# Patient Record
Sex: Female | Born: 1945 | Race: White | Hispanic: No | Smoking: Never smoker
Health system: Southern US, Community
[De-identification: ages and names within clinical notes are randomized; demographics above are authoritative.]

## PROBLEM LIST (undated history)

## (undated) DIAGNOSIS — E119 Type 2 diabetes mellitus without complications: Secondary | ICD-10-CM

## (undated) DIAGNOSIS — I10 Essential (primary) hypertension: Secondary | ICD-10-CM

## (undated) DIAGNOSIS — M1990 Unspecified osteoarthritis, unspecified site: Secondary | ICD-10-CM

## (undated) DIAGNOSIS — E785 Hyperlipidemia, unspecified: Secondary | ICD-10-CM

## (undated) HISTORY — DX: Essential (primary) hypertension: I10

## (undated) HISTORY — PX: OTHER SURGICAL HISTORY: SHX169

---

## 2000-08-06 ENCOUNTER — Encounter: Payer: Self-pay | Admitting: Emergency Medicine

## 2000-08-06 ENCOUNTER — Emergency Department (HOSPITAL_COMMUNITY): Admission: EM | Admit: 2000-08-06 | Discharge: 2000-08-06 | Payer: Self-pay

## 2005-09-02 LAB — CONVERTED CEMR LAB: Pap Smear: NORMAL

## 2008-11-18 ENCOUNTER — Encounter: Payer: Self-pay | Admitting: Family Medicine

## 2008-12-12 ENCOUNTER — Ambulatory Visit: Payer: Self-pay | Admitting: Family Medicine

## 2008-12-12 DIAGNOSIS — I10 Essential (primary) hypertension: Secondary | ICD-10-CM | POA: Insufficient documentation

## 2008-12-12 DIAGNOSIS — R9389 Abnormal findings on diagnostic imaging of other specified body structures: Secondary | ICD-10-CM

## 2008-12-13 ENCOUNTER — Telehealth: Payer: Self-pay | Admitting: Family Medicine

## 2008-12-19 ENCOUNTER — Encounter: Admission: RE | Admit: 2008-12-19 | Discharge: 2008-12-19 | Payer: Self-pay | Admitting: Family Medicine

## 2009-05-03 ENCOUNTER — Telehealth: Payer: Self-pay | Admitting: Family Medicine

## 2009-07-20 ENCOUNTER — Telehealth: Payer: Self-pay | Admitting: Family Medicine

## 2010-03-02 ENCOUNTER — Telehealth: Payer: Self-pay | Admitting: Family Medicine

## 2010-04-04 ENCOUNTER — Telehealth: Payer: Self-pay | Admitting: Family Medicine

## 2010-04-20 ENCOUNTER — Telehealth: Payer: Self-pay | Admitting: Family Medicine

## 2010-07-17 ENCOUNTER — Ambulatory Visit: Payer: Self-pay | Admitting: Family Medicine

## 2010-08-10 ENCOUNTER — Telehealth: Payer: Self-pay | Admitting: Family Medicine

## 2010-10-02 NOTE — Progress Notes (Signed)
Summary: Pt requesting RX, pt needs OV or new [facility]oug[facility] in [place], [place]  Phone Note Call from Patient   Caller: Patient Call For: Evelena Peat MD Summary of Call: VM from pt C/O congestion, ears stopped up, denies fever.  Concerned this will turn into sinus infection again.  Pt was with sick greandson last week who was on antibiotics.  Pt works in [place], requesting suggestions, Z pack. Dionisio Paschal [place] Initial call taken by: Sid Falcon LPN,  April 20, 2010 4:05 PM  Follow-up for Phone Call        may refil Z-max (Z-pack) take as directed but pt must have f/u before further meds -not seen in > one year. Follow-up by: Evelena Peat MD,  April 20, 2010 5:29 PM  Additional Follow-up for Phone Call Additional follow up Details #1::        Rx sent, per Dr Caryl Never, will fill this time, need to be seen next time in office, or get a Jeremih Dearmas in [facility] Additional Follow-up by: Sid Falcon LPN,  April 20, 2010 5:34 PM    New/Updated Medications: ZITHROMAX 1 GM PACK (AZITHROMYCIN) use as directed Prescriptions: ZITHROMAX 1 GM PACK (AZITHROMYCIN) use as directed  #1 x 0   Entered by:   Sid Falcon LPN   Authorized by:   Evelena Peat MD   Signed by:   Sid Falcon LPN on 04/54/0981   Method used:   Electronically to        [id number]* (retail)       [address]       [place], [place]  [address]       Ph: [phone]       Fax: [phone]   RxID:   [id number]

## 2010-10-02 NOTE — Progress Notes (Signed)
Summary: new med refill - when avalike dc  Phone Note Call from Patient Call back at Home Phone [phone]   Caller: vm 4:54 Summary of Call: The new medicine worked fine.  3 mo supply to [place], [facility] as in file.  Please have Harriett Sine call me back.  Calling to confirm that I'm going to continue with it.    Initial call taken by: Rudy Jew, RN,  April 04, 2010 5:15 PM  Follow-up for Phone Call        OK to refill Losartan HCTZ.  She does need f/u as not seen here in over one year.  OK to refill once. Follow-up by: Evelena Peat MD,  April 04, 2010 5:20 PM  Additional Follow-up for Phone Call Additional follow up Details #1::        Phone Call Completed Additional Follow-up by: Rudy Jew, RN,  April 04, 2010 5:34 PM

## 2010-10-02 NOTE — Progress Notes (Signed)
  Phone Note Call from Patient   Caller: Patient Call For: Evelena Peat MD Summary of Call: [phone] Asking for return call. Confimed dose of meds. Initial call taken by: [facility] CMA AAMA,  August 10, 2010 1:42 PM

## 2010-10-02 NOTE — Progress Notes (Signed)
Summary: [name] discontinued  Phone Note Call from Patient Call back at [facility] Phone [phone]   Caller: Patient Call For: Joanne Peat MD Summary of Call: Pt pharmacy will no longer be able to fill Avalide, discontinued. What else should she take? Phamacy in [place], [place] Initial call taken by: Sid Falcon LPN,  March 02, 3085 2:50 PM    New/Updated Medications: LOSARTAN POTASSIUM-HCTZ 100-12.5 MG TABS (LOSARTAN POTASSIUM-HCTZ) one tab daily Prescriptions: LOSARTAN POTASSIUM-HCTZ 100-12.5 MG TABS (LOSARTAN POTASSIUM-HCTZ) one tab daily  #30 x 3   Entered by:   Sid Falcon LPN   Authorized by:   Joanne Peat MD   Signed by:   Sid Falcon LPN on 57/84/6962   Method used:   Electronically to        [facility] [id number]* (retail)       [address]       [place], [place]  [address]       Ph: [phone]       Fax: [phone]   RxID:   [id number]

## 2010-10-02 NOTE — Assessment & Plan Note (Signed)
Summary: MED CK / REFILL // RS/   Vital Signs:  Patient profile:   65 year old female Weight:      276 pounds Temp:     97.7 degrees F oral BP sitting:   162 / 98  (left arm) Cuff size:   large  Vitals Entered By: Sid Falcon LPN (July 17, 2010 10:54 AM)  Serial Vital Signs/Assessments:  Time      Position  BP       Pulse  Resp  Temp     By                     156/98                         Evelena Peat MD   History of Present Illness: Patient her followup hypertension. Takes losartan HCTZ. Compliant with therapy. Has had some weight gain. Poor dietary compliance. Not exercising. Plans to start Weight Watchers soon.  Hypertension History:      She denies headache, chest pain, palpitations, dyspnea with exertion, orthopnea, PND, peripheral edema, visual symptoms, neurologic problems, syncope, and side effects from treatment.  She notes no problems with any antihypertensive medication side effects.        Positive major cardiovascular risk factors include female age 61 years old or older and hypertension.  Negative major cardiovascular risk factors include non-tobacco-user status.     Allergies (verified): 1)  Penicillin V Potassium (Penicillin V Potassium)  Review of Systems       The patient complains of weight gain.  The patient denies chest pain, syncope, peripheral edema, headaches, and abdominal pain.    Physical Exam  General:  Well-developed,well-nourished,in no acute distress; alert,appropriate and cooperative throughout examination Mouth:  Oral mucosa and oropharynx without lesions or exudates.  Teeth in good repair. Neck:  No deformities, masses, or tenderness noted. Lungs:  Normal respiratory effort, chest expands symmetrically. Lungs are clear to auscultation, no crackles or wheezes. Heart:  normal rate and regular rhythm.   Extremities:  no edema   Impression & Recommendations:  Problem # 1:  HYPERTENSION (ICD-401.9) Assessment Deteriorated long  discussion with patient. Discussed additional medication at this point she wishes to work on weight loss and reassess within the next month or 2. She will start regular walking program Her updated medication list for this problem includes:    Losartan Potassium-hctz 100-12.5 Mg Tabs (Losartan potassium-hctz) ..... One tab daily  Complete Medication List: 1)  Losartan Potassium-hctz 100-12.5 Mg Tabs (Losartan potassium-hctz) .... One tab daily  Hypertension Assessment/Plan:      The patient's hypertensive risk group is category B: At least one risk factor (excluding diabetes) with no target organ damage.  Today's blood pressure is 162/98.    Patient Instructions: 1)  It is important that you exercise reguarly at least 20 minutes 5 times a week. If you develop chest pain, have severe difficulty breathing, or feel very tired, stop exercising immediately and seek medical attention.  2)  You need to lose weight. Consider a lower calorie diet and regular exercise.  3)  Check your  Blood Pressure regularly . If it is above:140/90   you should make an appointment. 4)  Schedule complete physical examination within the next couple of months Prescriptions: LOSARTAN POTASSIUM-HCTZ 100-12.5 MG TABS (LOSARTAN POTASSIUM-HCTZ) one tab daily  #30 x 3   Entered and Authorized by:   Evelena Peat MD   Signed by:  Evelena Peat MD on 07/17/2010   Method used:   Electronically to        [id number]* (retail)       [address]       [place], [place]  [address]       Ph: [phone]       Fax: [phone]   RxID:   [id number]    Orders Added: 1)  Est. Patient Level III [[address]]

## 2010-10-07 IMAGING — US US RENAL
1 series · 14 of 25 positions shown · non-contrast
Comparison: None. Prior CT scan in [place] is not available for
comparison.

CLINICAL DATA: Evaluate right renal cyst.

RENAL/URINARY TRACT ULTRASOUND COMPLETE

[Series 1: us renal · 0.32mm/px · 14 of 42 slices shown]
[im 1/42]
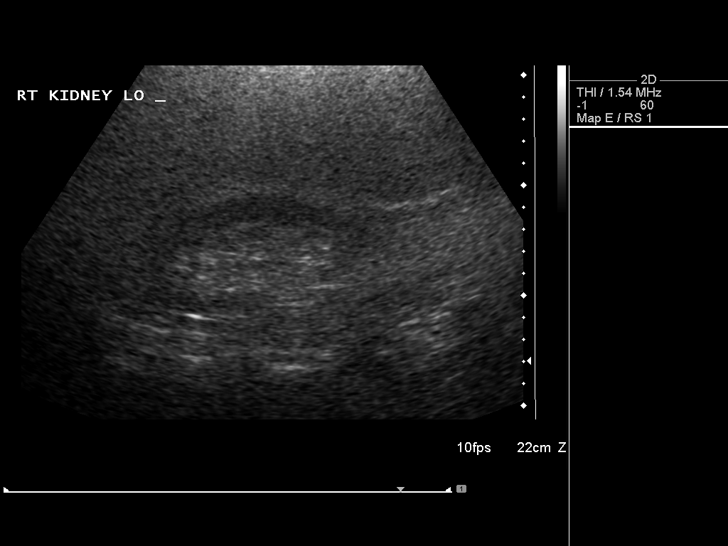
[im 4/42]
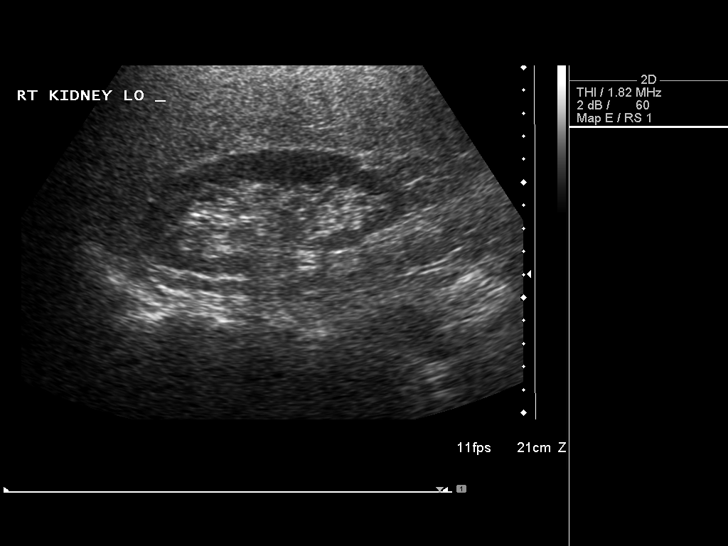
[im 7/42]
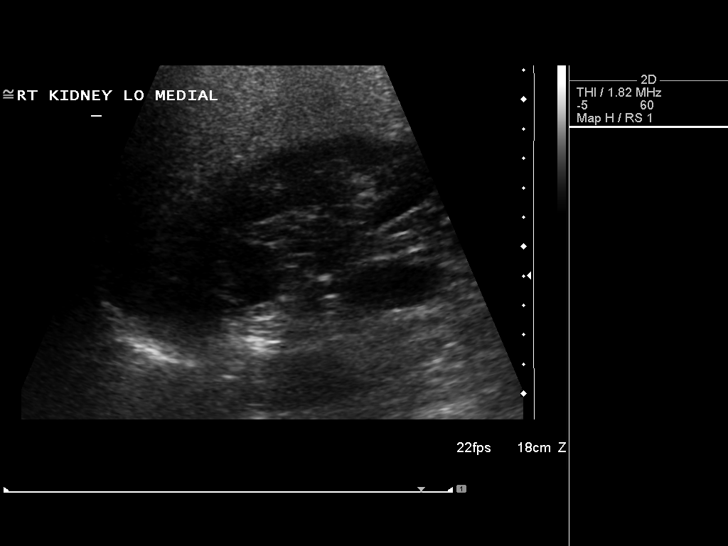
[im 11/42]
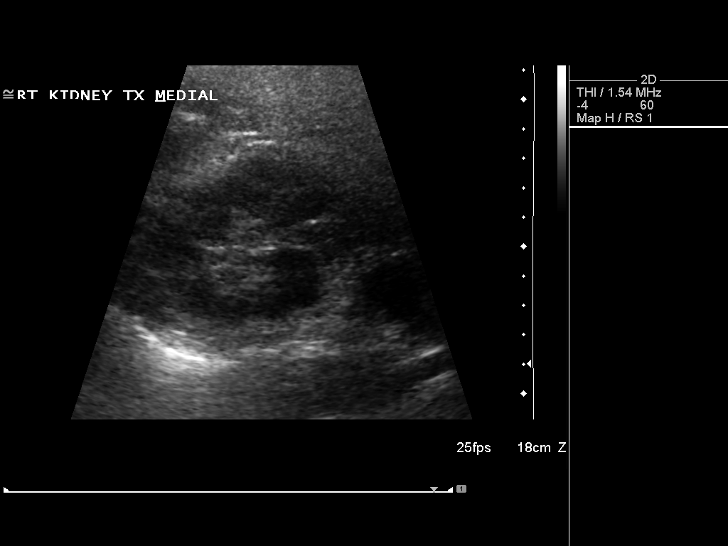
[im 14/42]
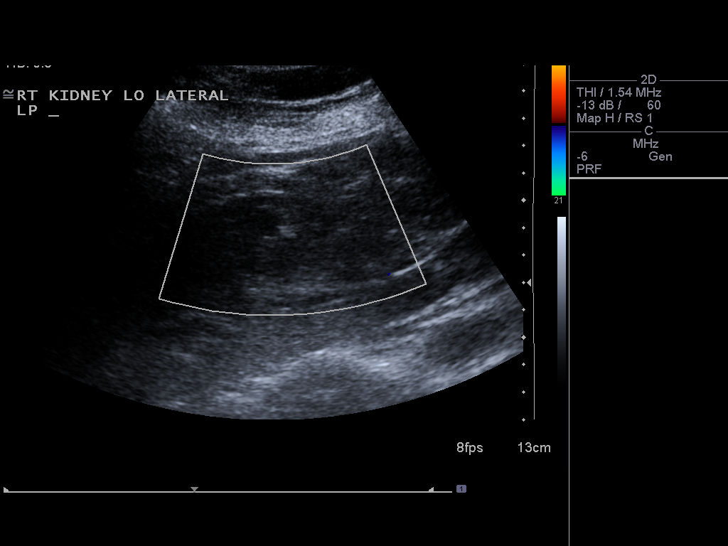
[im 16/42]
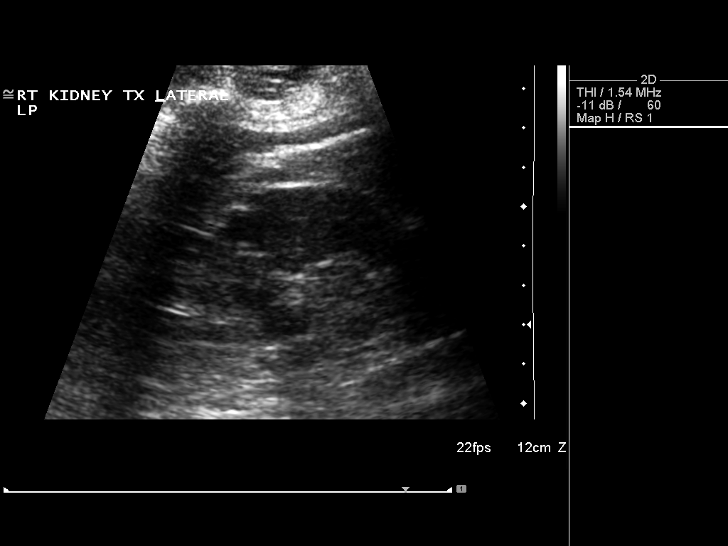
[im 19/42]
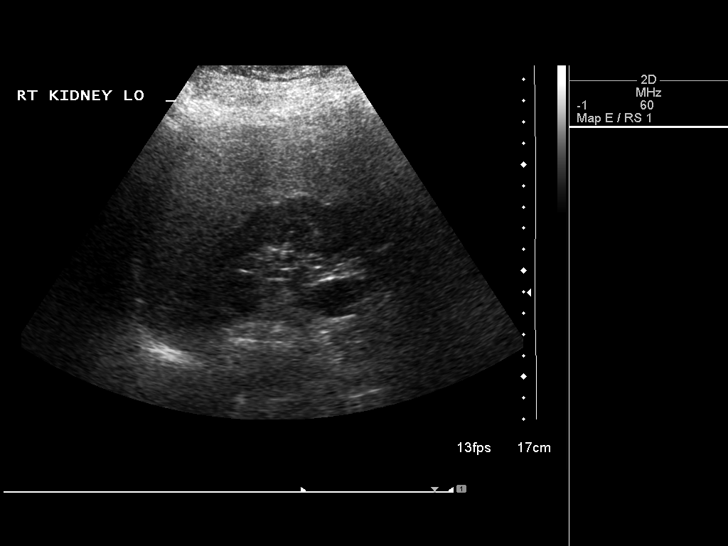
[im 23/42]
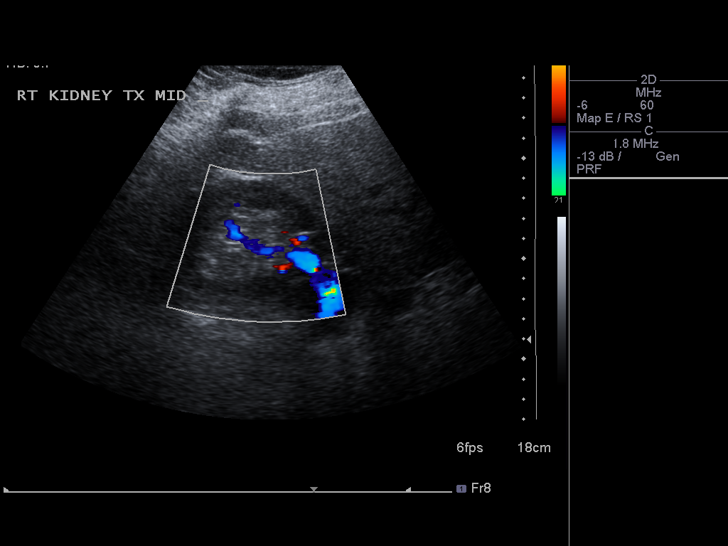
[im 26/42]
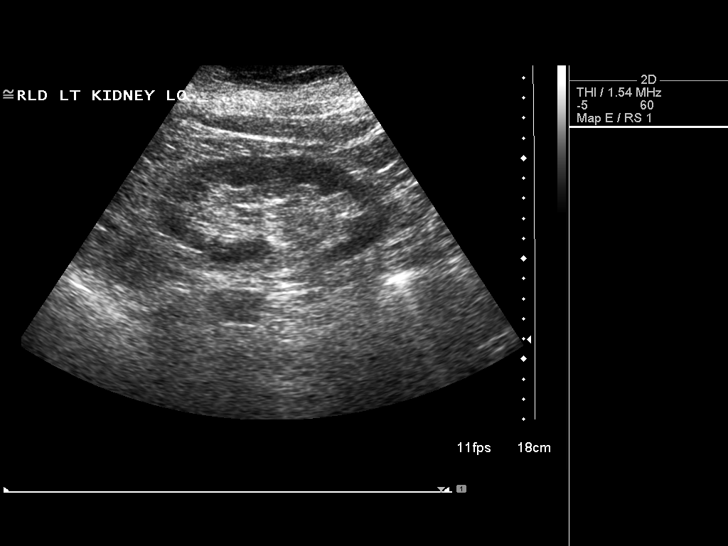
[im 28/42]
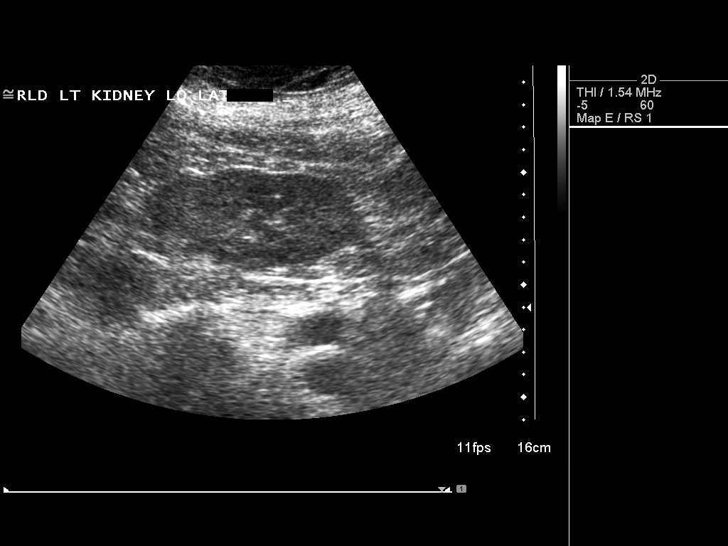
[im 31/42]
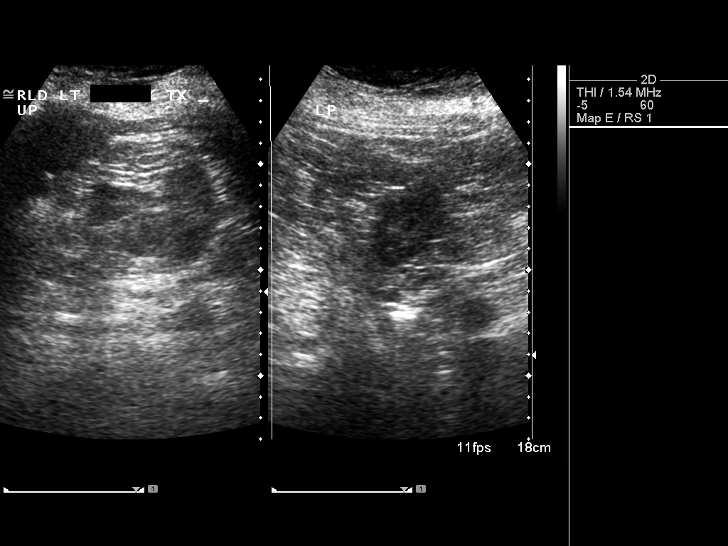
[im 35/42]
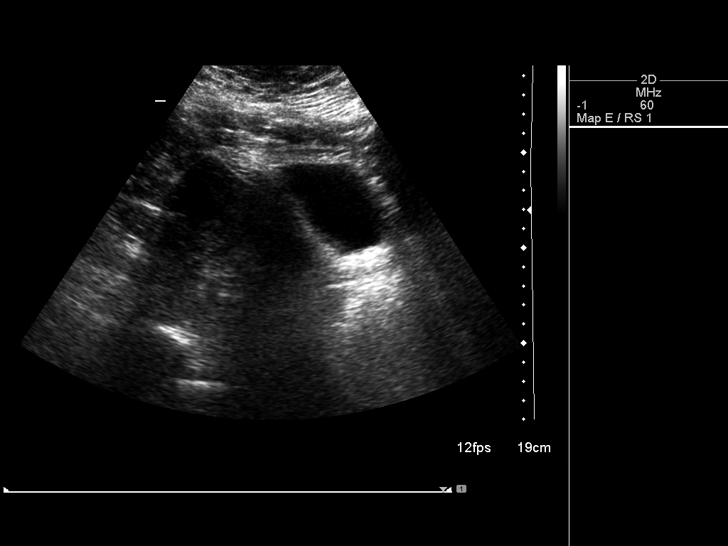
[im 38/42]
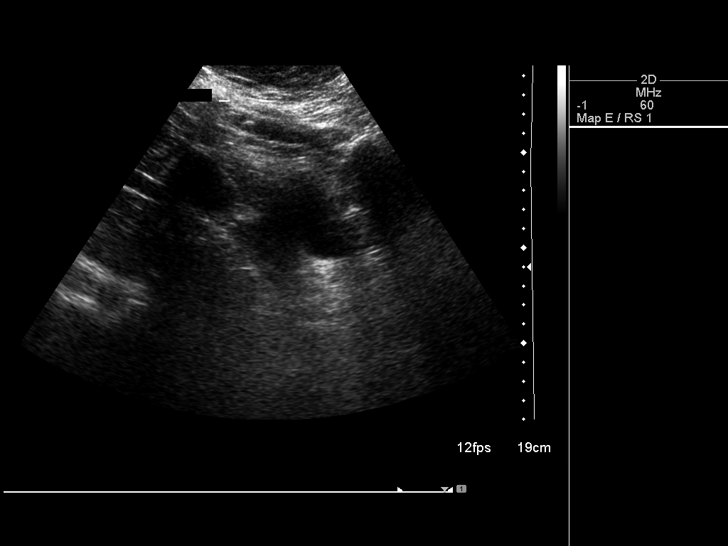
[im 42/42]
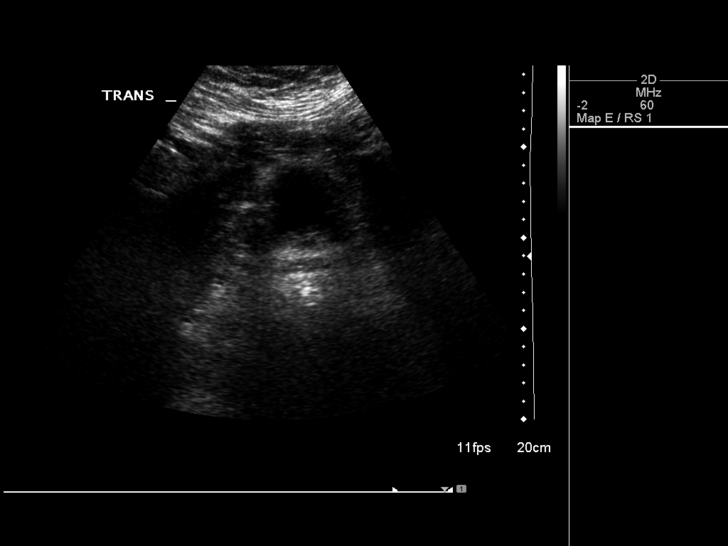

[14 of 25 positions shown; findings below may reference images not displayed]

FINDINGS: Right Kidney:  11.2 cm in length.  There is no hydronephrosis.
Right upper pole cyst measures 16 x 20 mm.  This is not optimally
visualized due to patient size.  There is also a 9 x 11 mm cyst on
the right lower pole.

Left Kidney:  11.6 cm.  There is no hydronephrosis or mass.

Bladder:  Partially full and no significant abnormality.
IMPRESSION: There appear to be two simple cysts  in the right kidney.  These
are not optimally imaged due to patient size.  There is no
hydronephrosis.

## 2010-11-23 ENCOUNTER — Telehealth: Payer: Self-pay | Admitting: *Deleted

## 2010-11-23 MED ORDER — LOSARTAN POTASSIUM-HCTZ 100-12.5 MG PO TABS: 1.0000 | ORAL_TABLET | Freq: Every day | ORAL | Status: DC

## 2010-11-23 NOTE — Telephone Encounter (Signed)
Pt called for refill, has CPX scheduled for next week, filled Losartan X 1 year, pt aware

## 2010-12-27 ENCOUNTER — Other Ambulatory Visit: Payer: Self-pay

## 2011-01-03 ENCOUNTER — Encounter: Payer: Self-pay | Admitting: Family Medicine

## 2011-01-04 ENCOUNTER — Encounter: Payer: Self-pay | Admitting: Family Medicine

## 2012-01-02 ENCOUNTER — Telehealth: Payer: Self-pay | Admitting: Family Medicine

## 2012-01-02 MED ORDER — LOSARTAN POTASSIUM-HCTZ 100-12.5 MG PO TABS: 1.0000 | ORAL_TABLET | Freq: Every day | ORAL | Status: DC

## 2012-01-02 NOTE — Telephone Encounter (Signed)
losartan-hydrochlorothiazide (HYZAAR) 100-12.5 MG per tablet   [id number] - [place], [place] - [address] AT [facility] [address]

## 2012-02-13 ENCOUNTER — Encounter: Payer: Self-pay | Admitting: Family Medicine

## 2012-02-17 ENCOUNTER — Other Ambulatory Visit: Payer: Self-pay

## 2012-02-24 ENCOUNTER — Encounter: Payer: Self-pay | Admitting: Family Medicine

## 2012-03-10 ENCOUNTER — Other Ambulatory Visit: Payer: Self-pay

## 2012-03-16 ENCOUNTER — Encounter: Payer: Self-pay | Admitting: Family Medicine

## 2012-04-09 ENCOUNTER — Other Ambulatory Visit: Payer: Self-pay | Admitting: Family Medicine

## 2012-04-09 MED ORDER — LOSARTAN POTASSIUM-HCTZ 100-12.5 MG PO TABS: 1.0000 | ORAL_TABLET | Freq: Every day | ORAL | Status: DC

## 2012-04-09 NOTE — Telephone Encounter (Signed)
Pt needs new rx sent to [facility] [place] for losartan-hctz 100-12.5mg . Pt no longer use [facility] in [place]

## 2012-05-13 ENCOUNTER — Other Ambulatory Visit: Payer: Self-pay

## 2012-05-20 ENCOUNTER — Encounter: Payer: Self-pay | Admitting: Family Medicine

## 2012-09-14 ENCOUNTER — Other Ambulatory Visit: Payer: Self-pay

## 2012-09-21 ENCOUNTER — Encounter: Payer: Self-pay | Admitting: Family Medicine

## 2012-10-13 ENCOUNTER — Other Ambulatory Visit: Payer: Self-pay

## 2012-10-20 ENCOUNTER — Encounter: Payer: Self-pay | Admitting: Family Medicine

## 2012-10-28 ENCOUNTER — Other Ambulatory Visit: Payer: Self-pay

## 2012-11-04 ENCOUNTER — Encounter: Payer: Self-pay | Admitting: Family Medicine

## 2012-12-22 ENCOUNTER — Other Ambulatory Visit: Payer: Self-pay

## 2012-12-29 ENCOUNTER — Encounter: Payer: Self-pay | Admitting: Family Medicine

## 2013-01-20 ENCOUNTER — Other Ambulatory Visit: Payer: Self-pay

## 2013-01-21 ENCOUNTER — Other Ambulatory Visit: Payer: Self-pay

## 2013-01-27 ENCOUNTER — Encounter: Payer: Self-pay | Admitting: Family Medicine

## 2013-02-02 ENCOUNTER — Telehealth: Payer: Self-pay | Admitting: Family Medicine

## 2013-02-02 MED ORDER — LOSARTAN POTASSIUM-HCTZ 100-12.5 MG PO TABS: 1.0000 | ORAL_TABLET | Freq: Every day | ORAL | Status: DC

## 2013-02-02 NOTE — Telephone Encounter (Signed)
PT called and stated that she will be running out of her losartan-hydrochlorothiazide (HYZAAR) 100-12.5 MG per tablet, prior to her CPX next month. She would like it sent to [facility] in [place], please assist.

## 2013-02-02 NOTE — Telephone Encounter (Signed)
Left detailed message Rx refill was sent to pharmacy.

## 2013-02-03 ENCOUNTER — Other Ambulatory Visit: Payer: Self-pay

## 2013-02-10 ENCOUNTER — Encounter: Payer: Self-pay | Admitting: Family Medicine

## 2013-03-15 ENCOUNTER — Telehealth: Payer: Self-pay | Admitting: Family Medicine

## 2013-03-15 ENCOUNTER — Other Ambulatory Visit (INDEPENDENT_AMBULATORY_CARE_PROVIDER_SITE_OTHER): Payer: BC Managed Care – PPO

## 2013-03-15 DIAGNOSIS — Z0000 Encounter for general adult medical examination without abnormal findings: Secondary | ICD-10-CM

## 2013-03-15 DIAGNOSIS — R7989 Other specified abnormal findings of blood chemistry: Secondary | ICD-10-CM

## 2013-03-15 LAB — CBC WITH DIFFERENTIAL/PLATELET
Basophils Relative: 1 % (ref 0.0–3.0)
Eosinophils Relative: 2.9 % (ref 0.0–5.0)
HCT: 44.4 % (ref 36.0–46.0)
Lymphs Abs: 3 10*3/uL (ref 0.7–4.0)
MCV: 94.9 fl (ref 78.0–100.0)
Monocytes Absolute: 0.7 10*3/uL (ref 0.1–1.0)
Monocytes Relative: 9.8 % (ref 3.0–12.0)
RBC: 4.67 Mil/uL (ref 3.87–5.11)
WBC: 7.6 10*3/uL (ref 4.5–10.5)

## 2013-03-15 LAB — HEPATIC FUNCTION PANEL
ALT: 33 U/L (ref 0–35)
AST: 23 U/L (ref 0–37)
Bilirubin, Direct: 0.1 mg/dL (ref 0.0–0.3)
Total Protein: 7.3 g/dL (ref 6.0–8.3)

## 2013-03-15 LAB — POCT URINALYSIS DIPSTICK
Blood, UA: NEGATIVE
Ketones, UA: NEGATIVE
Leukocytes, UA: NEGATIVE
Protein, UA: NEGATIVE
pH, UA: 5.5

## 2013-03-15 LAB — BASIC METABOLIC PANEL
BUN: 14 mg/dL (ref 6–23)
Chloride: 102 mEq/L (ref 96–112)
GFR: 74.25 mL/min (ref >60.00)
Potassium: 4.2 mEq/L (ref 3.5–5.1)
Sodium: 139 mEq/L (ref 135–145)

## 2013-03-15 LAB — LDL CHOLESTEROL, DIRECT: Direct LDL: 229.1 mg/dL

## 2013-03-15 LAB — LIPID PANEL
HDL: 38 mg/dL — ABNORMAL LOW (ref >39.00)
Total CHOL/HDL Ratio: 8
VLDL: 46.2 mg/dL — ABNORMAL HIGH (ref 0.0–40.0)

## 2013-03-15 MED ORDER — LOSARTAN POTASSIUM-HCTZ 100-12.5 MG PO TABS: 1.0000 | ORAL_TABLET | Freq: Every day | ORAL | Status: DC

## 2013-03-15 NOTE — Telephone Encounter (Signed)
Pt needs refill of losartan-hydrochlorothiazide (HYZAAR) 100-12.5 MG per tablet. 30 day supply/ 1/ day [employer].

## 2013-03-22 ENCOUNTER — Ambulatory Visit (INDEPENDENT_AMBULATORY_CARE_PROVIDER_SITE_OTHER): Payer: BC Managed Care – PPO | Admitting: Family Medicine

## 2013-03-22 ENCOUNTER — Encounter: Payer: Self-pay | Admitting: Family Medicine

## 2013-03-22 VITALS — BP 140/72 | HR 108 | Temp 97.9°F | Ht 65.0 in | Wt 276.0 lb

## 2013-03-22 DIAGNOSIS — I10 Essential (primary) hypertension: Secondary | ICD-10-CM

## 2013-03-22 DIAGNOSIS — R739 Hyperglycemia, unspecified: Secondary | ICD-10-CM

## 2013-03-22 DIAGNOSIS — R7309 Other abnormal glucose: Secondary | ICD-10-CM

## 2013-03-22 DIAGNOSIS — E785 Hyperlipidemia, unspecified: Secondary | ICD-10-CM

## 2013-03-22 DIAGNOSIS — Z0000 Encounter for general adult medical examination without abnormal findings: Secondary | ICD-10-CM

## 2013-03-22 DIAGNOSIS — Z23 Encounter for immunization: Secondary | ICD-10-CM

## 2013-03-22 MED ORDER — ATORVASTATIN CALCIUM 20 MG PO TABS: 20.0000 mg | ORAL_TABLET | Freq: Every day | ORAL | Status: DC

## 2013-03-22 NOTE — Patient Instructions (Addendum)
Schedule mammogram Work on weight loss Lets plan follow up in 6 weeks and will get fasting glucose and lipids at that time. Check on insurance coverage for shingles vaccine.

## 2013-03-22 NOTE — Progress Notes (Signed)
  Subjective:    Patient ID: Joanne Mendoza, female    DOB: [birth year], 67 y.o.   MRN: [id number]  HPI  Here for complete physical. Pt not seen here in few years. Hypertension on losartan hctz.  BP stable Not exercising.  Weight stable.  She is nonsmoker Needs PVX, shingles, Tdap, colonoscopy. Also needs follow up pap.  She plans to see gyn Needs repeat mammogram.  No polyuria or polydipsia.  Was doing Weight Watchers in [place] and hopes to resume soon.  Past Medical History  Diagnosis Date  . Hypertension    Past Surgical History  Procedure Laterality Date  . Cesarean section      x 2     reports that she has never smoked. She does not have any smokeless tobacco history on file. She reports that she does not drink alcohol. Her drug history is not on file. family history includes Alcohol abuse in her father; Hyperlipidemia in an unspecified family member; and Hypertension in her mother. Allergies  Allergen Reactions  . Penicillins     REACTION: hives     Review of Systems  Constitutional: Positive for fatigue. Negative for fever, activity change, appetite change and unexpected weight change.  HENT: Negative for hearing loss, ear pain, sore throat and trouble swallowing.   Eyes: Negative for visual disturbance.  Respiratory: Negative for cough and shortness of breath.   Cardiovascular: Negative for chest pain and palpitations.  Gastrointestinal: Negative for abdominal pain, diarrhea, constipation and blood in stool.  Endocrine: Negative for polydipsia and polyuria.  Genitourinary: Negative for dysuria and hematuria.  Musculoskeletal: Positive for arthralgias. Negative for myalgias and back pain.  Skin: Negative for rash.  Neurological: Negative for dizziness, syncope and headaches.  Hematological: Negative for adenopathy.  Psychiatric/Behavioral: Negative for confusion and dysphoric mood.       Objective:   Physical Exam  Constitutional: She is oriented to person,  place, and time. She appears well-developed and well-nourished.  HENT:  Head: Normocephalic and atraumatic.  Mouth/Throat: Oropharynx is clear and moist.  Eyes: EOM are normal. Pupils are equal, round, and reactive to light.  Neck: Normal range of motion. Neck supple. No thyromegaly present.  Cardiovascular: Normal rate, regular rhythm and normal heart sounds.   No murmur heard. Pulmonary/Chest: Breath sounds normal. No respiratory distress. She has no wheezes. She has no rales.  Abdominal: Soft. Bowel sounds are normal. She exhibits no distension and no mass. There is no tenderness. There is no rebound and no guarding.  Genitourinary:  Per gyn   Musculoskeletal: Normal range of motion. She exhibits no edema.  Lymphadenopathy:    She has no cervical adenopathy.  Neurological: She is alert and oriented to person, place, and time. She displays normal reflexes. No cranial nerve deficit.  Skin: No rash noted.  Psychiatric: She has a normal mood and affect. Her behavior is normal. Judgment and thought content normal.          Assessment & Plan:  Complete physical. Tdap and PVX given.  She will check on coverage for shingles vaccine. Schedule colonoscopy.  Set up mammogram.  She will set up to see gyn.  Hyperlipidemia.  Severe.  Start Lipitor 20 mg daily and repeat lipids 6 weeks.  Hyperglycemia.  ?new onset Type 2 diabetes.  Work on weight loss and repeat fasting glucose in 6 weeks. Consider metformin then. Consider A1C then if still up.

## 2013-03-22 NOTE — Addendum Note (Signed)
Addended by: Shelby Dubin E on: 03/22/2013 01:09 PM   Modules accepted: Orders

## 2013-04-02 ENCOUNTER — Encounter: Payer: Self-pay | Admitting: Internal Medicine

## 2013-04-15 ENCOUNTER — Other Ambulatory Visit: Payer: Self-pay | Admitting: Family Medicine

## 2013-04-30 ENCOUNTER — Telehealth: Payer: Self-pay | Admitting: Family Medicine

## 2013-04-30 NOTE — Telephone Encounter (Signed)
Patient's last OV note said repeat fasting glucose and lipids in 6wks, but I see someone scheduled her for 6wk f/u appt with no labs. Can I sched her for the labs, then to see you a week later to go over results? Order for BMP is in computer, but I did not see a lipid order. Please advise.

## 2013-04-30 NOTE — Telephone Encounter (Signed)
Go ahead and add A1C, lipid, hepatic, BMP and get prior to her visit

## 2013-05-05 ENCOUNTER — Ambulatory Visit: Payer: BC Managed Care – PPO | Admitting: Family Medicine

## 2013-05-13 ENCOUNTER — Other Ambulatory Visit (INDEPENDENT_AMBULATORY_CARE_PROVIDER_SITE_OTHER): Payer: Medicare Other

## 2013-05-13 ENCOUNTER — Ambulatory Visit: Payer: BC Managed Care – PPO | Admitting: Family Medicine

## 2013-05-13 DIAGNOSIS — E1059 Type 1 diabetes mellitus with other circulatory complications: Secondary | ICD-10-CM

## 2013-05-13 DIAGNOSIS — E785 Hyperlipidemia, unspecified: Secondary | ICD-10-CM

## 2013-05-13 LAB — BASIC METABOLIC PANEL WITH GFR
BUN: 16 mg/dL (ref 6–23)
CO2: 27 meq/L (ref 19–32)
Calcium: 9 mg/dL (ref 8.4–10.5)
Chloride: 104 meq/L (ref 96–112)
Creatinine, Ser: 0.7 mg/dL (ref 0.4–1.2)
GFR: 92.11 mL/min (ref >60.00)
Glucose, Bld: 154 mg/dL — ABNORMAL HIGH (ref 70–99)
Potassium: 4.1 meq/L (ref 3.5–5.1)
Sodium: 138 meq/L (ref 135–145)

## 2013-05-13 LAB — HEPATIC FUNCTION PANEL
ALT: 29 U/L (ref 0–35)
AST: 21 U/L (ref 0–37)
Albumin: 3.6 g/dL (ref 3.5–5.2)
Alkaline Phosphatase: 72 U/L (ref 39–117)
Bilirubin, Direct: 0.1 mg/dL (ref 0.0–0.3)
Total Bilirubin: 1.3 mg/dL — ABNORMAL HIGH (ref 0.3–1.2)
Total Protein: 6.8 g/dL (ref 6.0–8.3)

## 2013-05-13 LAB — LIPID PANEL
Cholesterol: 192 mg/dL (ref 0–200)
HDL: 41.5 mg/dL (ref >39.00)
LDL Cholesterol: 122 mg/dL — ABNORMAL HIGH (ref 0–99)
Total CHOL/HDL Ratio: 5
Triglycerides: 141 mg/dL (ref 0.0–149.0)
VLDL: 28.2 mg/dL (ref 0.0–40.0)

## 2013-05-13 LAB — HEMOGLOBIN A1C: Hgb A1c MFr Bld: 7.1 % — ABNORMAL HIGH (ref 4.6–6.5)

## 2013-05-20 ENCOUNTER — Encounter: Payer: Self-pay | Admitting: Family Medicine

## 2013-05-20 ENCOUNTER — Ambulatory Visit (INDEPENDENT_AMBULATORY_CARE_PROVIDER_SITE_OTHER): Payer: Medicare Other | Admitting: Family Medicine

## 2013-05-20 VITALS — BP 136/78 | HR 64 | Temp 97.6°F | Wt 280.0 lb

## 2013-05-20 DIAGNOSIS — E1165 Type 2 diabetes mellitus with hyperglycemia: Secondary | ICD-10-CM | POA: Insufficient documentation

## 2013-05-20 DIAGNOSIS — Z23 Encounter for immunization: Secondary | ICD-10-CM

## 2013-05-20 DIAGNOSIS — Z2911 Encounter for prophylactic immunotherapy for respiratory syncytial virus (RSV): Secondary | ICD-10-CM

## 2013-05-20 DIAGNOSIS — E119 Type 2 diabetes mellitus without complications: Secondary | ICD-10-CM

## 2013-05-20 DIAGNOSIS — E785 Hyperlipidemia, unspecified: Secondary | ICD-10-CM

## 2013-05-20 DIAGNOSIS — I10 Essential (primary) hypertension: Secondary | ICD-10-CM

## 2013-05-20 NOTE — Patient Instructions (Addendum)
Type 2 Diabetes Mellitus, Adult Type 2 diabetes mellitus, often simply referred to as type 2 diabetes, is a long-lasting (chronic) disease. In type 2 diabetes, the pancreas does not make enough insulin (a hormone), the cells are less responsive to the insulin that is made (insulin resistance), or both. Normally, insulin moves sugars from food into the tissue cells. The tissue cells use the sugars for energy. The lack of insulin or the lack of normal response to insulin causes excess sugars to build up in the blood instead of going into the tissue cells. As a result, high blood sugar (hyperglycemia) develops. The effect of high sugar (glucose) levels can cause many complications. Type 2 diabetes was also previously called adult-onset diabetes but it can occur at any age.  RISK FACTORS  A person is predisposed to developing type 2 diabetes if someone in the family has the disease and also has one or more of the following primary risk factors:  Overweight.  An inactive lifestyle.  A history of consistently eating high-calorie foods. Maintaining a normal weight and regular physical activity can reduce the chance of developing type 2 diabetes. SYMPTOMS  A person with type 2 diabetes may not show symptoms initially. The symptoms of type 2 diabetes appear slowly. The symptoms include:  Increased thirst (polydipsia).  Increased urination (polyuria).  Increased urination during the night (nocturia).  Weight loss. This weight loss may be rapid.  Frequent, recurring infections.  Tiredness (fatigue).  Weakness.  Vision changes, such as blurred vision.  Fruity smell to your breath.  Abdominal pain.  Nausea or vomiting.  Cuts or bruises which are slow to heal.  Tingling or numbness in the hands or feet. DIAGNOSIS Type 2 diabetes is frequently not diagnosed until complications of diabetes are present. Type 2 diabetes is diagnosed when symptoms or complications are present and when blood  glucose levels are increased. Your blood glucose level may be checked by one or more of the following blood tests:  A fasting blood glucose test. You will not be allowed to eat for at least 8 hours before a blood sample is taken.  A random blood glucose test. Your blood glucose is checked at any time of the day regardless of when you ate.  A hemoglobin A1c blood glucose test. A hemoglobin A1c test provides information about blood glucose control over the previous 3 months.  An oral glucose tolerance test (OGTT). Your blood glucose is measured after you have not eaten (fasted) for 2 hours and then after you drink a glucose-containing beverage. TREATMENT   You may need to take insulin or diabetes medicine daily to keep blood glucose levels in the desired range.  You will need to match insulin dosing with exercise and healthy food choices. The treatment goal is to maintain the before meal blood sugar (preprandial glucose) level at 70 130 mg/dL. HOME CARE INSTRUCTIONS   Have your hemoglobin A1c level checked twice a year.  Perform daily blood glucose monitoring as directed by your caregiver.  Monitor urine ketones when you are ill and as directed by your caregiver.  Take your diabetes medicine or insulin as directed by your caregiver to maintain your blood glucose levels in the desired range.  Never run out of diabetes medicine or insulin. It is needed every day.  Adjust insulin based on your intake of carbohydrates. Carbohydrates can raise blood glucose levels but need to be included in your diet. Carbohydrates provide vitamins, minerals, and fiber which are an essential part of   a healthy diet. Carbohydrates are found in fruits, vegetables, whole grains, dairy products, legumes, and foods containing added sugars.    Eat healthy foods. Alternate 3 meals with 3 snacks.  Lose weight if overweight.  Carry a medical alert card or wear your medical alert jewelry.  Carry a 15 gram  carbohydrate snack with you at all times to treat low blood glucose (hypoglycemia). Some examples of 15 gram carbohydrate snacks include:  Glucose tablets, 3 or 4   Glucose gel, 15 gram tube  Raisins, 2 tablespoons (24 grams)  Jelly beans, 6  Animal crackers, 8  Regular pop, 4 ounces (120 mL)  Gummy treats, 9  Recognize hypoglycemia. Hypoglycemia occurs with blood glucose levels of 70 mg/dL and below. The risk for hypoglycemia increases when fasting or skipping meals, during or after intense exercise, and during sleep. Hypoglycemia symptoms can include:  Tremors or shakes.  Decreased ability to concentrate.  Sweating.  Increased heart rate.  Headache.  Dry mouth.  Hunger.  Irritability.  Anxiety.  Restless sleep.  Altered speech or coordination.  Confusion.  Treat hypoglycemia promptly. If you are alert and able to safely swallow, follow the 15:15 rule:  Take 15 20 grams of rapid-acting glucose or carbohydrate. Rapid-acting options include glucose gel, glucose tablets, or 4 ounces (120 mL) of fruit juice, regular soda, or low fat milk.  Check your blood glucose level 15 minutes after taking the glucose.  Take 15 20 grams more of glucose if the repeat blood glucose level is still 70 mg/dL or below.  Eat a meal or snack within 1 hour once blood glucose levels return to normal.    Be alert to polyuria and polydipsia which are early signs of hyperglycemia. An early awareness of hyperglycemia allows for prompt treatment. Treat hyperglycemia as directed by your caregiver.  Engage in at least 150 minutes of moderate-intensity physical activity a week, spread over at least 3 days of the week or as directed by your caregiver. In addition, you should engage in resistance exercise at least 2 times a week or as directed by your caregiver.  Adjust your medicine and food intake as needed if you start a new exercise or sport.  Follow your sick day plan at any time you  are unable to eat or drink as usual.  Avoid tobacco use.  Limit alcohol intake to no more than 1 drink per day for nonpregnant women and 2 drinks per day for men. You should drink alcohol only when you are also eating food. Talk with your caregiver whether alcohol is safe for you. Tell your caregiver if you drink alcohol several times a week.  Follow up with your caregiver regularly.  Schedule an eye exam soon after the diagnosis of type 2 diabetes and then annually.  Perform daily skin and foot care. Examine your skin and feet daily for cuts, bruises, redness, nail problems, bleeding, blisters, or sores. A foot exam by a caregiver should be done annually.  Brush your teeth and gums at least twice a day and floss at least once a day. Follow up with your dentist regularly.  Share your diabetes management plan with your workplace or school.  Stay up-to-date with immunizations.  Learn to manage stress.  Obtain ongoing diabetes education and support as needed.  Participate in, or seek rehabilitation as needed to maintain or improve independence and quality of life. Request a physical or occupational therapy referral if you are having foot or hand numbness or difficulties with grooming,   dressing, eating, or physical activity. SEEK MEDICAL CARE IF:   You are unable to eat food or drink fluids for more than 6 hours.  You have nausea and vomiting for more than 6 hours.  Your blood glucose level is over 240 mg/dL.  There is a change in mental status.  You develop an additional serious illness.  You have diarrhea for more than 6 hours.  You have been sick or have had a fever for a couple of days and are not getting better.  You have pain during any physical activity.  SEEK IMMEDIATE MEDICAL CARE IF:  You have difficulty breathing.  You have moderate to large ketone levels. MAKE SURE YOU:  Understand these instructions.  Will watch your condition.  Will get help right away if  you are not doing well or get worse. Document Released: 08/19/2005 Document Revised: 05/13/2012 Document Reviewed: 03/17/2012 [employer] Patient Information 2014 [employer].  

## 2013-05-20 NOTE — Progress Notes (Signed)
  Subjective:    Patient ID: Joanne Mendoza, female    DOB: 09-Jan-1946, 67 y.o.   MRN: [id number]  HPI Patient seen for followup. She has history of obesity, hypertension, hyperlipidemia, and recently diagnosed type 2 diabetes. Recent labs revealed A1c 7.1%. She had severely elevated lipids and we started Lipitor and her lipids are greatly improved total cholesterol 192 and LDL 122.  She plans to start exercise program. She has joined a gym and has a Psychologist, educational.  She prefers weight loss and reassess diabetes versus additional medication  Patient requesting shingles vaccine. Also needs flu vaccine. She's had previous Pneumovax.  Past Medical History  Diagnosis Date  . Hypertension    Past Surgical History  Procedure Laterality Date  . Cesarean section      x 2     reports that she has never smoked. She does not have any smokeless tobacco history on file. She reports that she does not drink alcohol. Her drug history is not on file. family history includes Alcohol abuse in her father; Hyperlipidemia in an other family member; Hypertension in her mother. Allergies  Allergen Reactions  . Penicillins     REACTION: hives     Review of Systems  Constitutional: Negative for fatigue.  Eyes: Negative for visual disturbance.  Respiratory: Negative for cough, chest tightness, shortness of breath and wheezing.   Cardiovascular: Negative for chest pain, palpitations and leg swelling.  Gastrointestinal: Negative for abdominal pain.  Endocrine: Negative for polydipsia and polyuria.  Genitourinary: Negative for dysuria.  Neurological: Negative for dizziness, seizures, syncope, weakness, light-headedness and headaches.       Objective:   Physical Exam  Constitutional: She appears well-developed and well-nourished.  HENT:  Mouth/Throat: Oropharynx is clear and moist.  Neck: Neck supple. No thyromegaly present.  Cardiovascular: Normal rate and regular rhythm.   Pulmonary/Chest: Effort normal  and breath sounds normal. No respiratory distress. She has no wheezes. She has no rales.  Musculoskeletal: She exhibits no edema.  Skin: No rash noted.          Assessment & Plan:  #1 hypertension. Improved. Continue losartan HCTZ. Continue weight loss efforts #2 type 2 diabetes. Recently diagnosed. Lifestyle management discussed. We discussed possible metformin use and at this point she prefers lifestyle management and reassess A1c in 3 months #3 dyslipidemia. LDL still not to goal. We discussed possible titration of Lipitor but she prefers lifestyle management and recheck 3 months #4 health maintenance. Shingles vaccine and flu vaccine given. #5 obesity.  We discussed at some length strategies for weight loss.

## 2013-06-14 ENCOUNTER — Ambulatory Visit (AMBULATORY_SURGERY_CENTER): Payer: Self-pay

## 2013-06-14 VITALS — Ht 65.0 in | Wt 276.0 lb

## 2013-06-14 DIAGNOSIS — Z1211 Encounter for screening for malignant neoplasm of colon: Secondary | ICD-10-CM

## 2013-06-14 MED ORDER — SUPREP BOWEL PREP KIT 17.5-3.13-1.6 GM/177ML PO SOLN: 1.0000 | Freq: Once | ORAL | Status: DC

## 2013-06-15 ENCOUNTER — Encounter: Payer: Self-pay | Admitting: Internal Medicine

## 2013-06-16 ENCOUNTER — Encounter: Payer: BC Managed Care – PPO | Admitting: Internal Medicine

## 2013-06-28 ENCOUNTER — Encounter: Payer: Medicare Other | Admitting: Internal Medicine

## 2013-07-08 ENCOUNTER — Encounter: Payer: Medicare Other | Admitting: Internal Medicine

## 2013-08-02 ENCOUNTER — Encounter: Payer: Medicare Other | Admitting: Internal Medicine

## 2013-08-19 ENCOUNTER — Ambulatory Visit: Payer: Medicare Other | Admitting: Family Medicine

## 2013-09-07 ENCOUNTER — Encounter: Payer: Self-pay | Admitting: Internal Medicine

## 2013-09-07 ENCOUNTER — Ambulatory Visit (INDEPENDENT_AMBULATORY_CARE_PROVIDER_SITE_OTHER): Payer: Medicare Other | Admitting: Internal Medicine

## 2013-09-07 VITALS — BP 164/84 | HR 103 | Temp 98.1°F | Wt 272.0 lb

## 2013-09-07 DIAGNOSIS — I10 Essential (primary) hypertension: Secondary | ICD-10-CM

## 2013-09-07 DIAGNOSIS — IMO0001 Reserved for inherently not codable concepts without codable children: Secondary | ICD-10-CM

## 2013-09-07 DIAGNOSIS — J019 Acute sinusitis, unspecified: Secondary | ICD-10-CM

## 2013-09-07 DIAGNOSIS — H6590 Unspecified nonsuppurative otitis media, unspecified ear: Secondary | ICD-10-CM

## 2013-09-07 MED ORDER — CEFUROXIME AXETIL 500 MG PO TABS: 500.0000 mg | ORAL_TABLET | Freq: Two times a day (BID) | ORAL | Status: DC

## 2013-09-07 NOTE — Patient Instructions (Signed)
I think there is very little risk of allergic reaction  To the antibiotic based on your hx of PCN allergy. expect improvement in the next 3-5 days. Consider saline irrigations also.  To help in sinus infections.    Sinusitis Sinusitis is redness, soreness, and swelling (inflammation) of the paranasal sinuses. Paranasal sinuses are air pockets within the bones of your face (beneath the eyes, the middle of the forehead, or above the eyes). In healthy paranasal sinuses, mucus is able to drain out, and air is able to circulate through them by way of your nose. However, when your paranasal sinuses are inflamed, mucus and air can become trapped. This can allow bacteria and other germs to grow and cause infection. Sinusitis can develop quickly and last only a short time (acute) or continue over a long period (chronic). Sinusitis that lasts for more than 12 weeks is considered chronic.  CAUSES  Causes of sinusitis include:  Allergies.  Structural abnormalities, such as displacement of the cartilage that separates your nostrils (deviated septum), which can decrease the air flow through your nose and sinuses and affect sinus drainage.  Functional abnormalities, such as when the small hairs (cilia) that line your sinuses and help remove mucus do not work properly or are not present. SYMPTOMS  Symptoms of acute and chronic sinusitis are the same. The primary symptoms are pain and pressure around the affected sinuses. Other symptoms include:  Upper toothache.  Earache.  Headache.  Bad breath.  Decreased sense of smell and taste.  A cough, which worsens when you are lying flat.  Fatigue.  Fever.  Thick drainage from your nose, which often is green and may contain pus (purulent).  Swelling and warmth over the affected sinuses. DIAGNOSIS  Your caregiver will perform a physical exam. During the exam, your caregiver may:  Look in your nose for signs of abnormal growths in your nostrils (nasal  polyps).  Tap over the affected sinus to check for signs of infection.  View the inside of your sinuses (endoscopy) with a special imaging device with a light attached (endoscope), which is inserted into your sinuses. If your caregiver suspects that you have chronic sinusitis, one or more of the following tests may be recommended:  Allergy tests.  Nasal culture A sample of mucus is taken from your nose and sent to a lab and screened for bacteria.  Nasal cytology A sample of mucus is taken from your nose and examined by your caregiver to determine if your sinusitis is related to an allergy. TREATMENT  Most cases of acute sinusitis are related to a viral infection and will resolve on their own within 10 days. Sometimes medicines are prescribed to help relieve symptoms (pain medicine, decongestants, nasal steroid sprays, or saline sprays).  However, for sinusitis related to a bacterial infection, your caregiver will prescribe antibiotic medicines. These are medicines that will help kill the bacteria causing the infection.  Rarely, sinusitis is caused by a fungal infection. In theses cases, your caregiver will prescribe antifungal medicine. For some cases of chronic sinusitis, surgery is needed. Generally, these are cases in which sinusitis recurs more than 3 times per year, despite other treatments. HOME CARE INSTRUCTIONS   Drink plenty of water. Water helps thin the mucus so your sinuses can drain more easily.  Use a humidifier.  Inhale steam 3 to 4 times a day (for example, sit in the bathroom with the shower running).  Apply a warm, moist washcloth to your face 3 to 4  times a day, or as directed by your caregiver.  Use saline nasal sprays to help moisten and clean your sinuses.  Take over-the-counter or prescription medicines for pain, discomfort, or fever only as directed by your caregiver. SEEK IMMEDIATE MEDICAL CARE IF:  You have increasing pain or severe headaches.  You have  nausea, vomiting, or drowsiness.  You have swelling around your face.  You have vision problems.  You have a stiff neck.  You have difficulty breathing. MAKE SURE YOU:   Understand these instructions.  Will watch your condition.  Will get help right away if you are not doing well or get worse. Document Released: 08/19/2005 Document Revised: 11/11/2011 Document Reviewed: 09/03/2011 [facility] Patient Information 2014 [place], [place].

## 2013-09-07 NOTE — Progress Notes (Signed)
Chief Complaint  Patient presents with  . Nasal Congestion    Nasal discharge is green/yellow/and blood streaked.  Sx ongoing for 10 days.  . Cough  . Post Nasal Drip  . Otalgia    HPI: Patient comes in today for SDA for  new problem evaluation. PCP schedule NA  Sick since x mas  And thinks its a sinus infection  . But not getting better tried mucinex and now eyes are matting and ears right gurgles when bends over  Frontal. Pressure and pain nasal drainage NO fever poss low grade .  Hard to blow out. dicoudy mucous   Some cough no shortness of breath  Tends to get whitecoat hypertension blood sugars are well has a followup with PCP next week or 2  Doesn't have a blood pressure monitor at home lives alone  ROS: See pertinent positives and negatives per HPI. No chest pain shortness of breath.  History penicillin allergy with hives  thinks she has had Keflex before. Last sinus infection got a Z-Pak but had to have a second round of antibiotic. This was in [place].  Past Medical History  Diagnosis Date  . Hypertension     Family History  Problem Relation Age of Onset  . Alcohol abuse Father   . Hyperlipidemia      Grandparent   . Hypertension Mother   . Colon cancer Paternal Uncle   . Pancreatic cancer Paternal Uncle     History   Social History  . Marital Status: Divorced    Spouse Name: N/A    Number of Children: N/A  . Years of Education: N/A   Social History Main Topics  . Smoking status: Never Smoker   . Smokeless tobacco: None  . Alcohol Use: No  . Drug Use: None  . Sexual Activity: None   Other Topics Concern  . None   Social History Narrative  . None    Outpatient Encounter Prescriptions as of 09/07/2013  Medication Sig  . atorvastatin (LIPITOR) 20 MG tablet Take 1 tablet (20 mg total) by mouth daily.  Marland Kitchen losartan-hydrochlorothiazide (HYZAAR) 100-12.5 MG per tablet TAKE 1 TABLET BY MOUTH EVERY DAY  . cefUROXime (CEFTIN) 500 MG tablet Take 1 tablet  (500 mg total) by mouth 2 (two) times daily.  Manus Gunning BOWEL PREP SOLN Take 1 kit by mouth once.    EXAM:  BP 164/84  Pulse 103  Temp(Src) 98.1 F (36.7 C) (Oral)  Wt 272 lb (123.378 kg)  SpO2 98%  Body mass index is 45.26 kg/(m^2). WDWN in NAD  quiet respirations;  congested  . Non toxic . HEENT: Normocephalic ;atraumatic , Eyes;  PERRL, EOMs  Full, lids and conjunctiva clear,,Ears: no deformities, canals nl, TMclear fluid right  No bulging , Nose: no deformity congested mucoid dc congested;face  Bifrontal minimally tender Mouth : OP clear without lesion or edema . Neck: Supple without adenopathy or masses or bruits Chest:  Clear to A&P without wheezes rales or rhonchi CV:  S1-S2 no gallops or murmurs peripheral perfusion is normal Skin :nl perfusion and no acute rashes  mildly anxious  Repeat bp still elevated   ASSESSMENT AND PLAN:  Discussed the following assessment and plan:  Acute sinusitis with symptoms greater than 10 days - consider saline irrigation also   White coat hypertension - uncertain how much elevation is from [facility] effect get monitor and check bring in to visit with PCP  Serous otitis media, right  -Patient advised to return or  notify health care team  if symptoms worsen or persist or new concerns arise.  Patient Instructions  I think there is very little risk of allergic reaction  To the antibiotic based on your hx of PCN allergy. expect improvement in the next 3-5 days. Consider saline irrigations also.  To help in sinus infections.    Sinusitis Sinusitis is redness, soreness, and swelling (inflammation) of the paranasal sinuses. Paranasal sinuses are air pockets within the bones of your face (beneath the eyes, the middle of the forehead, or above the eyes). In healthy paranasal sinuses, mucus is able to drain out, and air is able to circulate through them by way of your nose. However, when your paranasal sinuses are inflamed, mucus and air can become  trapped. This can allow bacteria and other germs to grow and cause infection. Sinusitis can develop quickly and last only a short time (acute) or continue over a long period (chronic). Sinusitis that lasts for more than 12 weeks is considered chronic.  CAUSES  Causes of sinusitis include:  Allergies.  Structural abnormalities, such as displacement of the cartilage that separates your nostrils (deviated septum), which can decrease the air flow through your nose and sinuses and affect sinus drainage.  Functional abnormalities, such as when the small hairs (cilia) that line your sinuses and help remove mucus do not work properly or are not present. SYMPTOMS  Symptoms of acute and chronic sinusitis are the same. The primary symptoms are pain and pressure around the affected sinuses. Other symptoms include:  Upper toothache.  Earache.  Headache.  Bad breath.  Decreased sense of smell and taste.  A cough, which worsens when you are lying flat.  Fatigue.  Fever.  Thick drainage from your nose, which often is green and may contain pus (purulent).  Swelling and warmth over the affected sinuses. DIAGNOSIS  Your caregiver will perform a physical exam. During the exam, your caregiver may:  Look in your nose for signs of abnormal growths in your nostrils (nasal polyps).  Tap over the affected sinus to check for signs of infection.  View the inside of your sinuses (endoscopy) with a special imaging device with a light attached (endoscope), which is inserted into your sinuses. If your caregiver suspects that you have chronic sinusitis, one or more of the following tests may be recommended:  Allergy tests.  Nasal culture A sample of mucus is taken from your nose and sent to a lab and screened for bacteria.  Nasal cytology A sample of mucus is taken from your nose and examined by your caregiver to determine if your sinusitis is related to an allergy. TREATMENT  Most cases of acute  sinusitis are related to a viral infection and will resolve on their own within 10 days. Sometimes medicines are prescribed to help relieve symptoms (pain medicine, decongestants, nasal steroid sprays, or saline sprays).  However, for sinusitis related to a bacterial infection, your caregiver will prescribe antibiotic medicines. These are medicines that will help kill the bacteria causing the infection.  Rarely, sinusitis is caused by a fungal infection. In theses cases, your caregiver will prescribe antifungal medicine. For some cases of chronic sinusitis, surgery is needed. Generally, these are cases in which sinusitis recurs more than 3 times per year, despite other treatments. HOME CARE INSTRUCTIONS   Drink plenty of water. Water helps thin the mucus so your sinuses can drain more easily.  Use a humidifier.  Inhale steam 3 to 4 times a day (for example, sit  in the bathroom with the shower running).  Apply a warm, moist washcloth to your face 3 to 4 times a day, or as directed by your caregiver.  Use saline nasal sprays to help moisten and clean your sinuses.  Take over-the-counter or prescription medicines for pain, discomfort, or fever only as directed by your caregiver. SEEK IMMEDIATE MEDICAL CARE IF:  You have increasing pain or severe headaches.  You have nausea, vomiting, or drowsiness.  You have swelling around your face.  You have vision problems.  You have a stiff neck.  You have difficulty breathing. MAKE SURE YOU:   Understand these instructions.  Will watch your condition.  Will get help right away if you are not doing well or get worse. Document Released: 08/19/2005 Document Revised: 11/11/2011 Document Reviewed: 09/03/2011 [facility] Patient Information 2014 [place], [place].      Standley Brooking. Manette Doto M.D.  Pre visit review using our clinic review tool, if applicable. No additional management support is needed unless otherwise documented below in the visit  note. Total visit 70mns > 50% spent counseling and coordinating care

## 2013-09-16 ENCOUNTER — Ambulatory Visit: Payer: Medicare Other | Admitting: Family Medicine

## 2013-09-30 ENCOUNTER — Encounter: Payer: Medicare Other | Admitting: Internal Medicine

## 2013-10-01 ENCOUNTER — Telehealth: Payer: Self-pay | Admitting: Family Medicine

## 2013-10-01 NOTE — Telephone Encounter (Signed)
Relevant patient education mailed to patient.  

## 2013-10-06 ENCOUNTER — Ambulatory Visit: Payer: Medicare Other | Admitting: Family Medicine

## 2013-11-02 ENCOUNTER — Encounter: Payer: Self-pay | Admitting: Family Medicine

## 2013-11-02 ENCOUNTER — Ambulatory Visit (INDEPENDENT_AMBULATORY_CARE_PROVIDER_SITE_OTHER): Payer: Medicare Other | Admitting: Family Medicine

## 2013-11-02 VITALS — BP 142/90 | HR 106 | Wt 278.0 lb

## 2013-11-02 DIAGNOSIS — E119 Type 2 diabetes mellitus without complications: Secondary | ICD-10-CM

## 2013-11-02 DIAGNOSIS — E785 Hyperlipidemia, unspecified: Secondary | ICD-10-CM

## 2013-11-02 NOTE — Progress Notes (Signed)
   Subjective:    Patient ID: Joanne Mendoza, female    DOB: 02/04/1946, 68 y.o.   MRN: [id number]  HPI Far regarding multiple medical problems. She has history of morbid obesity, type 2 diabetes, dyslipidemia. We'll scheduled colonoscopy last visit but she never went for the actual test. She had some anxiety issues about being put to sleep. She had lost some weight over Christmas but has now gained most of that back. She is requesting another couple months before check followup labs. Recent A1c 7.1%. Recent LDL cholesterol 122. She remains on atorvastatin losartan HCTZ. No chest pains. Poor compliance with diet. Not monitoring blood pressures at home.  Past Medical History  Diagnosis Date  . Hypertension    Past Surgical History  Procedure Laterality Date  . Cesarean section      x 2   . Broken arm      reports that she has never smoked. She does not have any smokeless tobacco history on file. She reports that she does not drink alcohol. Her drug history is not on file. family history includes Alcohol abuse in her father; Colon cancer in her paternal uncle; Hyperlipidemia in an other family member; Hypertension in her mother; Pancreatic cancer in her paternal uncle. Allergies  Allergen Reactions  . Penicillins     REACTION: hives      Review of Systems  Constitutional: Positive for fatigue. Negative for unexpected weight change.  Eyes: Negative for visual disturbance.  Respiratory: Negative for cough, chest tightness, shortness of breath and wheezing.   Cardiovascular: Negative for chest pain, palpitations and leg swelling.  Endocrine: Negative for polydipsia and polyuria.  Neurological: Negative for dizziness, seizures, syncope, weakness, light-headedness and headaches.       Objective:   Physical Exam  Constitutional: She appears well-developed and well-nourished.  HENT:  Right Ear: External ear normal.  Left Ear: External ear normal.  Mouth/Throat: Oropharynx is clear  and moist.  Neck: Neck supple. No thyromegaly present.  Cardiovascular: Normal rate and regular rhythm.   Pulmonary/Chest: Effort normal and breath sounds normal. No respiratory distress. She has no wheezes. She has no rales.  Musculoskeletal: She exhibits no edema.          Assessment & Plan:  #1 type 2 diabetes. History of marginal control. Plan repeat A1c but she would like to give this 2 more months with better dietary compliance #2 hypertension. Marginal control. Work on weight loss. Reassess 2 months and add additional medication if not controlled better at that time #3 hyperlipidemia. Recent LDL 122. Repeat fasting labs in 2 months and increased atorvastatin if not better controlled then

## 2013-11-02 NOTE — Progress Notes (Signed)
Pre visit review using our clinic review tool, if applicable. No additional management support is needed unless otherwise documented below in the visit note. 

## 2013-11-04 ENCOUNTER — Telehealth: Payer: Self-pay

## 2013-11-04 NOTE — Telephone Encounter (Signed)
Relevant patient education mailed to patient.  

## 2013-12-29 ENCOUNTER — Other Ambulatory Visit: Payer: Medicare Other

## 2014-01-03 ENCOUNTER — Ambulatory Visit: Payer: Medicare Other | Admitting: Family Medicine

## 2014-01-10 ENCOUNTER — Other Ambulatory Visit: Payer: Medicare Other

## 2014-01-12 ENCOUNTER — Other Ambulatory Visit (INDEPENDENT_AMBULATORY_CARE_PROVIDER_SITE_OTHER): Payer: Medicare Other

## 2014-01-12 DIAGNOSIS — E785 Hyperlipidemia, unspecified: Secondary | ICD-10-CM

## 2014-01-12 DIAGNOSIS — E119 Type 2 diabetes mellitus without complications: Secondary | ICD-10-CM

## 2014-01-12 LAB — LIPID PANEL
CHOLESTEROL: 175 mg/dL (ref 0–200)
HDL: 41.3 mg/dL (ref >39.00)
LDL Cholesterol: 97 mg/dL (ref 0–99)
TRIGLYCERIDES: 184 mg/dL — AB (ref 0.0–149.0)
Total CHOL/HDL Ratio: 4
VLDL: 36.8 mg/dL (ref 0.0–40.0)

## 2014-01-12 LAB — HEMOGLOBIN A1C: Hgb A1c MFr Bld: 7.2 % — ABNORMAL HIGH (ref 4.6–6.5)

## 2014-01-17 ENCOUNTER — Ambulatory Visit (INDEPENDENT_AMBULATORY_CARE_PROVIDER_SITE_OTHER): Payer: Medicare Other | Admitting: Family Medicine

## 2014-01-17 ENCOUNTER — Encounter: Payer: Self-pay | Admitting: Family Medicine

## 2014-01-17 VITALS — BP 184/88 | HR 109 | Temp 97.9°F | Ht 65.0 in | Wt 280.0 lb

## 2014-01-17 DIAGNOSIS — E785 Hyperlipidemia, unspecified: Secondary | ICD-10-CM

## 2014-01-17 DIAGNOSIS — E119 Type 2 diabetes mellitus without complications: Secondary | ICD-10-CM

## 2014-01-17 DIAGNOSIS — I10 Essential (primary) hypertension: Secondary | ICD-10-CM

## 2014-01-17 MED ORDER — AMLODIPINE BESYLATE 5 MG PO TABS: 5.0000 mg | ORAL_TABLET | Freq: Every day | ORAL | Status: DC

## 2014-01-17 NOTE — Progress Notes (Signed)
Pre visit review using our clinic review tool, if applicable. No additional management support is needed unless otherwise documented below in the visit note. 

## 2014-01-17 NOTE — Progress Notes (Signed)
   Subjective:    Patient ID: Joanne Mendoza, female    DOB: [birth year], 68 y.o.   MRN: [id number]  HPI Patient here for followup regarding hypertension, type 2 diabetes, dyslipidemia. Recent A1c 7.2%. She start exercising more but has not lost any weight. No symptoms of hyperglycemia. She is reluctant to take diabetes medications at this time.  Recent lipids revealed good controlled LDL. Minimally elevated triglycerides. She remains on Lipitor. She has hypertension currently treated with losartan HCTZ. Does not monitor blood pressures regular home. No headaches. No chest pains. No dizziness. Compliant with therapy.  Past Medical History  Diagnosis Date  . Hypertension    Past Surgical History  Procedure Laterality Date  . Cesarean section      x 2   . Broken arm      reports that she has never smoked. She does not have any smokeless tobacco history on file. She reports that she does not drink alcohol. Her drug history is not on file. family history includes Alcohol abuse in her father; Colon cancer in her paternal uncle; Hyperlipidemia in an other family member; Hypertension in her mother; Pancreatic cancer in her paternal uncle. Allergies  Allergen Reactions  . Penicillins     REACTION: hives      Review of Systems  Constitutional: Negative for unexpected weight change.  Eyes: Negative for visual disturbance.  Respiratory: Negative for cough, chest tightness, shortness of breath and wheezing.   Cardiovascular: Negative for chest pain, palpitations and leg swelling.  Endocrine: Negative for polydipsia and polyuria.  Neurological: Negative for dizziness, seizures, syncope, weakness, light-headedness and headaches.       Objective:   Physical Exam  Constitutional: She appears well-developed and well-nourished.  Neck: Neck supple. No thyromegaly present.  Cardiovascular: Normal rate and regular rhythm.   Pulmonary/Chest: Effort normal and breath sounds normal. No respiratory  distress. She has no wheezes. She has no rales.  Musculoskeletal: She exhibits no edema.          Assessment & Plan:  #1 hypertension. Poorly controlled. Add Amlodipine 5 mg daily. Reassess blood pressure within 3-4 weeks. Continue weight loss efforts #2 dyslipidemia. Lipids improved and these were reviewed with patient. Continue atorvastatin #3 type 2 diabetes. Recent A1c 7.2%. We discussed possible addition of metformin. She prefers weight loss and reassess at 3 months

## 2014-02-17 ENCOUNTER — Encounter: Payer: Self-pay | Admitting: Family Medicine

## 2014-02-17 ENCOUNTER — Ambulatory Visit (INDEPENDENT_AMBULATORY_CARE_PROVIDER_SITE_OTHER): Payer: Medicare Other | Admitting: Family Medicine

## 2014-02-17 VITALS — BP 168/80 | Temp 98.1°F | Ht 65.0 in | Wt 277.0 lb

## 2014-02-17 DIAGNOSIS — I10 Essential (primary) hypertension: Secondary | ICD-10-CM

## 2014-02-17 NOTE — Progress Notes (Signed)
Pre visit review using our clinic review tool, if applicable. No additional management support is needed unless otherwise documented below in the visit note. 

## 2014-02-17 NOTE — Progress Notes (Signed)
   Subjective:    Patient ID: Joanne Mendoza, female    DOB: 1946/02/22, 68 y.o.   MRN: [id number]  HPI Patient seen for followup hypertension. We started amlodipine last visit. No edema or other side effects of possibly some mild fatigue. She's not monitoring blood pressure at home. She also takes losartan HCTZ. Strong family history of hypertension. She has lost 3 to4 pounds since last visit. She plans lose a lot more. She'll start exercising soon. No nonsteroidal use. No alcohol use.  Past Medical History  Diagnosis Date  . Hypertension    Past Surgical History  Procedure Laterality Date  . Cesarean section      x 2   . Broken arm      reports that she has never smoked. She does not have any smokeless tobacco history on file. She reports that she does not drink alcohol. Her drug history is not on file. family history includes Alcohol abuse in her father; Colon cancer in her paternal uncle; Hyperlipidemia in an other family member; Hypertension in her mother; Pancreatic cancer in her paternal uncle. Allergies  Allergen Reactions  . Penicillins     REACTION: hives      Review of Systems  Constitutional: Positive for fatigue.  Respiratory: Negative for shortness of breath.   Cardiovascular: Negative for chest pain and leg swelling.  Neurological: Negative for headaches.       Objective:   Physical Exam  Constitutional: She appears well-developed and well-nourished.  Cardiovascular: Normal rate and regular rhythm.   Pulmonary/Chest: Effort normal and breath sounds normal. No respiratory distress. She has no wheezes. She has no rales.  Musculoskeletal: She exhibits no edema.          Assessment & Plan:  Hypertension. Slightly improved but far from goal. We discussed options. She is reluctant to add further medication at this time.  Recommended further weight loss and also strongly encouraged to get home blood pressure monitor. We suspect whitecoat syndrome. Bring copy of  home blood pressures next visit and will add additional medication at that point if not further to goal

## 2014-02-17 NOTE — Patient Instructions (Signed)
Continue to lose weight and get blood pressure monitor and keep record

## 2014-03-31 ENCOUNTER — Ambulatory Visit: Payer: Medicare Other | Admitting: Family Medicine

## 2014-04-02 ENCOUNTER — Other Ambulatory Visit: Payer: Self-pay | Admitting: Family Medicine

## 2014-04-13 ENCOUNTER — Ambulatory Visit: Payer: Medicare Other | Admitting: Family Medicine

## 2014-04-14 ENCOUNTER — Other Ambulatory Visit: Payer: Self-pay | Admitting: Family Medicine

## 2014-04-21 ENCOUNTER — Encounter: Payer: Self-pay | Admitting: Family Medicine

## 2014-04-21 ENCOUNTER — Ambulatory Visit (INDEPENDENT_AMBULATORY_CARE_PROVIDER_SITE_OTHER): Payer: Medicare Other | Admitting: Family Medicine

## 2014-04-21 DIAGNOSIS — M549 Dorsalgia, unspecified: Secondary | ICD-10-CM

## 2014-04-21 DIAGNOSIS — I10 Essential (primary) hypertension: Secondary | ICD-10-CM

## 2014-04-21 NOTE — Patient Instructions (Signed)
Continue with weight loss Bring your BP cuff to next visit to compare with ours.

## 2014-04-21 NOTE — Progress Notes (Signed)
Pre visit review using our clinic review tool, if applicable. No additional management support is needed unless otherwise documented below in the visit note. 

## 2014-04-21 NOTE — Progress Notes (Signed)
   Subjective:    Patient ID: Joanne Mendoza, female    DOB: 02/15/1946, 68 y.o.   MRN: [id number]  HPI Patient here for followup hypertension. Refer to prior note. She's done an excellent job with weight loss and has lost about 7 pounds since then. She remains on losartan HCTZ and amlodipine. We elected not to add additional medication yet. She obtained new blood pressure monitor and has been getting around 140-155 systolic and 70s diastolic. No headaches. No chest pain. Overall feels well  Past Medical History  Diagnosis Date  . Hypertension    Past Surgical History  Procedure Laterality Date  . Cesarean section      x 2   . Broken arm      reports that she has never smoked. She does not have any smokeless tobacco history on file. She reports that she does not drink alcohol. Her drug history is not on file. family history includes Alcohol abuse in her father; Colon cancer in her paternal uncle; Hyperlipidemia in an other family member; Hypertension in her mother; Pancreatic cancer in her paternal uncle. Allergies  Allergen Reactions  . Penicillins     REACTION: hives      Review of Systems  Constitutional: Negative for fatigue.  Eyes: Negative for visual disturbance.  Respiratory: Negative for cough, chest tightness, shortness of breath and wheezing.   Cardiovascular: Negative for chest pain, palpitations and leg swelling.  Neurological: Negative for dizziness, seizures, syncope, weakness, light-headedness and headaches.       Objective:   Physical Exam  Constitutional: She appears well-developed and well-nourished.  Neck: Neck supple. No thyromegaly present.  Cardiovascular: Normal rate and regular rhythm.   Pulmonary/Chest: Effort normal and breath sounds normal. No respiratory distress. She has no wheezes. She has no rales.  Musculoskeletal: She exhibits no edema.          Assessment & Plan:  Hypertension. Improving by home readings. She has likely element of  white coat syndrome. Continue weight loss efforts. Reassess in 2 months. She is encouraged to bring her cuff then.

## 2014-05-04 ENCOUNTER — Ambulatory Visit (INDEPENDENT_AMBULATORY_CARE_PROVIDER_SITE_OTHER): Payer: Medicare Other | Admitting: Family Medicine

## 2014-05-04 ENCOUNTER — Encounter: Payer: Self-pay | Admitting: Family Medicine

## 2014-05-04 VITALS — BP 140/80 | HR 105 | Temp 97.9°F | Wt 270.0 lb

## 2014-05-04 DIAGNOSIS — J019 Acute sinusitis, unspecified: Secondary | ICD-10-CM

## 2014-05-04 DIAGNOSIS — H1030 Unspecified acute conjunctivitis, unspecified eye: Secondary | ICD-10-CM

## 2014-05-04 DIAGNOSIS — H1032 Unspecified acute conjunctivitis, left eye: Secondary | ICD-10-CM

## 2014-05-04 MED ORDER — POLYMYXIN B-TRIMETHOPRIM 10000-0.1 UNIT/ML-% OP SOLN: 2.0000 [drp] | OPHTHALMIC | Status: DC

## 2014-05-04 MED ORDER — CEFUROXIME AXETIL 500 MG PO TABS: 500.0000 mg | ORAL_TABLET | Freq: Two times a day (BID) | ORAL | Status: DC

## 2014-05-04 NOTE — Progress Notes (Signed)
   Subjective:    Patient ID: Joanne Mendoza, female    DOB: 1946-06-01, 68 y.o.   MRN: [id number]  Sinusitis Associated symptoms include chills and congestion. Pertinent negatives include no coughing or sore throat.   Patient seen with acute illness. She's had about one week history of left maxillary facial pressure and some yellowish discharge. She's had some intermittent mild headaches. No fevers but has had occasional chills. Denies any cough. No nausea or vomiting. She developed some left eye redness and matting over the past day. No blurred vision. No eye pain. Using warm compresses. Similar sinusitis symptoms last winter treated with Ceftin and she did well with that antibiotic.  Past Medical History  Diagnosis Date  . Hypertension    Past Surgical History  Procedure Laterality Date  . Cesarean section      x 2   . Broken arm      reports that she has never smoked. She does not have any smokeless tobacco history on file. She reports that she does not drink alcohol. Her drug history is not on file. family history includes Alcohol abuse in her father; Colon cancer in her paternal uncle; Hyperlipidemia in an other family member; Hypertension in her mother; Pancreatic cancer in her paternal uncle. Allergies  Allergen Reactions  . Penicillins     REACTION: hives      Review of Systems  Constitutional: Positive for chills. Negative for fever.  HENT: Positive for congestion. Negative for sore throat.   Respiratory: Negative for cough.        Objective:   Physical Exam  Constitutional: She appears well-developed and well-nourished.  HENT:  Right Ear: External ear normal.  Left Ear: External ear normal.  Mouth/Throat: Oropharyngeal exudate present.  Eyes:  Left eye is erythematous. She has some yellow crusted drainage. Conjunctiva erythematous.  Neck: Neck supple.  Cardiovascular: Normal rate and regular rhythm.   Pulmonary/Chest: Effort normal and breath sounds normal. No  respiratory distress. She has no wheezes. She has no rales.  Lymphadenopathy:    She has no cervical adenopathy.          Assessment & Plan:  #1 acute bacterial conjunctivitis left eye. Warm compresses several times daily. Polytrim ophthalmic drops every 4 hours while awake #2 acute left maxillary sinusitis. Ceftin 500 mg twice a day for 10 days

## 2014-05-04 NOTE — Patient Instructions (Signed)

## 2014-05-04 NOTE — Progress Notes (Signed)
Pre visit review using our clinic review tool, if applicable. No additional management support is needed unless otherwise documented below in the visit note. 

## 2014-06-01 ENCOUNTER — Telehealth: Payer: Self-pay

## 2014-06-01 NOTE — Telephone Encounter (Signed)
Called patient//LMOVM stating time to schedule annual medicare physical with PCP.

## 2014-06-03 ENCOUNTER — Telehealth: Payer: Self-pay | Admitting: Family Medicine

## 2014-06-03 NOTE — Telephone Encounter (Signed)
Pt would like to get matter solve ASAP. Pt stated she would like to talk  To md personally.

## 2014-06-03 NOTE — Telephone Encounter (Signed)
Depo was put in chart by a mistake. Chart is corrected. Pt is stating that she has never been treated for back pain before.

## 2014-06-03 NOTE — Telephone Encounter (Signed)
Pt is concern about charge on her bill that she receive inj on 04-21-14. Per encounter pt had depo-medrol. Pt would like nurse to call her back before 4pm. Pt was giving billing number and was told to call our office.

## 2014-06-03 NOTE — Telephone Encounter (Signed)
Spoke with patient informed patient the injection was taking out, but i could not take the Dx out. She stated that Dr. Leonard SchwartzB has never seen her for back pain before and she does not want that in her chart. Pt was informed that it was voided. Pt would still like a call back from Dr. Leonard SchwartzB.

## 2014-06-03 NOTE — Telephone Encounter (Signed)
Had long discussion with patient. She is fully aware that Depakote injection was placed into her chart by mistake by nursing staff. We explained that diagnosis of back pain will be removed and that any charges related to injection that were not given will be corrected with insurance. Patient understands

## 2014-06-08 ENCOUNTER — Telehealth: Payer: Self-pay

## 2014-06-08 NOTE — Telephone Encounter (Addendum)
Left a message for call back.  Called patient regarding annual eye exam.  When patient calls back please ask:  Have you had a recent (2014-2015) eye exam?   Date of Exam? Where?    

## 2014-06-17 NOTE — Telephone Encounter (Signed)
Methylprednisolone charge voided as requested.  Also, chart amendment request form was mailed to pt to complete and return to medical records.

## 2014-06-20 ENCOUNTER — Ambulatory Visit: Payer: Medicare Other | Admitting: Family Medicine

## 2014-06-29 NOTE — Telephone Encounter (Signed)
Pt has not had an eye exam within the last 2 years.  She has an appointment on 07/14/14 @ 1:15 pm with Dr. Caryl NeverBurchette, she will discuss ophthalmologist suggestions with him then.  Otherwise, she will need a referral.

## 2014-07-14 ENCOUNTER — Ambulatory Visit (INDEPENDENT_AMBULATORY_CARE_PROVIDER_SITE_OTHER): Payer: Medicare Other | Admitting: Family Medicine

## 2014-07-14 ENCOUNTER — Ambulatory Visit (INDEPENDENT_AMBULATORY_CARE_PROVIDER_SITE_OTHER): Payer: Medicare Other

## 2014-07-14 ENCOUNTER — Encounter: Payer: Self-pay | Admitting: Family Medicine

## 2014-07-14 VITALS — BP 140/74 | HR 99 | Temp 97.9°F | Wt 264.0 lb

## 2014-07-14 DIAGNOSIS — Z23 Encounter for immunization: Secondary | ICD-10-CM

## 2014-07-14 DIAGNOSIS — I10 Essential (primary) hypertension: Secondary | ICD-10-CM

## 2014-07-14 NOTE — Progress Notes (Signed)
   Subjective:    Patient ID: Joanne Mendoza, female    DOB: 1946/05/28, 68 y.o.   MRN: [id number]  HPI Follow-up hypertension. Patient has history of probable white coat syndrome. By home blood pressure monitoring she is getting mostly 130s systolic. She remains on 2 drug regimen. Compliant with therapy. She has lost 13 pounds since this summer and is exercising more regularly   Past Medical History  Diagnosis Date  . Hypertension    Past Surgical History  Procedure Laterality Date  . Cesarean section      x 2   . Broken arm      reports that she has never smoked. She does not have any smokeless tobacco history on file. She reports that she does not drink alcohol. Her drug history is not on file. family history includes Alcohol abuse in her father; Colon cancer in her paternal uncle; Hyperlipidemia in an other family member; Hypertension in her mother; Pancreatic cancer in her paternal uncle. Allergies  Allergen Reactions  . Penicillins     REACTION: hives      Review of Systems  Constitutional: Negative for fatigue.  Eyes: Negative for visual disturbance.  Respiratory: Negative for cough, chest tightness, shortness of breath and wheezing.   Cardiovascular: Negative for chest pain, palpitations and leg swelling.  Neurological: Negative for dizziness, seizures, syncope, weakness, light-headedness and headaches.       Objective:   Physical Exam  Constitutional: She appears well-developed and well-nourished. No distress.  Neck: Neck supple. No thyromegaly present.  Cardiovascular: Normal rate and regular rhythm.   Pulmonary/Chest: Effort normal and breath sounds normal. No respiratory distress. She has no wheezes. She has no rales.  Musculoskeletal: She exhibits no edema.          Assessment & Plan:  Hypertension. Probable white coat syndrome. Improved by her readings. We expect this to continue with weight loss and aerobic exercise. Reassess 6 months

## 2014-07-14 NOTE — Patient Instructions (Signed)

## 2014-07-14 NOTE — Progress Notes (Signed)
Pre visit review using our clinic review tool, if applicable. No additional management support is needed unless otherwise documented below in the visit note. 

## 2014-08-09 ENCOUNTER — Other Ambulatory Visit: Payer: Self-pay | Admitting: Family Medicine

## 2014-09-19 NOTE — Progress Notes (Signed)
Back Pain, unspecified location - Primary ICD9 724.5/ICD10 M54.9, order for Methylprednisolone 80mg  IM injection and administration of same medication removed from chart per amendment request approved by Dr. Evelena PeatBruce Burchette on 06/20/2014.

## 2014-09-28 ENCOUNTER — Telehealth: Payer: Self-pay | Admitting: Family Medicine

## 2014-09-28 NOTE — Telephone Encounter (Signed)
Request for Amendment of Health Information to the patient's chart received 06/21/14. Evelena PeatBruce Burchette MD approved the amendment on 06/30/14.

## 2014-12-30 ENCOUNTER — Telehealth: Payer: Self-pay

## 2014-12-30 NOTE — Telephone Encounter (Signed)
Pt called to c/o sinus infection. She states that she has had a low grade (did not check it), ear pain, productive cough with green/yellow mucus, congestion x 6 days. She states that this happens to her at least once a year. [place] the office. Pt states she call have abx transferred to whatever [facility] is near when she lands. She notes that Dr. Caryl NeverBurchette did this for her about 10 years ago. I advised pt that MD is out of the office and she asked if I can contact him. I told her that we do not contact providers about pts once they leave for the day. She then asked if MD will be in the office on Monday. I told her that he will. Advised pt that she will need to go to an UC and suggested that she may ask a pharmacist what OTC medication may help with her sxs. Pt verbalized understanding

## 2015-01-06 ENCOUNTER — Other Ambulatory Visit: Payer: Self-pay | Admitting: Family Medicine

## 2015-01-11 ENCOUNTER — Other Ambulatory Visit: Payer: Medicare Other

## 2015-01-18 ENCOUNTER — Encounter: Payer: Medicare Other | Admitting: Family Medicine

## 2015-02-07 ENCOUNTER — Telehealth: Payer: Self-pay

## 2015-02-07 NOTE — Telephone Encounter (Signed)
Spoke with patient, has mamm done at gyn. office

## 2015-03-30 ENCOUNTER — Other Ambulatory Visit: Payer: Self-pay | Admitting: Family Medicine

## 2015-04-11 ENCOUNTER — Other Ambulatory Visit: Payer: Self-pay | Admitting: Family Medicine

## 2015-05-29 ENCOUNTER — Telehealth: Payer: Self-pay | Admitting: Family Medicine

## 2015-05-29 NOTE — Telephone Encounter (Signed)
I am hesitant to over-book our schedule with a new nurse. Would offer with someone else or work in tomorrow with [place].

## 2015-05-29 NOTE — Telephone Encounter (Signed)
Pt thinks she has another sinus infection coupled with wheezing. Would really like to see you this afternoon if possible. Your schedule is full. I will call her back and work her in with another MD if you are not able to see her today.

## 2015-05-30 ENCOUNTER — Ambulatory Visit (INDEPENDENT_AMBULATORY_CARE_PROVIDER_SITE_OTHER): Payer: Medicare Other | Admitting: Family Medicine

## 2015-05-30 VITALS — BP 150/74 | HR 117 | Temp 97.5°F | Ht 65.0 in | Wt 272.2 lb

## 2015-05-30 DIAGNOSIS — R062 Wheezing: Secondary | ICD-10-CM | POA: Diagnosis not present

## 2015-05-30 DIAGNOSIS — R05 Cough: Secondary | ICD-10-CM | POA: Diagnosis not present

## 2015-05-30 DIAGNOSIS — E785 Hyperlipidemia, unspecified: Secondary | ICD-10-CM

## 2015-05-30 DIAGNOSIS — R059 Cough, unspecified: Secondary | ICD-10-CM

## 2015-05-30 DIAGNOSIS — E119 Type 2 diabetes mellitus without complications: Secondary | ICD-10-CM

## 2015-05-30 DIAGNOSIS — I10 Essential (primary) hypertension: Secondary | ICD-10-CM

## 2015-05-30 MED ORDER — AZITHROMYCIN 250 MG PO TABS: ORAL_TABLET | ORAL | Status: AC

## 2015-05-30 MED ORDER — PREDNISONE 20 MG PO TABS: ORAL_TABLET | ORAL | Status: DC

## 2015-05-30 NOTE — Patient Instructions (Signed)
Please schedule labs to follow up on your diabetes and high cholesterol and let's plan on follow up in about 3 weeks.

## 2015-05-30 NOTE — Progress Notes (Signed)
   Subjective:    Patient ID: Joanne Mendoza, female    DOB: 1946/03/25, 69 y.o.   MRN: [id number]  HPI Patient seen for acute visit. She developed about 10 days ago some sinus congestive symptoms and subsequent cough productive of yellow sputum and sometimes dry. Possible low-grade fever initially. She has had some wheezing off and on. Nonsmoker. No history of asthma. Increased malaise.  History of type 2 diabetes, hypertension, hyperlipidemia. Poor history with follow-up and compliance. Not exercising other than occasional swimming. Not monitoring blood sugars. She is overdue for labs. Denies any recent chest pains. Blood pressure borderline control. No symptoms of polyuria or polydipsia. Medications reviewed. Compliant with all. No side effects.  Past Medical History  Diagnosis Date  . Hypertension    Past Surgical History  Procedure Laterality Date  . Cesarean section      x 2   . Broken arm      reports that she has never smoked. She does not have any smokeless tobacco history on file. She reports that she does not drink alcohol. Her drug history is not on file. family history includes Alcohol abuse in her father; Colon cancer in her paternal uncle; Hyperlipidemia in an other family member; Hypertension in her mother; Pancreatic cancer in her paternal uncle. Allergies  Allergen Reactions  . Penicillins     REACTION: hives      Review of Systems  Constitutional: Positive for fatigue.  HENT: Positive for congestion and sinus pressure.   Eyes: Negative for visual disturbance.  Respiratory: Positive for cough. Negative for chest tightness, shortness of breath and wheezing.   Cardiovascular: Negative for chest pain, palpitations and leg swelling.  Endocrine: Negative for polydipsia and polyuria.  Neurological: Negative for dizziness, seizures, syncope, weakness, light-headedness and headaches.  Psychiatric/Behavioral: Negative for dysphoric mood. The patient is nervous/anxious.          Objective:   Physical Exam  Constitutional: She appears well-developed and well-nourished.  HENT:  Right Ear: External ear normal.  Left Ear: External ear normal.  Mouth/Throat: Oropharynx is clear and moist.  Neck: Neck supple. No thyromegaly present.  Cardiovascular: Normal rate and regular rhythm.  Exam reveals no gallop.   Pulmonary/Chest: Effort normal. She has wheezes. She has no rales.  Musculoskeletal: She exhibits no edema.  Lymphadenopathy:    She has no cervical adenopathy.          Assessment & Plan:  #1 cough with associated wheezing. Probable acute bronchitis. She has tendencies towards acute sinusitis as well. Given duration of symptoms around 10 days and worsening we'll start Zithromax and prednisone 40 mg daily for 5 days. #2 type 2 diabetes. Currently not treated with medication. Not checked in quite some time. Ordered future lab for A1c to get within the next 3 weeks #3 hypertension slightly elevated today though patient is ill as above. Reassess in 3 weeks and consider further medication at that point if not better controlled #4 hyperlipidemia. Ordered future lab for lipid panel and hepatic panel.

## 2015-05-30 NOTE — Progress Notes (Signed)
Pre visit review using our clinic review tool, if applicable. No additional management support is needed unless otherwise documented below in the visit note. 

## 2015-06-05 ENCOUNTER — Emergency Department (HOSPITAL_COMMUNITY)
Admission: EM | Admit: 2015-06-05 | Discharge: 2015-06-05 | Disposition: A | Discharge status: Home / Self Care | Payer: Medicare Other | Attending: Emergency Medicine | Admitting: Emergency Medicine

## 2015-06-05 ENCOUNTER — Encounter (HOSPITAL_COMMUNITY): Payer: Self-pay

## 2015-06-05 ENCOUNTER — Emergency Department (HOSPITAL_COMMUNITY): Payer: Medicare Other

## 2015-06-05 DIAGNOSIS — F419 Anxiety disorder, unspecified: Secondary | ICD-10-CM | POA: Insufficient documentation

## 2015-06-05 DIAGNOSIS — E785 Hyperlipidemia, unspecified: Secondary | ICD-10-CM | POA: Diagnosis not present

## 2015-06-05 DIAGNOSIS — Z880 Allergy status to penicillin: Secondary | ICD-10-CM | POA: Diagnosis not present

## 2015-06-05 DIAGNOSIS — I10 Essential (primary) hypertension: Secondary | ICD-10-CM | POA: Insufficient documentation

## 2015-06-05 DIAGNOSIS — Z79899 Other long term (current) drug therapy: Secondary | ICD-10-CM | POA: Insufficient documentation

## 2015-06-05 DIAGNOSIS — R002 Palpitations: Secondary | ICD-10-CM | POA: Insufficient documentation

## 2015-06-05 DIAGNOSIS — R000 Tachycardia, unspecified: Secondary | ICD-10-CM | POA: Diagnosis present

## 2015-06-05 HISTORY — DX: Hyperlipidemia, unspecified: E78.5

## 2015-06-05 LAB — CBC
HCT: 42.1 % (ref 36.0–46.0)
HEMOGLOBIN: 14.4 g/dL (ref 12.0–15.0)
MCH: 31.2 pg (ref 26.0–34.0)
MCHC: 34.2 g/dL (ref 30.0–36.0)
MCV: 91.1 fL (ref 78.0–100.0)
Platelets: 282 10*3/uL (ref 150–400)
RBC: 4.62 MIL/uL (ref 3.87–5.11)
RDW: 13.4 % (ref 11.5–15.5)
WBC: 19.6 10*3/uL — AB (ref 4.0–10.5)

## 2015-06-05 LAB — I-STAT TROPONIN, ED: TROPONIN I, POC: 0 ng/mL (ref 0.00–0.08)

## 2015-06-05 LAB — BASIC METABOLIC PANEL
ANION GAP: 11 (ref 5–15)
BUN: 15 mg/dL (ref 6–20)
CALCIUM: 9.3 mg/dL (ref 8.9–10.3)
CO2: 24 mmol/L (ref 22–32)
CREATININE: 0.74 mg/dL (ref 0.44–1.00)
Chloride: 100 mmol/L — ABNORMAL LOW (ref 101–111)
Glucose, Bld: 234 mg/dL — ABNORMAL HIGH (ref 65–99)
Potassium: 3.9 mmol/L (ref 3.5–5.1)
SODIUM: 135 mmol/L (ref 135–145)

## 2015-06-05 NOTE — Discharge Instructions (Signed)
Please read and follow all provided instructions.  Your diagnoses today include:  1. Palpitations    Tests performed today include:  Blood counts and electrolytes - normal  Troponin - test for heart muscle damage was normal  Vital signs. See below for your results today.   Medications prescribed:   None  Take any prescribed medications only as directed.  Home care instructions:  Follow any educational materials contained in this packet.  BE VERY CAREFUL not to take multiple medicines containing Tylenol (also called acetaminophen). Doing so can lead to an overdose which can damage your liver and cause liver failure and possibly death.   Follow-up instructions: Please follow-up with your primary care provider in the next 3 days for further evaluation of your symptoms.   Return instructions:   Please return to the Emergency Department if you experience worsening symptoms.   Please return if you have any other emergent concerns.  Additional Information:  Your vital signs today were: BP 150/64 mmHg   Pulse 88   Temp(Src) 97.8 F (36.6 C) (Oral)   Resp 22   SpO2 94% If your blood pressure (BP) was elevated above 135/85 this visit, please have this repeated by your doctor within one month. --------------

## 2015-06-05 NOTE — ED Notes (Signed)
Pt. Presents with complaint of feeling her "heart racing" x 5 hours this AM. Pt. States she had no improvement with rest. EMS noted pt. To have some PVCs. Pt. Recently txt for bronchitis with prednisone and zpac (last dose Saturday). Pt. Denies CP/SOB. Pt. Denies N/V/D. Denies dizziness/lightheadness.

## 2015-06-05 NOTE — ED Provider Notes (Signed)
CSN: [id number]     Arrival date & time 06/05/15  1207 History   First MD Initiated Contact with Patient 06/05/15 1212     Chief Complaint  Patient presents with  . Tachycardia     (Consider location/radiation/quality/duration/timing/severity/associated sxs/prior Treatment) HPI Comments: Patient with history of hypertension, hyperlipidemia presents with complaint of palpitations. Patient states that after she awoke this morning she started having a sensation of racing heart. She states that she took her pulse rate and it was fast. She did not have any associated shortness of breath, chest pain, lightheadedness. No fever or cough. She denies any other medical symptoms. No nausea, vomiting, or diarrhea. Patient was recently seen by her primary care physician and was prescribed prednisone and azithromycin for bronchitis which she took for 5 days. Her last dose was 2 days ago. The onset of this condition was acute. The course is improved. Aggravating factors: none. Alleviating factors: none.    The history is provided by the patient and medical records.    Past Medical History  Diagnosis Date  . Hypertension   . Hyperlipidemia    Past Surgical History  Procedure Laterality Date  . Cesarean section      x 2   . Broken arm     Family History  Problem Relation Age of Onset  . Alcohol abuse Father   . Hyperlipidemia      Grandparent   . Hypertension Mother   . Colon cancer Paternal Uncle   . Pancreatic cancer Paternal Uncle    Social History  Substance Use Topics  . Smoking status: Never Smoker   . Smokeless tobacco: None  . Alcohol Use: No   OB History    No data available     Review of Systems  Constitutional: Negative for fever and diaphoresis.  Eyes: Negative for redness.  Respiratory: Negative for cough and shortness of breath.   Cardiovascular: Positive for palpitations. Negative for chest pain and leg swelling.  Gastrointestinal: Negative for nausea, vomiting and  abdominal pain.  Genitourinary: Negative for dysuria.  Musculoskeletal: Negative for back pain and neck pain.  Skin: Negative for rash.  Neurological: Negative for syncope and light-headedness.  Psychiatric/Behavioral: The patient is not nervous/anxious.       Allergies  Penicillins  Home Medications   Prior to Admission medications   Medication Sig Start Date End Date Taking? Authorizing Provider  amLODipine (NORVASC) 5 MG tablet TAKE 1 TABLET BY MOUTH EVERY DAY 08/09/14   Kristian Covey, MD  atorvastatin (LIPITOR) 20 MG tablet TAKE 1 TABLET BY MOUTH EVERY DAY 03/30/15   Kristian Covey, MD  losartan-hydrochlorothiazide (HYZAAR) 100-12.5 MG per tablet TAKE 1 TABLET BY MOUTH EVERY DAY 04/11/15   Kristian Covey, MD  predniSONE (DELTASONE) 20 MG tablet Take 2 tablets once daily for 5 days 05/30/15   Kristian Covey, MD   SpO2 97% Physical Exam  Constitutional: She appears well-developed and well-nourished.  HENT:  Head: Normocephalic and atraumatic.  Mouth/Throat: Mucous membranes are normal. Mucous membranes are not dry.  Eyes: Conjunctivae are normal.  Neck: Trachea normal and normal range of motion. Neck supple. Normal carotid pulses and no JVD present. No muscular tenderness present. Carotid bruit is not present. No tracheal deviation present.  Cardiovascular: Normal rate, regular rhythm, S1 normal, S2 normal, normal heart sounds and intact distal pulses.  Exam reveals no decreased pulses.   No murmur heard. Pulmonary/Chest: Effort normal. No respiratory distress. She has no wheezes. She exhibits no  tenderness.  Abdominal: Soft. Normal aorta and bowel sounds are normal. There is no tenderness. There is no rebound and no guarding.  Musculoskeletal: Normal range of motion.  Neurological: She is alert.  Skin: Skin is warm and dry. She is not diaphoretic. No cyanosis. No pallor.  Psychiatric: Her mood appears anxious.  Nursing note and vitals reviewed.   ED Course   Procedures (including critical care time) Labs Review Labs Reviewed  CBC - Abnormal; Notable for the following:    WBC 19.6 (*)    All other components within normal limits  BASIC METABOLIC PANEL - Abnormal; Notable for the following:    Chloride 100 (*)    Glucose, Bld 234 (*)    All other components within normal limits  I-STAT TROPOININ, ED    Imaging Review No results found. I have personally reviewed and evaluated these images and lab results as part of my medical decision-making.   EKG Interpretation   Date/Time:  Monday June 05 2015 12:13:50 EDT Ventricular Rate:  101 PR Interval:  190 QRS Duration: 79 QT Interval:  324 QTC Calculation: 420 R Axis:   -33 Text Interpretation:  Sinus tachycardia Abnormal R-wave progression, early  transition Probable left ventricular hypertrophy Sinus tachycardia  Artifact Left ventricular hypertrophy Left axis deviation Abnormal ekg  Confirmed by Gerhard Munch  MD [phone]) on 06/05/2015 12:33:56 PM       12:51 PM Patient seen and examined. Work-up initiated.    Vital signs reviewed and are as follows: BP 159/75 mmHg  Pulse 91  Temp(Src) 97.8 F (36.6 C) (Oral)  Resp 20  SpO2 94%  3:06 PM Patient discussed and seen by Dr. Jeraldine Loots.   Work-up unrevealing. Patient is feeling better and seems reassured. Recommend PCP f/u. Return to the emergency department with palpitations if they're associated with lightheadedness, shortness of breath, chest pain. Patient verbalizes understanding and agrees with plan.   MDM   Final diagnoses:  Palpitations   Patient with palpitations. Negative chest x-ray, EKG, troponin. Electrolytes are normal. Patient does have an elevated white blood cell count however this is likely related to the course of steroids which she recently finished. Patient's palpitations and other symptoms may also be related to recent steroid use. Patient was recently on azithromycin however no prolonged QT on EKG.  Patient will monitor for resolution over the next several days. Time of discharge she is feeling better. Return instructions and follow-up as above. Patient appears well, nontoxic. Very low suspicion of ACS.    Renne Crigler, PA-C 06/05/15 1512  Gerhard Munch, MD 06/06/15 [phone]

## 2015-06-20 ENCOUNTER — Other Ambulatory Visit (INDEPENDENT_AMBULATORY_CARE_PROVIDER_SITE_OTHER): Payer: Medicare Other

## 2015-06-20 DIAGNOSIS — I10 Essential (primary) hypertension: Secondary | ICD-10-CM | POA: Diagnosis not present

## 2015-06-20 DIAGNOSIS — E785 Hyperlipidemia, unspecified: Secondary | ICD-10-CM | POA: Diagnosis not present

## 2015-06-20 DIAGNOSIS — E119 Type 2 diabetes mellitus without complications: Secondary | ICD-10-CM

## 2015-06-20 LAB — HEPATIC FUNCTION PANEL
ALBUMIN: 3.9 g/dL (ref 3.5–5.2)
ALK PHOS: 75 U/L (ref 39–117)
ALT: 19 U/L (ref 0–35)
AST: 14 U/L (ref 0–37)
Bilirubin, Direct: 0.2 mg/dL (ref 0.0–0.3)
TOTAL PROTEIN: 7 g/dL (ref 6.0–8.3)
Total Bilirubin: 1.5 mg/dL — ABNORMAL HIGH (ref 0.2–1.2)

## 2015-06-20 LAB — LIPID PANEL
CHOL/HDL RATIO: 4
Cholesterol: 188 mg/dL (ref 0–200)
HDL: 44.1 mg/dL (ref >39.00)
LDL Cholesterol: 106 mg/dL — ABNORMAL HIGH (ref 0–99)
NonHDL: 143.91
TRIGLYCERIDES: 192 mg/dL — AB (ref 0.0–149.0)
VLDL: 38.4 mg/dL (ref 0.0–40.0)

## 2015-06-20 LAB — BASIC METABOLIC PANEL
BUN: 12 mg/dL (ref 6–23)
CALCIUM: 9.6 mg/dL (ref 8.4–10.5)
CO2: 29 meq/L (ref 19–32)
Chloride: 103 mEq/L (ref 96–112)
Creatinine, Ser: 0.72 mg/dL (ref 0.40–1.20)
GFR: 85.68 mL/min (ref >60.00)
Glucose, Bld: 179 mg/dL — ABNORMAL HIGH (ref 70–99)
Potassium: 3.9 mEq/L (ref 3.5–5.1)
SODIUM: 139 meq/L (ref 135–145)

## 2015-06-20 LAB — HEMOGLOBIN A1C: Hgb A1c MFr Bld: 7.5 % — ABNORMAL HIGH (ref 4.6–6.5)

## 2015-06-27 ENCOUNTER — Ambulatory Visit (INDEPENDENT_AMBULATORY_CARE_PROVIDER_SITE_OTHER): Payer: Medicare Other | Admitting: Family Medicine

## 2015-06-27 VITALS — BP 180/100 | HR 102 | Temp 98.1°F | Resp 16 | Ht 65.0 in | Wt 271.5 lb

## 2015-06-27 DIAGNOSIS — Z23 Encounter for immunization: Secondary | ICD-10-CM | POA: Diagnosis not present

## 2015-06-27 DIAGNOSIS — E785 Hyperlipidemia, unspecified: Secondary | ICD-10-CM | POA: Diagnosis not present

## 2015-06-27 DIAGNOSIS — E1165 Type 2 diabetes mellitus with hyperglycemia: Secondary | ICD-10-CM | POA: Diagnosis not present

## 2015-06-27 DIAGNOSIS — I10 Essential (primary) hypertension: Secondary | ICD-10-CM

## 2015-06-27 DIAGNOSIS — IMO0001 Reserved for inherently not codable concepts without codable children: Secondary | ICD-10-CM

## 2015-06-27 MED ORDER — METOPROLOL SUCCINATE ER 50 MG PO TB24: 50.0000 mg | ORAL_TABLET | Freq: Every day | ORAL | Status: DC

## 2015-06-27 NOTE — Patient Instructions (Signed)
Lose some weight Monitor blood pressure and be in touch if consistently > 160/90

## 2015-06-27 NOTE — Progress Notes (Signed)
Pre visit review using our clinic review tool, if applicable. No additional management support is needed unless otherwise documented below in the visit note. 

## 2015-06-27 NOTE — Progress Notes (Signed)
   Subjective:    Patient ID: Joanne Mendoza, female    DOB: 28-Jun-1946, 69 y.o.   MRN: [id number]  HPI Recent upper respiratory infection improved. She was placed on prednisone and developed some palpitations and went to the ER. No significant arrhythmia. Symptoms have fully resolved since then.  Type 2 diabetes. Currently not treated. Recent A1c 7.5%. No polyuria or polydipsia. Poor compliance with diet. No regular exercise. No neuropathy symptoms  Hyperlipidemia. Recent labs reviewed. LDL 106. She remains on atorvastatin and compliant with therapy  Hypertension. History of whitecoat syndrome. Not monitoring recently at home. Recent elevated reading here as well. She remains on losartan HCTZ and amlodipine. Compliant with therapy.  Past Medical History  Diagnosis Date  . Hypertension   . Hyperlipidemia    Past Surgical History  Procedure Laterality Date  . Cesarean section      x 2   . Broken arm      reports that she has never smoked. She does not have any smokeless tobacco history on file. She reports that she does not drink alcohol or use illicit drugs. family history includes Alcohol abuse in her father; Colon cancer in her paternal uncle; Hyperlipidemia in an other family member; Hypertension in her mother; Pancreatic cancer in her paternal uncle. Allergies  Allergen Reactions  . Penicillins     REACTION: hives      Review of Systems  Constitutional: Negative for fatigue.  Eyes: Negative for visual disturbance.  Respiratory: Negative for cough, chest tightness, shortness of breath and wheezing.   Cardiovascular: Negative for chest pain, palpitations and leg swelling.  Neurological: Negative for dizziness, seizures, syncope, weakness, light-headedness and headaches.       Objective:   Physical Exam  Constitutional: She appears well-developed and well-nourished.  Neck: Neck supple. No thyromegaly present.  Cardiovascular: Normal rate and regular rhythm.     Pulmonary/Chest: Effort normal and breath sounds normal. No respiratory distress. She has no wheezes. She has no rales.  Musculoskeletal:  No pitting edema          Assessment & Plan:  #1 hypertension. Poorly controlled. Repeat right arm seated after rest 180/90. Add Toprol-XL 50 mg once daily. Continue losartan HCTZ and amlodipine. Monitor blood pressure closely. Schedule follow-up in 4 months but recommend repeat blood pressure in a few weeks #2 hyperlipidemia. Repeat lipids in 4 months. She plans to some weight in the meantime. She is reluctant to increase Lipitor at this time #3 type 2 diabetes. Suboptimal control. We recommended metformin but she is adamant that she would like to try to lose some weight 3-4 months and then reassess A1c in 4 months. If not improving at that time will strongly recommend metformin 500 milligrams twice a day

## 2015-07-11 ENCOUNTER — Other Ambulatory Visit: Payer: Self-pay | Admitting: Family Medicine

## 2015-08-07 ENCOUNTER — Other Ambulatory Visit: Payer: Self-pay | Admitting: Family Medicine

## 2015-08-15 ENCOUNTER — Telehealth: Payer: Self-pay | Admitting: *Deleted

## 2015-08-15 NOTE — Telephone Encounter (Signed)
Left message for pt to return call.

## 2015-08-15 NOTE — Telephone Encounter (Signed)
Please advise on what information is needed from patient  --------------------------------------------------------------------------------------------------------------------------------------------------------------------------------------------- PLEASE NOTE: All timestamps contained within this report are represented as [place] Standard Time. CONFIDENTIALTY NOTICE: This fax transmission is intended only for the addressee. It contains information that is legally privileged, confidential or otherwise protected from use or disclosure. If you are not the intended recipient, you are strictly prohibited from reviewing, disclosing, copying using or disseminating any of this information or taking any action in reliance on or regarding this information. If you have received this fax in error, please notify us immediately by telephone so that we can arrange for its return to us. Phone: [phone], Toll-Free: [phone], Fax: [phone] Page: 1 of 1 Call Id: [id number] [facility] Night - Client TELEPHONE ADVICE RECORD [facility] Patient Name: Joanne Mendoza Gender: Female DOB: 10-15-1945 Age: [age] Y 10 M 20 D Return Phone Number: [phone] (Primary) Address: City/State/Zip: [place] Client [facility] Night - Client Client Site [facility] - Night Physician Evelena PeatBurchette, Bruce Contact Type Call Caller Name Joanne Mendoza Caller Phone Number [phone] Relationship To Patient Self Is this call to report lab results? No Call Type General Information Initial Comment Caller states Joanne Sereneeresa Sloss dob 002-08-1946 cb# [phone] Dr left a message on Friday. Dr called her about some information he needed. If he does call her tomorrow and no answer, please leave a good time for her to call him back. General Information Type Message Only Nurse Assessment Guidelines Guideline Title Affirmed Question Affirmed Notes  Nurse Date/Time (Eastern Time) Disp. Time Lamount Cohen(Eastern Time) Disposition Final User 08/14/2015 8:45:20 PM General Information Provided Yes Lyda PeroneHooper, Lance After Care Instructions Given Call Event Type User Date / Time Description

## 2015-08-23 NOTE — Telephone Encounter (Signed)
Contact office letter mailed.

## 2015-09-26 ENCOUNTER — Other Ambulatory Visit: Payer: Self-pay | Admitting: Family Medicine

## 2015-10-17 ENCOUNTER — Other Ambulatory Visit: Payer: Self-pay | Admitting: Family Medicine

## 2015-10-20 ENCOUNTER — Other Ambulatory Visit: Payer: Medicare Other

## 2015-10-27 ENCOUNTER — Ambulatory Visit: Payer: Medicare Other | Admitting: Family Medicine

## 2015-10-27 ENCOUNTER — Other Ambulatory Visit: Payer: Self-pay | Admitting: Family Medicine

## 2015-11-03 ENCOUNTER — Other Ambulatory Visit: Payer: Medicare Other

## 2015-11-10 ENCOUNTER — Ambulatory Visit: Payer: Medicare Other | Admitting: Family Medicine

## 2015-11-24 ENCOUNTER — Other Ambulatory Visit: Payer: Medicare Other

## 2015-11-30 ENCOUNTER — Ambulatory Visit: Payer: Medicare Other | Admitting: Family Medicine

## 2015-12-22 ENCOUNTER — Other Ambulatory Visit: Payer: Medicare Other

## 2015-12-29 ENCOUNTER — Ambulatory Visit: Payer: Medicare Other | Admitting: Family Medicine

## 2016-01-19 ENCOUNTER — Other Ambulatory Visit (INDEPENDENT_AMBULATORY_CARE_PROVIDER_SITE_OTHER): Payer: Medicare Other

## 2016-01-19 DIAGNOSIS — IMO0001 Reserved for inherently not codable concepts without codable children: Secondary | ICD-10-CM

## 2016-01-19 DIAGNOSIS — E1165 Type 2 diabetes mellitus with hyperglycemia: Secondary | ICD-10-CM | POA: Diagnosis not present

## 2016-01-19 LAB — LIPID PANEL
CHOLESTEROL: 166 mg/dL (ref 0–200)
HDL: 35.2 mg/dL — ABNORMAL LOW (ref >39.00)
LDL CALC: 107 mg/dL — AB (ref 0–99)
NonHDL: 130.53
TRIGLYCERIDES: 116 mg/dL (ref 0.0–149.0)
Total CHOL/HDL Ratio: 5
VLDL: 23.2 mg/dL (ref 0.0–40.0)

## 2016-01-19 LAB — HEMOGLOBIN A1C: Hgb A1c MFr Bld: 7.3 % — ABNORMAL HIGH (ref 4.6–6.5)

## 2016-01-24 ENCOUNTER — Ambulatory Visit (INDEPENDENT_AMBULATORY_CARE_PROVIDER_SITE_OTHER): Payer: Medicare Other | Admitting: Family Medicine

## 2016-01-24 VITALS — BP 140/80 | HR 100 | Temp 97.6°F | Ht 65.0 in | Wt 262.0 lb

## 2016-01-24 DIAGNOSIS — I10 Essential (primary) hypertension: Secondary | ICD-10-CM

## 2016-01-24 DIAGNOSIS — E119 Type 2 diabetes mellitus without complications: Secondary | ICD-10-CM | POA: Diagnosis not present

## 2016-01-24 DIAGNOSIS — E785 Hyperlipidemia, unspecified: Secondary | ICD-10-CM | POA: Diagnosis not present

## 2016-01-24 NOTE — Progress Notes (Signed)
Pre visit review using our clinic review tool, if applicable. No additional management support is needed unless otherwise documented below in the visit note. 

## 2016-01-24 NOTE — Progress Notes (Signed)
   Subjective:    Patient ID: Joanne Mendoza, female    DOB: March 24, 1946, 70 y.o.   MRN: [id number]  HPI Patient here for routine medical follow-up  She has been doing some swimming for exercise and trying to reduce calorie intake. She has lost 8 pounds since she was here last fall. Hypertension with probable white coat syndrome. Even with checking blood pressure at home she feels very anxious. Usually gets around 140 systolic at home Currently on 3 drug regimen. Compliant with medications. No headaches. No chest pains.  Hyperlipidemia treated with Lipitor. No myalgias. Recent labs reviewed. Cholesterol at goal but she has low HDL  Type 2 diabetes. Recent A1c 7.3%.  Does not monitor blood sugars are often at home. No polyuria or polydipsia. She gets yearly eye exams. No neuropathy symptoms. She declined metformin last fall and hopes to get A1c to goal without medication  Past Medical History  Diagnosis Date  . Hypertension   . Hyperlipidemia    Past Surgical History  Procedure Laterality Date  . Cesarean section      x 2   . Broken arm      reports that she has never smoked. She does not have any smokeless tobacco history on file. She reports that she does not drink alcohol or use illicit drugs. family history includes Alcohol abuse in her father; Colon cancer in her paternal uncle; Hypertension in her mother; Pancreatic cancer in her paternal uncle. Allergies  Allergen Reactions  . Penicillins     REACTION: hives      Review of Systems  Constitutional: Negative for fatigue and unexpected weight change.  Eyes: Negative for visual disturbance.  Respiratory: Negative for cough, chest tightness, shortness of breath and wheezing.   Cardiovascular: Negative for chest pain, palpitations and leg swelling.  Gastrointestinal: Negative for abdominal pain.  Endocrine: Negative for polydipsia and polyuria.  Genitourinary: Negative for dysuria.  Neurological: Negative for dizziness,  seizures, syncope, weakness, light-headedness and headaches.       Objective:   Physical Exam  Constitutional: She appears well-developed and well-nourished.  Eyes: Pupils are equal, round, and reactive to light.  Neck: Neck supple. No JVD present. No thyromegaly present.  Cardiovascular: Normal rate and regular rhythm.  Exam reveals no gallop.   Pulmonary/Chest: Effort normal and breath sounds normal. No respiratory distress. She has no wheezes. She has no rales.  Musculoskeletal: She exhibits no edema.  Neurological: She is alert.          Assessment & Plan:  #1 type 2 diabetes. Recent A1c slightly improved 7.3%. We again today discussed possible addition of metformin but she wishes to give this 3-4 more months with additional weight loss and reassess.  #2 hypertension. Probable white coat syndrome. Repeat blood pressure today left arm seated large cuff 140/80. Continue weight loss efforts and low-sodium diet. Continue regular aerobic exercise  # 3 dyslipidemia. Cholesterol and LDL stable and improved. She has low HDL. Continue Lipitor  Kristian CoveyBruce W Burchette MD  [facility]

## 2016-02-01 ENCOUNTER — Other Ambulatory Visit: Payer: Self-pay | Admitting: Family Medicine

## 2016-03-02 ENCOUNTER — Telehealth: Payer: Self-pay

## 2016-03-02 NOTE — Telephone Encounter (Signed)
Patient is on the list for [employer] 2017 and may be a good candidate for an AWV in 2017. Please let me know if/when appt is scheduled.   

## 2016-04-21 ENCOUNTER — Other Ambulatory Visit: Payer: Self-pay | Admitting: Family Medicine

## 2016-05-22 ENCOUNTER — Ambulatory Visit: Payer: Medicare Other | Admitting: Family Medicine

## 2016-06-10 ENCOUNTER — Other Ambulatory Visit: Payer: Self-pay | Admitting: Family Medicine

## 2016-06-26 ENCOUNTER — Ambulatory Visit: Payer: Medicare Other | Admitting: Family Medicine

## 2016-07-17 ENCOUNTER — Ambulatory Visit: Payer: Medicare Other | Admitting: Family Medicine

## 2016-08-07 ENCOUNTER — Ambulatory Visit (INDEPENDENT_AMBULATORY_CARE_PROVIDER_SITE_OTHER): Payer: Medicare Other | Admitting: Family Medicine

## 2016-08-07 VITALS — BP 130/90 | HR 105 | Temp 97.9°F | Ht 65.0 in | Wt 261.9 lb

## 2016-08-07 DIAGNOSIS — I10 Essential (primary) hypertension: Secondary | ICD-10-CM | POA: Diagnosis not present

## 2016-08-07 DIAGNOSIS — E785 Hyperlipidemia, unspecified: Secondary | ICD-10-CM

## 2016-08-07 DIAGNOSIS — E119 Type 2 diabetes mellitus without complications: Secondary | ICD-10-CM

## 2016-08-07 DIAGNOSIS — Z23 Encounter for immunization: Secondary | ICD-10-CM | POA: Diagnosis not present

## 2016-08-07 LAB — POCT GLYCOSYLATED HEMOGLOBIN (HGB A1C): Hemoglobin A1C: 7.1

## 2016-08-07 NOTE — Progress Notes (Signed)
Subjective:     Patient ID: Joanne Mendoza, female   DOB: 1946-03-05, 70 y.o.   MRN: [id number]  HPI Patient seen for medical follow-up. She's had some recent problems with moisture damage to her house and has basically not been exercising much past couple of months because of this. She has struggled with her weight for many years. She has comorbidities including hypertension, type 2 diabetes, dyslipidemia. Last A1c 7.3%. She was reluctant to start diabetes medications. Does not monitor blood sugars regularly. No polyuria or polydipsia. Takes metoprolol, amlodipine, and losartan HCTZ for hypertension. She is on atorvastatin for hyperlipidemia. No myalgias. No recent chest pains. No dyspnea. Needs flu vaccine  Past Medical History:  Diagnosis Date  . Hyperlipidemia   . Hypertension    Past Surgical History:  Procedure Laterality Date  . broken arm    . CESAREAN SECTION     x 2     reports that she has never smoked. She does not have any smokeless tobacco history on file. She reports that she does not drink alcohol or use drugs. family history includes Alcohol abuse in her father; Colon cancer in her paternal uncle; Hypertension in her mother; Pancreatic cancer in her paternal uncle. Allergies  Allergen Reactions  . Penicillins     REACTION: hives     Review of Systems  Constitutional: Negative for fatigue and unexpected weight change.  Eyes: Negative for visual disturbance.  Respiratory: Negative for cough, chest tightness, shortness of breath and wheezing.   Cardiovascular: Negative for chest pain, palpitations and leg swelling.  Endocrine: Negative for polydipsia and polyuria.  Genitourinary: Negative for dysuria.  Neurological: Negative for dizziness, seizures, syncope, weakness, light-headedness and headaches.       Objective:   Physical Exam  Constitutional: She appears well-developed and well-nourished.  Neck: Neck supple.  Cardiovascular: Normal rate and regular rhythm.    Pulmonary/Chest: Effort normal and breath sounds normal. No respiratory distress. She has no wheezes. She has no rales.  Musculoskeletal: She exhibits no edema.  Lymphadenopathy:    She has no cervical adenopathy.  Psychiatric: She has a normal mood and affect. Her behavior is normal.       Assessment:     #1 type 2 diabetes slightly improved at 7.1%  #2 hypertension. Generally stable. Diastolic reading slightly higher today but generally well-controlled  #3 hyperlipidemia    Plan:     -flu vaccine given -Hemoglobin A1c as above -We discussed possible initiation of metformin and at this point she is reluctant and wishes to try further lifestyle modification and reassess A1c in 4 months. Reassess lipids then as well -Continue with sugar and starch reduction and weight loss -Try to get back to more regular swimming  Kristian CoveyBruce W Laquanta Hummel MD [facility]

## 2016-08-07 NOTE — Progress Notes (Signed)
Pre visit review using our clinic review tool, if applicable. No additional management support is needed unless otherwise documented below in the visit note. 

## 2016-10-17 ENCOUNTER — Other Ambulatory Visit: Payer: Self-pay | Admitting: Family Medicine

## 2016-11-05 ENCOUNTER — Other Ambulatory Visit: Payer: Self-pay | Admitting: Family Medicine

## 2016-12-11 ENCOUNTER — Other Ambulatory Visit: Payer: Self-pay | Admitting: Family Medicine

## 2016-12-16 ENCOUNTER — Ambulatory Visit: Payer: Medicare Other | Admitting: Family Medicine

## 2017-02-07 ENCOUNTER — Other Ambulatory Visit: Payer: Self-pay | Admitting: Family Medicine

## 2017-02-12 ENCOUNTER — Telehealth: Payer: Self-pay | Admitting: Family Medicine

## 2017-02-12 NOTE — Telephone Encounter (Signed)
° ° °  Pt said she will schedule her AWV before the end of the year

## 2017-02-14 ENCOUNTER — Ambulatory Visit: Payer: Medicare Other | Admitting: Family Medicine

## 2017-03-23 IMAGING — CR DG CHEST 2V
2 series · 2 of 2 positions shown · non-contrast
Comparison: Report from 08/06/2000 chest radiograph (images not
available).

CLINICAL DATA: Tachycardia.  Recent bronchitis.

EXAM:
CHEST  2 VIEW

[chest pa]
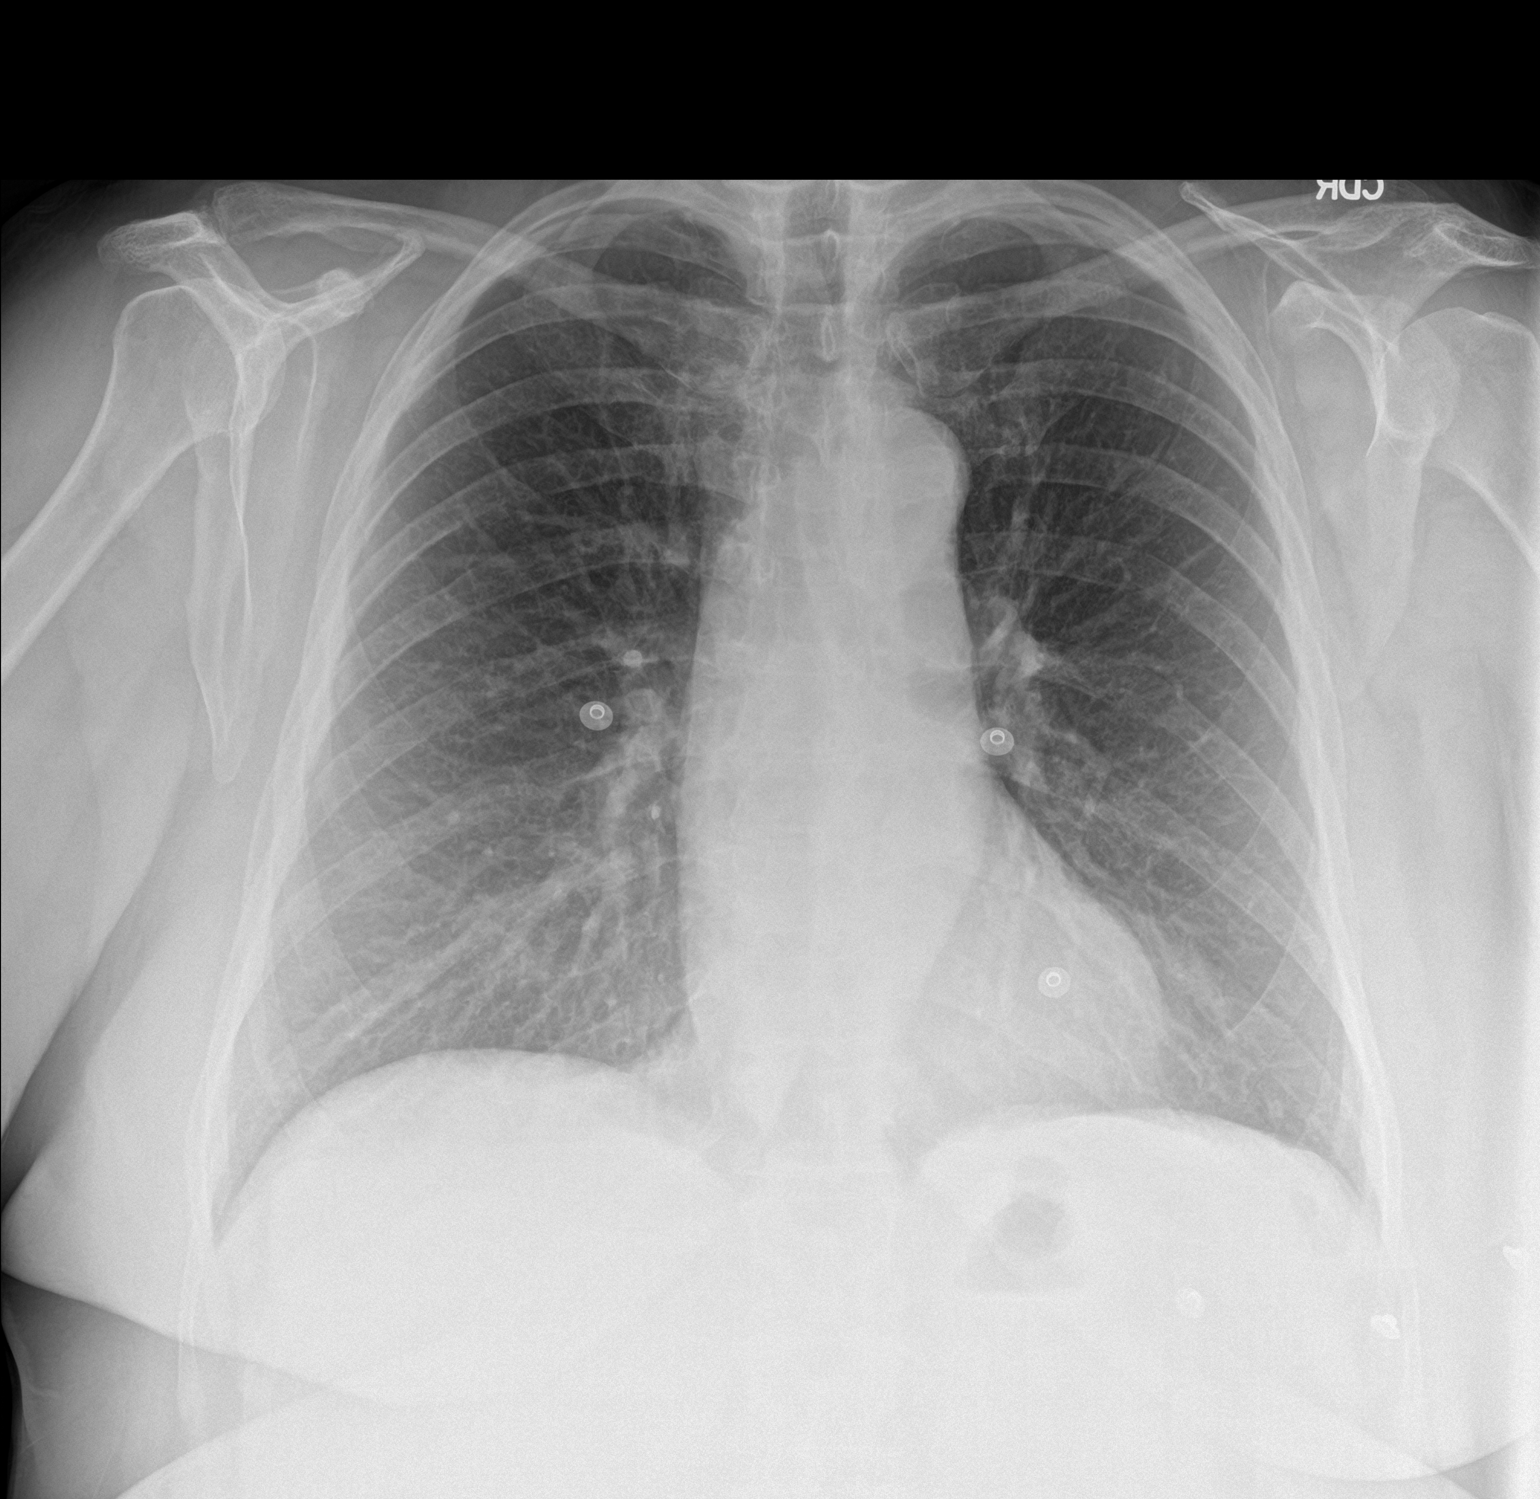

[chest lat]
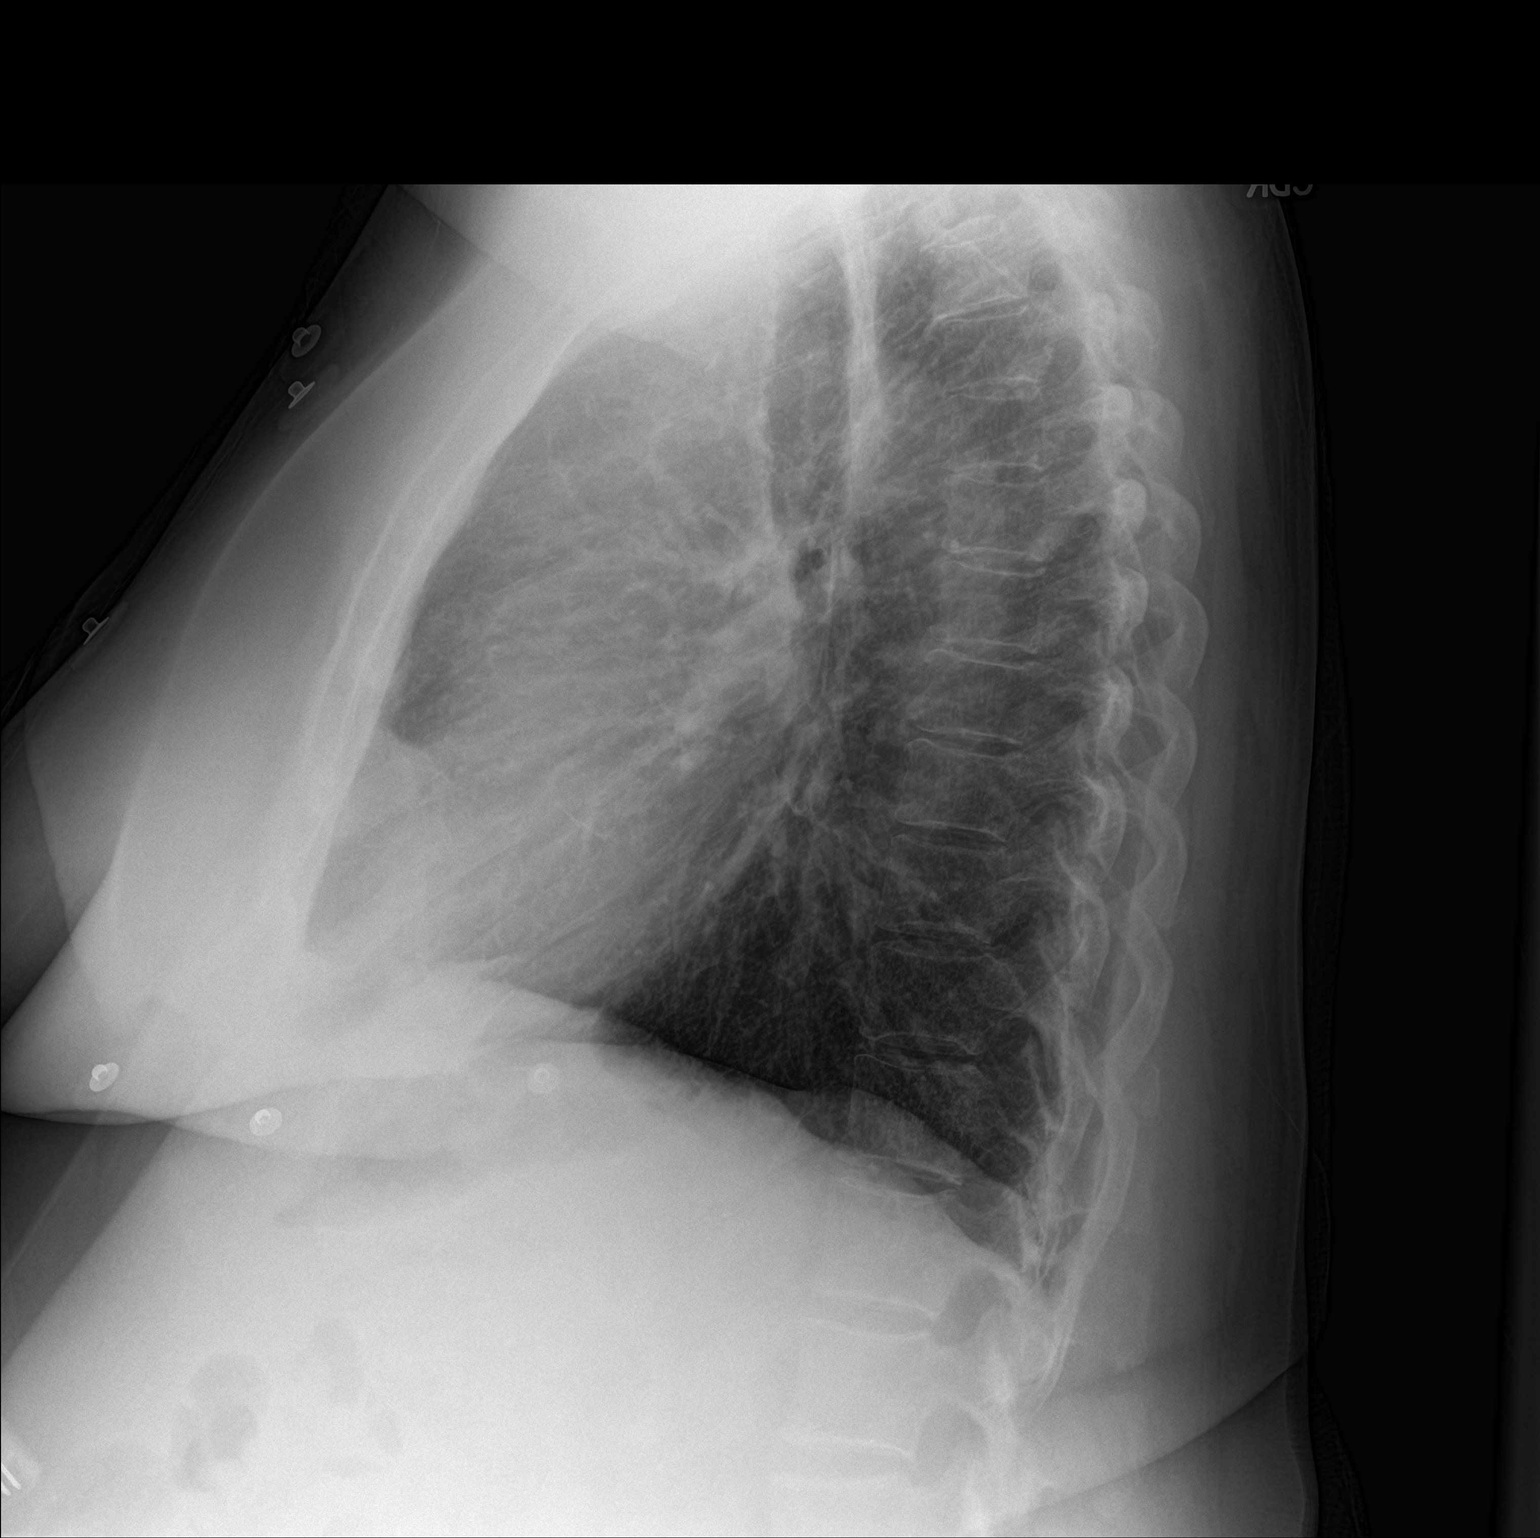

[2 of 2 positions shown; findings below may reference images not displayed]

FINDINGS: Normal heart size. Mildly tortuous thoracic aorta. Otherwise normal
mediastinal contour. No pneumothorax. No pleural effusion. Clear
lungs, with no focal lung consolidation and no pulmonary edema. Mild
degenerative changes in thoracic spine.
IMPRESSION: No active disease in the chest.

## 2017-03-28 ENCOUNTER — Ambulatory Visit: Payer: Medicare Other | Admitting: Family Medicine

## 2017-04-20 ENCOUNTER — Other Ambulatory Visit: Payer: Self-pay | Admitting: Family Medicine

## 2017-04-30 ENCOUNTER — Ambulatory Visit: Payer: Medicare Other | Admitting: Family Medicine

## 2017-05-09 ENCOUNTER — Other Ambulatory Visit: Payer: Self-pay | Admitting: Family Medicine

## 2017-05-23 ENCOUNTER — Encounter: Payer: Self-pay | Admitting: Family Medicine

## 2017-05-23 ENCOUNTER — Ambulatory Visit (INDEPENDENT_AMBULATORY_CARE_PROVIDER_SITE_OTHER): Payer: Medicare Other | Admitting: Family Medicine

## 2017-05-23 VITALS — BP 148/88 | HR 103 | Temp 98.3°F | Ht 65.0 in | Wt 267.4 lb

## 2017-05-23 DIAGNOSIS — Z23 Encounter for immunization: Secondary | ICD-10-CM

## 2017-05-23 DIAGNOSIS — E785 Hyperlipidemia, unspecified: Secondary | ICD-10-CM | POA: Diagnosis not present

## 2017-05-23 DIAGNOSIS — E119 Type 2 diabetes mellitus without complications: Secondary | ICD-10-CM | POA: Diagnosis not present

## 2017-05-23 DIAGNOSIS — I10 Essential (primary) hypertension: Secondary | ICD-10-CM | POA: Diagnosis not present

## 2017-05-23 LAB — LIPID PANEL
CHOLESTEROL: 201 mg/dL — AB (ref 0–200)
HDL: 41.2 mg/dL (ref >39.00)
LDL CALC: 128 mg/dL — AB (ref 0–99)
NonHDL: 160.24
Total CHOL/HDL Ratio: 5
Triglycerides: 161 mg/dL — ABNORMAL HIGH (ref 0.0–149.0)
VLDL: 32.2 mg/dL (ref 0.0–40.0)

## 2017-05-23 LAB — HEPATIC FUNCTION PANEL
ALBUMIN: 4.3 g/dL (ref 3.5–5.2)
ALT: 17 U/L (ref 0–35)
AST: 13 U/L (ref 0–37)
Alkaline Phosphatase: 78 U/L (ref 39–117)
Bilirubin, Direct: 0.2 mg/dL (ref 0.0–0.3)
TOTAL PROTEIN: 7 g/dL (ref 6.0–8.3)
Total Bilirubin: 1.6 mg/dL — ABNORMAL HIGH (ref 0.2–1.2)

## 2017-05-23 LAB — BASIC METABOLIC PANEL
BUN: 12 mg/dL (ref 6–23)
CHLORIDE: 101 meq/L (ref 96–112)
CO2: 27 mEq/L (ref 19–32)
Calcium: 9.8 mg/dL (ref 8.4–10.5)
Creatinine, Ser: 0.64 mg/dL (ref 0.40–1.20)
GFR: 97.6 mL/min (ref >60.00)
Glucose, Bld: 192 mg/dL — ABNORMAL HIGH (ref 70–99)
POTASSIUM: 4.2 meq/L (ref 3.5–5.1)
SODIUM: 136 meq/L (ref 135–145)

## 2017-05-23 LAB — HEMOGLOBIN A1C: HEMOGLOBIN A1C: 7.5 % — AB (ref 4.6–6.5)

## 2017-05-23 NOTE — Progress Notes (Signed)
Subjective:     Patient ID: Joanne Mendoza, female   DOB: 05-06-1946, 71 y.o.   MRN: [id number]  HPI Piece seen for medical follow-up. She has history of obesity, type 2 diabetes, hyperlipidemia, hypertension. Suspected white coat syndrome. She's been under stress recently with helping care for her cousin who had double lung transplant secondary to pulmonary fibrosis. This has kept her very busy. She also takes care of her mother.  A1c last year 7.3% with no recent follow-up. She still needs flu vaccine. Medications reviewed. Compliant with all.  Past Medical History:  Diagnosis Date  . Hyperlipidemia   . Hypertension    Past Surgical History:  Procedure Laterality Date  . broken arm    . CESAREAN SECTION     x 2     reports that she has never smoked. She has never used smokeless tobacco. She reports that she does not drink alcohol or use drugs. family history includes Alcohol abuse in her father; Colon cancer in her paternal uncle; Hyperlipidemia in her unknown relative; Hypertension in her mother; Pancreatic cancer in her paternal uncle. Allergies  Allergen Reactions  . Penicillins     REACTION: hives     Review of Systems  Constitutional: Negative for fatigue.  Eyes: Negative for visual disturbance.  Respiratory: Negative for cough, chest tightness, shortness of breath and wheezing.   Cardiovascular: Negative for chest pain, palpitations and leg swelling.  Endocrine: Negative for polydipsia and polyuria.  Neurological: Negative for dizziness, seizures, syncope, weakness, light-headedness and headaches.       Objective:   Physical Exam  Constitutional: She appears well-developed and well-nourished.  Eyes: Pupils are equal, round, and reactive to light.  Neck: Neck supple. No JVD present. No thyromegaly present.  Cardiovascular: Normal rate and regular rhythm.  Exam reveals no gallop.   Pulmonary/Chest: Effort normal and breath sounds normal. No respiratory distress. She  has no wheezes. She has no rales.  Musculoskeletal: She exhibits no edema.  Neurological: She is alert.       Assessment:     #1 hypertension. Slightly elevated today. Suspected white coat syndrome  #2 history of hyperglycemia. Patient currently not on medication  #3 dyslipidemia    Plan:     -Recheck labs with basic metabolic panel, lipid panel, hepatic panel, hemoglobin A1c -Strongly advised to lose some weight. -Consider initiating metformin if A1c still above 7 -Flu vaccine given -Monitor blood pressure at home and be in touch if consistently greater than 130/80 -Routine follow-up in 3 months  Kristian Covey MD [facility]

## 2017-05-23 NOTE — Patient Instructions (Signed)
Consider calorie tracking app such as [employer]  

## 2017-05-28 ENCOUNTER — Telehealth: Payer: Self-pay | Admitting: Family Medicine

## 2017-05-28 MED ORDER — ATORVASTATIN CALCIUM 40 MG PO TABS: 40.0000 mg | ORAL_TABLET | Freq: Every day | ORAL | 0 refills | Status: DC

## 2017-05-28 MED ORDER — METFORMIN HCL 500 MG PO TABS: 500.0000 mg | ORAL_TABLET | Freq: Two times a day (BID) | ORAL | 0 refills | Status: DC

## 2017-05-28 NOTE — Telephone Encounter (Signed)
Refill sent.

## 2017-05-28 NOTE — Telephone Encounter (Signed)
Pt is calling about the increase of lipitor that she spoke to Dr. Caryl Never about this morning she was wanting to get it today before she goes out of town late tonight early in the morning.  Pharm:  [facility]

## 2017-06-06 ENCOUNTER — Other Ambulatory Visit: Payer: Self-pay | Admitting: Family Medicine

## 2017-06-17 ENCOUNTER — Other Ambulatory Visit: Payer: Self-pay | Admitting: Family Medicine

## 2017-07-08 ENCOUNTER — Other Ambulatory Visit: Payer: Self-pay | Admitting: Family Medicine

## 2017-07-08 NOTE — Telephone Encounter (Signed)
Call pt is she taking just the 20 MG of Lipitor of the 40 MG dose?

## 2017-07-08 NOTE — Telephone Encounter (Signed)
Pt stated that she is still taking the 20 MG dose she is anxious about starting the new 40 MG dose pt stated that she plans to call back later with her concerns and questions for Dr. Caryl NeverBurchette.

## 2017-07-09 NOTE — Telephone Encounter (Signed)
Pt stated that she plans to contact her pharmacy today and will cal back on Thursday with her questions and concerns.

## 2017-07-22 ENCOUNTER — Other Ambulatory Visit: Payer: Self-pay | Admitting: Family Medicine

## 2017-08-05 ENCOUNTER — Other Ambulatory Visit: Payer: Self-pay | Admitting: Family Medicine

## 2017-08-23 ENCOUNTER — Other Ambulatory Visit: Payer: Self-pay | Admitting: Family Medicine

## 2017-08-25 NOTE — Telephone Encounter (Signed)
Pt called to let Dr. Caryl NeverBurchette know she doesn't need the refills at this time she started medication late due to being out of town and has an appt in Jan to see him and can do refills then

## 2017-08-25 NOTE — Telephone Encounter (Signed)
Noted. Refills refused.

## 2017-09-30 ENCOUNTER — Ambulatory Visit: Payer: Medicare Other | Admitting: Family Medicine

## 2017-10-03 ENCOUNTER — Ambulatory Visit: Payer: Medicare Other | Admitting: Family Medicine

## 2017-10-08 ENCOUNTER — Ambulatory Visit: Payer: Medicare Other | Admitting: Family Medicine

## 2017-10-08 ENCOUNTER — Encounter: Payer: Self-pay | Admitting: Family Medicine

## 2017-10-08 VITALS — BP 124/80 | HR 96 | Temp 97.4°F | Wt 259.4 lb

## 2017-10-08 DIAGNOSIS — E785 Hyperlipidemia, unspecified: Secondary | ICD-10-CM

## 2017-10-08 DIAGNOSIS — E119 Type 2 diabetes mellitus without complications: Secondary | ICD-10-CM

## 2017-10-08 DIAGNOSIS — I10 Essential (primary) hypertension: Secondary | ICD-10-CM | POA: Diagnosis not present

## 2017-10-08 DIAGNOSIS — E2839 Other primary ovarian failure: Secondary | ICD-10-CM | POA: Diagnosis not present

## 2017-10-08 DIAGNOSIS — Z0000 Encounter for general adult medical examination without abnormal findings: Secondary | ICD-10-CM | POA: Diagnosis not present

## 2017-10-08 DIAGNOSIS — Z1159 Encounter for screening for other viral diseases: Secondary | ICD-10-CM

## 2017-10-08 LAB — POCT GLYCOSYLATED HEMOGLOBIN (HGB A1C): HEMOGLOBIN A1C: 6.8

## 2017-10-08 NOTE — Progress Notes (Signed)
Subjective:     Patient ID: Joanne Mendoza, female   DOB: [birth year], 72 y.o.   MRN: [id number]  HPI Patient seen for medical follow-up. She has history of obesity, hyperlipidemia, type 2 diabetes, hypertension. She had recent A1c in the fall of 7.5%. We started metformin 500 mg twice daily. Lipids were poorly controlled, and we increased her Lipitor 40 mg daily. She's had no side effects. Her weight is down about 8 pounds. No consistent exercise. Poor compliance with diet at times.  Patient is compliant with medications. Denies any side effects. No recent chest pains. No dyspnea.  Past Medical History:  Diagnosis Date  . Hyperlipidemia   . Hypertension    Past Surgical History:  Procedure Laterality Date  . broken arm    . CESAREAN SECTION     x 2     reports that  has never smoked. she has never used smokeless tobacco. She reports that she does not drink alcohol or use drugs. family history includes Alcohol abuse in her father; Colon cancer in her paternal uncle; Hyperlipidemia in her unknown relative; Hypertension in her mother; Pancreatic cancer in her paternal uncle. Allergies  Allergen Reactions  . Penicillins     REACTION: hives     Review of Systems  Constitutional: Negative for fatigue.  Eyes: Negative for visual disturbance.  Respiratory: Negative for cough, chest tightness, shortness of breath and wheezing.   Cardiovascular: Negative for chest pain, palpitations and leg swelling.  Gastrointestinal: Negative for abdominal pain and diarrhea.  Endocrine: Negative for polydipsia and polyuria.  Neurological: Negative for dizziness, seizures, syncope, weakness, light-headedness and headaches.       Objective:   Physical Exam  Constitutional: She appears well-developed and well-nourished.  Eyes: Pupils are equal, round, and reactive to light.  Neck: Neck supple. No JVD present. No thyromegaly present.  Cardiovascular: Normal rate and regular rhythm. Exam reveals no  gallop.  Pulmonary/Chest: Effort normal and breath sounds normal. No respiratory distress. She has no wheezes. She has no rales.  Musculoskeletal: She exhibits no edema.  Neurological: She is alert.       Assessment:     #1 type 2 diabetes improved with A1c today 6.8%  #2 hypertension improved and at goal  #3 dyslipidemia    Plan:     -Recommend continued weight loss -We recommended she consider seeing diabetes educator and she'll consider -Continue current medications -Plan 4 month follow-up and repeat lipids then along with A1c  Kristian CoveyBruce W Avree Szczygiel MD [facility]

## 2017-10-08 NOTE — Progress Notes (Signed)
Subjective:   Joanne Mendoza is a 72 y.o. female who presents for Medicare Annual (Subsequent) preventive examination.  Reports health as good Taking care of double lung transplant  Has a 41 yo mother and 78 yo aunt with FX hip and in [place] They have a sister 38 yo  Is starting to set boundaries in her care giving role to be sure she is caring for herself    Diet Dx x 72 yo, has caused her a lot of anxiety  BMI 43  BP good  Breakfast has a banana and low cheese  Lunch; salad or sandwich or soup Supper cooks and go [employer] well Does not eat much beef Eats fish; salmon Does not eat many carbs  Does not eat a lot of sugar, but likes bread    Exercise Likes to swimming  Wants to get back to 3 days a week and agrees to do so which will assist her on her weight loss journey   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 Hepatitis C Screening  1945-12-01  . OPHTHALMOLOGY EXAM  09/24/1957  . MAMMOGRAM  09/24/1997  . COLONOSCOPY  09/24/1997  . DEXA SCAN  09/24/2012   Medicare now request all "baby boomers" test for possible exposure to Hepatitis C. Many may have been exposed due to dental work, tatoo's, vaccinations when young. The Hepatitis C virus is dormant for many years and then sometimes will cause liver cancer. If you gave blood in the past 15 years, you were most likely checked for Hep C. If you rec'd blood; you may want to consider testing or if you are high risk for any other reason.  Agreed to hep c at next blood draw   Eye exam - to schedule  Mammogram: to schedule  Colonoscopy due  Opts for colo-guard but after thinking it over, would like to discuss benefits vs risk with Dr. Caryl Never at her  Next visit   Shingrix is a vaccine for the prevention of Shingles in Adults 56 and older.  If you are on Medicare, you can request a prescription from your doctor to be filled at a pharmacy.  Please  check with your benefits regarding applicable copays or out of pocket expenses.  The Shingrix is given in 2 vaccines approx 8 weeks apart. You must receive the 2nd dose prior to 6 months from receipt of the first.     Cardiac Risk Factors include: diabetes mellitus;dyslipidemia;hypertension    Objective:     Vitals: BP 124/80 (BP Location: Left Arm, Patient Position: Sitting, Cuff Size: Large)   Pulse 96   Temp (!) 97.4 F (36.3 C) (Oral)   Wt 259 lb 6.4 oz (117.7 kg)   SpO2 96%   BMI 43.17 kg/m   Body mass index is 43.17 kg/m.  Advanced Directives 10/08/2017 06/05/2015  Does Patient Have a Medical Advance Directive? No No  Would patient like information on creating a medical advance directive? - No - patient declined information   Advanced Directive; Reviewed advanced directive and agreed to receipt of information and discussion.  Focused face to face x  20 minutes discussing HCPOA and Living will and reviewed all the questions in the [facility] forms. The patient voices understanding of HCPOA; LW reviewed and information provided on each question. Educated on how to revoke this HCPOA or LW at any time.  Also  discussed life prolonging measures (given a few examples) and where she could choose to initiate or not;  the ability to give the HCPOA power to change her  living will or not if she cannot speak for herself; as well as finalizing the will by 2 unrelated witnesses and notary.  Will call for questions and given information on [facility] pastoral department for further assistance.  Will bring this in to discuss with Dr. Caryl Never and we will make a copy for the office     Tobacco Social History   Tobacco Use  Smoking Status Never Smoker  Smokeless Tobacco Never Used     Counseling given: Yes   Clinical Intake:   Past Medical History:  Diagnosis Date  . Hyperlipidemia   . Hypertension    Past Surgical History:  Procedure Laterality Date  . broken arm    .  CESAREAN SECTION     x 2    Family History  Problem Relation Age of Onset  . Alcohol abuse Father   . Hyperlipidemia Unknown        Grandparent   . Hypertension Mother   . Colon cancer Paternal Uncle   . Pancreatic cancer Paternal Uncle    Social History   Socioeconomic History  . Marital status: Divorced    Spouse name: None  . Number of children: None  . Years of education: None  . Highest education level: None  Social Needs  . Financial resource strain: None  . Food insecurity - worry: None  . Food insecurity - inability: None  . Transportation needs - medical: None  . Transportation needs - non-medical: None  Occupational History  . None  Tobacco Use  . Smoking status: Never Smoker  . Smokeless tobacco: Never Used  Substance and Sexual Activity  . Alcohol use: No  . Drug use: No  . Sexual activity: None  Other Topics Concern  . None  Social History Narrative  . None    Outpatient Encounter Medications as of 10/08/2017  Medication Sig  . amLODipine (NORVASC) 5 MG tablet TAKE 1 TABLET BY MOUTH EVERY DAY  . atorvastatin (LIPITOR) 40 MG tablet Take 1 tablet (40 mg total) by mouth daily.  Marland Kitchen losartan-hydrochlorothiazide (HYZAAR) 100-12.5 MG tablet TAKE 1 TABLET BY MOUTH EVERY DAY  . metFORMIN (GLUCOPHAGE) 500 MG tablet Take 1 tablet (500 mg total) by mouth 2 (two) times daily with a meal.  . metoprolol succinate (TOPROL-XL) 50 MG 24 hr tablet TAKE 1 TABLET BY MOUTH EVERY DAY WITH OR IMMEDIATELY FOLLOWING A MEAL  . [DISCONTINUED] atorvastatin (LIPITOR) 20 MG tablet TAKE 1 TABLET BY MOUTH DAILY   No facility-administered encounter medications on file as of 10/08/2017.     Activities of Daily Living In your present state of health, do you have any difficulty performing the following activities: 10/08/2017  Hearing? N  Vision? N  Difficulty concentrating or making decisions? N  Walking or climbing stairs? N  Dressing or bathing? N  Doing errands, shopping? N    Preparing Food and eating ? N  Using the Toilet? N  In the past six months, have you accidently leaked urine? N  Do you have problems with loss of bowel control? N  Managing your Medications? N  Managing your Finances? N  Housekeeping or managing your Housekeeping? N  Some recent data might be hidden    Patient Care Team: Kristian Covey, MD as PCP - General    Assessment:  This is a routine wellness examination for Joanne Mendoza.  Exercise Activities and Dietary recommendations Current Exercise Habits: Structured exercise class, Type of exercise: Other - see comments(swimming ), Time (Minutes): 45, Frequency (Times/Week): 3, Weekly Exercise (Minutes/Week): 135, Intensity: Moderate  Goals    . Weight (lb) < 200 lb (90.7 kg)     Set a 2 to 3 month goal;  2 lbs weight loss a month  Will try to go back to swimming   Swims at 8:30pm; try to schedule in        Fall Risk Fall Risk  10/08/2017 08/07/2016 05/30/2015 11/02/2013  Falls in the past year? No No No No     Depression Screen PHQ 2/9 Scores 10/08/2017 08/07/2016 05/30/2015 11/02/2013  PHQ - 2 Score 0 0 0 0     Cognitive Function   Ad8 score reviewed for issues:  Issues making decisions:  Less interest in hobbies / activities:  Repeats questions, stories (family complaining):  Trouble using ordinary gadgets (microwave, computer, phone):  Forgets the month or year:   Mismanaging finances:   Remembering appts:  Daily problems with thinking and/or memory: Ad8 score is=0 Managing her life and that of her mother , aunts and other family members with complex needs.    Immunization History  Administered Date(s) Administered  . Influenza, High Dose Seasonal PF 06/27/2015, 08/07/2016, 05/23/2017  . Influenza,inj,Quad PF,6+ Mos 05/20/2013, 07/14/2014  . Pneumococcal Conjugate-13 06/27/2015  . Pneumococcal Polysaccharide-23 03/22/2013  . Tdap 03/22/2013  . Zoster 05/20/2013     Screening Tests Health Maintenance   Topic Date Due  . Hepatitis C Screening  1946/07/22  . OPHTHALMOLOGY EXAM  09/24/1957  . MAMMOGRAM  09/24/1997  . COLONOSCOPY  09/24/1997  . DEXA SCAN  09/24/2012  . HEMOGLOBIN A1C  11/20/2017  . FOOT EXAM  05/23/2018  . TETANUS/TDAP  03/23/2023  . INFLUENZA VACCINE  Completed  . PNA vac Low Risk Adult  Completed         Plan:      PCP Notes   Health Maintenance 1. Will have hep c drawn at the next blood draw 2. Will schedule an eye exam 3. Will schedule dexa and mammogram 4. Educated regarding the cologuard; was considering and then decided she would discuss with Dr. Caryl Never at her visit in 3 to 4 months.  5. Educated regarding the shingrix  6. Educated regarding the HCPOA and LW;  Given [place] form  7. Educated regarding the shingrix Will postpone in epic except for colonoscopy   Abnormal Screens  None   Referrals  None; she prefers to schedule her apts. Wants to wait until after March the 8th due to other obligations    Patient concerns; Set a goal to lose weight; has lost 8 lbs and agrees to start swimming again which reduces her weight as well   Nurse Concerns; As noted   Next PCP apt To be scheduled in 3 to 4 months.     I have personally reviewed and noted the following in the patient's chart:   . Medical and social history . Use of alcohol, tobacco or illicit drugs  . Current medications and supplements . Functional ability and status . Nutritional status . Physical activity . Advanced directives . List of other physicians . Hospitalizations, surgeries, and ER visits in previous 12 months . Vitals . Screenings to include cognitive, depression, and falls . Referrals and appointments  In addition, I have reviewed and discussed with patient certain preventive protocols,  quality metrics, and best practice recommendations. A written personalized care plan for preventive services as well as general preventive health recommendations were  provided to patient.     Demone Lyles, RN  10/08/2017  Agree with assessment as above.  Kristian CoveyBruce W Burchette MD [facility]

## 2017-10-08 NOTE — Patient Instructions (Addendum)
Ms. Keenum , Thank you for taking time to come for your Medicare Wellness Visit. I appreciate your ongoing commitment to your health goals. Please review the following plan we discussed and let me know if I can assist you in the future.   Medicare now request all "baby boomers" test for possible exposure to Hepatitis C. Many may have been exposed due to dental work, tatoo's, vaccinations when young. The Hepatitis C virus is dormant for many years and then sometimes will cause liver cancer. If you gave blood in the past 15 years, you were most likely checked for Hep C. If you rec'd blood; you may want to consider testing or if you are high risk for any other reason.   Please schedule an eye exam.   Please schedule your mammogram  Will schedule a bone density. Ms. Spurrier will call and schedule.   Medicare Part B will cover the CologuardTM test once every three years for beneficiaries who meet all of the following criteria:  Age 70 to 108 years,  Asymptomatic (no signs or symptoms of colorectal disease including but not limited to lower gastrointestinal pain, blood in stool, positive guaiac fecal occult blood test or fecal immunochemical test), and  At average risk of developing colorectal cancer (no personal history of adenomatous polyps, colorectal cancer, or inflammatory bowel disease, including Crohn's Disease and ulcerative colitis; no family history of colorectal cancers or adenomatous polyps, familial adenomatous polyposis, or hereditary nonpolyposis colorectal cancer).  Will order the cologuard and if it is not delivered within 2 weeks.  Manuela Schwartz will not not to call prior to March 8th   Shingrix is a vaccine for the prevention of Shingles in Adults 71 and older.  If you are on Medicare, you can request a prescription from your doctor to be filled at a pharmacy.  Please check with your benefits regarding applicable copays or out of pocket expenses.  The Shingrix is given in 2 vaccines approx  8 weeks apart. You must receive the 2nd dose prior to 6 months from receipt of the first.   Will try to complete AD; Given copy  Referred to [facility] for questions [place] offers free advance directive forms, as well as assistance in completing the forms themselves. For assistance, contact the Spiritual Care Department at [phone], or the Clinical Social Work Department at [phone].   '  These are the goals we discussed: Goals    . Weight (lb) < 200 lb (90.7 kg)     Set a 2 to 3 month goal;  2 lbs weight loss a month  Will try to go back to swimming   Swims at 8:30pm; try to schedule in        This is a list of the screening recommended for you and due dates:  Health Maintenance  Topic Date Due  .  Hepatitis C: One time screening is recommended by Center for Disease Control  (CDC) for  adults born from [birth year] through 1963.   1946-01-14  . Eye exam for diabetics  09/24/1957  . Mammogram  09/24/1997  . Colon Cancer Screening  09/24/1997  . DEXA scan (bone density measurement)  09/24/2012  . Hemoglobin A1C  11/20/2017  . Complete foot exam   05/23/2018  . Tetanus Vaccine  03/23/2023  . Flu Shot  Completed  . Pneumonia vaccines  Completed      Health Maintenance, Female Adopting a healthy lifestyle and getting preventive care can go a long way to promote health  and wellness. Talk with your health care provider about what schedule of regular examinations is right for you. This is a good chance for you to check in with your provider about disease prevention and staying healthy. In between checkups, there are plenty of things you can do on your own. Experts have done a lot of research about which lifestyle changes and preventive measures are most likely to keep you healthy. Ask your health care provider for more information. Weight and diet Eat a healthy diet  Be sure to include plenty of vegetables, fruits, low-fat dairy products, and lean protein.  Do not eat a lot of  foods high in solid fats, added sugars, or salt.  Get regular exercise. This is one of the most important things you can do for your health. ? Most adults should exercise for at least 150 minutes each week. The exercise should increase your heart rate and make you sweat (moderate-intensity exercise). ? Most adults should also do strengthening exercises at least twice a week. This is in addition to the moderate-intensity exercise.  Maintain a healthy weight  Body mass index (BMI) is a measurement that can be used to identify possible weight problems. It estimates body fat based on height and weight. Your health care provider can help determine your BMI and help you achieve or maintain a healthy weight.  For females 51 years of age and older: ? A BMI below 18.5 is considered underweight. ? A BMI of 18.5 to 24.9 is normal. ? A BMI of 25 to 29.9 is considered overweight. ? A BMI of 30 and above is considered obese.  Watch levels of cholesterol and blood lipids  You should start having your blood tested for lipids and cholesterol at 70 years of age, then have this test every 5 years.  You may need to have your cholesterol levels checked more often if: ? Your lipid or cholesterol levels are high. ? You are older than 70 years of age. ? You are at high risk for heart disease.  Cancer screening Lung Cancer  Lung cancer screening is recommended for adults 44-75 years old who are at high risk for lung cancer because of a history of smoking.  A yearly low-dose CT scan of the lungs is recommended for people who: ? Currently smoke. ? Have quit within the past 15 years. ? Have at least a 30-pack-year history of smoking. A pack year is smoking an average of one pack of cigarettes a day for 1 year.  Yearly screening should continue until it has been 15 years since you quit.  Yearly screening should stop if you develop a health problem that would prevent you from having lung cancer  treatment.  Breast Cancer  Practice breast self-awareness. This means understanding how your breasts normally appear and feel.  It also means doing regular breast self-exams. Let your health care provider know about any changes, no matter how small.  If you are in your 20s or 30s, you should have a clinical breast exam (CBE) by a health care provider every 1-3 years as part of a regular health exam.  If you are 16 or older, have a CBE every year. Also consider having a breast X-ray (mammogram) every year.  If you have a family history of breast cancer, talk to your health care provider about genetic screening.  If you are at high risk for breast cancer, talk to your health care provider about having an MRI and a mammogram every year.  Breast cancer gene (BRCA) assessment is recommended for women who have family members with BRCA-related cancers. BRCA-related cancers include: ? Breast. ? Ovarian. ? Tubal. ? Peritoneal cancers.  Results of the assessment will determine the need for genetic counseling and BRCA1 and BRCA2 testing.  Cervical Cancer Your health care provider may recommend that you be screened regularly for cancer of the pelvic organs (ovaries, uterus, and vagina). This screening involves a pelvic examination, including checking for microscopic changes to the surface of your cervix (Pap test). You may be encouraged to have this screening done every 3 years, beginning at age 82.  For women ages 68-65, health care providers may recommend pelvic exams and Pap testing every 3 years, or they may recommend the Pap and pelvic exam, combined with testing for human papilloma virus (HPV), every 5 years. Some types of HPV increase your risk of cervical cancer. Testing for HPV may also be done on women of any age with unclear Pap test results.  Other health care providers may not recommend any screening for nonpregnant women who are considered low risk for pelvic cancer and who do not have  symptoms. Ask your health care provider if a screening pelvic exam is right for you.  If you have had past treatment for cervical cancer or a condition that could lead to cancer, you need Pap tests and screening for cancer for at least 20 years after your treatment. If Pap tests have been discontinued, your risk factors (such as having a new sexual partner) need to be reassessed to determine if screening should resume. Some women have medical problems that increase the chance of getting cervical cancer. In these cases, your health care provider may recommend more frequent screening and Pap tests.  Colorectal Cancer  This type of cancer can be detected and often prevented.  Routine colorectal cancer screening usually begins at 70 years of age and continues through 70 years of age.  Your health care provider may recommend screening at an earlier age if you have risk factors for colon cancer.  Your health care provider may also recommend using home test kits to check for hidden blood in the stool.  A small camera at the end of a tube can be used to examine your colon directly (sigmoidoscopy or colonoscopy). This is done to check for the earliest forms of colorectal cancer.  Routine screening usually begins at age 76.  Direct examination of the colon should be repeated every 5-10 years through 70 years of age. However, you may need to be screened more often if early forms of precancerous polyps or small growths are found.  Skin Cancer  Check your skin from head to toe regularly.  Tell your health care provider about any new moles or changes in moles, especially if there is a change in a mole's shape or color.  Also tell your health care provider if you have a mole that is larger than the size of a pencil eraser.  Always use sunscreen. Apply sunscreen liberally and repeatedly throughout the day.  Protect yourself by wearing long sleeves, pants, a wide-brimmed hat, and sunglasses whenever you  are outside.  Heart disease, diabetes, and high blood pressure  High blood pressure causes heart disease and increases the risk of stroke. High blood pressure is more likely to develop in: ? People who have blood pressure in the high end of the normal range (130-139/85-89 mm Hg). ? People who are overweight or obese. ? People who are African American.  If you are 22-27 years of age, have your blood pressure checked every 3-5 years. If you are 74 years of age or older, have your blood pressure checked every year. You should have your blood pressure measured twice-once when you are at a hospital or clinic, and once when you are not at a hospital or clinic. Record the average of the two measurements. To check your blood pressure when you are not at a hospital or clinic, you can use: ? An automated blood pressure machine at a pharmacy. ? A home blood pressure monitor.  If you are between 45 years and 28 years old, ask your health care provider if you should take aspirin to prevent strokes.  Have regular diabetes screenings. This involves taking a blood sample to check your fasting blood sugar level. ? If you are at a normal weight and have a low risk for diabetes, have this test once every three years after 70 years of age. ? If you are overweight and have a high risk for diabetes, consider being tested at a younger age or more often. Preventing infection Hepatitis B  If you have a higher risk for hepatitis B, you should be screened for this virus. You are considered at high risk for hepatitis B if: ? You were born in a country where hepatitis B is common. Ask your health care provider which countries are considered high risk. ? Your parents were born in a high-risk country, and you have not been immunized against hepatitis B (hepatitis B vaccine). ? You have HIV or AIDS. ? You use needles to inject street drugs. ? You live with someone who has hepatitis B. ? You have had sex with someone who  has hepatitis B. ? You get hemodialysis treatment. ? You take certain medicines for conditions, including cancer, organ transplantation, and autoimmune conditions.  Hepatitis C  Blood testing is recommended for: ? Everyone born from [birth year] through 1965. ? Anyone with known risk factors for hepatitis C.  Sexually transmitted infections (STIs)  You should be screened for sexually transmitted infections (STIs) including gonorrhea and chlamydia if: ? You are sexually active and are younger than 70 years of age. ? You are older than 70 years of age and your health care provider tells you that you are at risk for this type of infection. ? Your sexual activity has changed since you were last screened and you are at an increased risk for chlamydia or gonorrhea. Ask your health care provider if you are at risk.  If you do not have HIV, but are at risk, it may be recommended that you take a prescription medicine daily to prevent HIV infection. This is called pre-exposure prophylaxis (PrEP). You are considered at risk if: ? You are sexually active and do not regularly use condoms or know the HIV status of your partner(s). ? You take drugs by injection. ? You are sexually active with a partner who has HIV.  Talk with your health care provider about whether you are at high risk of being infected with HIV. If you choose to begin PrEP, you should first be tested for HIV. You should then be tested every 3 months for as long as you are taking PrEP. Pregnancy  If you are premenopausal and you may become pregnant, ask your health care provider about preconception counseling.  If you may become pregnant, take 400 to 800 micrograms (mcg) of folic acid every day.  If you want to prevent pregnancy, talk to  your health care provider about birth control (contraception). Osteoporosis and menopause  Osteoporosis is a disease in which the bones lose minerals and strength with aging. This can result in serious bone  fractures. Your risk for osteoporosis can be identified using a bone density scan.  If you are 26 years of age or older, or if you are at risk for osteoporosis and fractures, ask your health care provider if you should be screened.  Ask your health care provider whether you should take a calcium or vitamin D supplement to lower your risk for osteoporosis.  Menopause may have certain physical symptoms and risks.  Hormone replacement therapy may reduce some of these symptoms and risks. Talk to your health care provider about whether hormone replacement therapy is right for you. Follow these instructions at home:  Schedule regular health, dental, and eye exams.  Stay current with your immunizations.  Do not use any tobacco products including cigarettes, chewing tobacco, or electronic cigarettes.  If you are pregnant, do not drink alcohol.  If you are breastfeeding, limit how much and how often you drink alcohol.  Limit alcohol intake to no more than 1 drink per day for nonpregnant women. One drink equals 12 ounces of beer, 5 ounces of wine, or 1 ounces of hard liquor.  Do not use street drugs.  Do not share needles.  Ask your health care provider for help if you need support or information about quitting drugs.  Tell your health care provider if you often feel depressed.  Tell your health care provider if you have ever been abused or do not feel safe at home. This information is not intended to replace advice given to you by your health care provider. Make sure you discuss any questions you have with your health care provider. Document Released: 03/04/2011 Document Revised: 01/25/2016 Document Reviewed: 05/23/2015 Elsevier Interactive Patient Education  2018 [place] in the Home Falls can cause injuries and can affect people from all age groups. There are many simple things that you can do to make your home safe and to help prevent falls. What can I do  on the outside of my home?  Regularly repair the edges of walkways and driveways and fix any cracks.  Remove high doorway thresholds.  Trim any shrubbery on the main path into your home.  Use bright outdoor lighting.  Clear walkways of debris and clutter, including tools and rocks.  Regularly check that handrails are securely fastened and in good repair. Both sides of any steps should have handrails.  Install guardrails along the edges of any raised decks or porches.  Have leaves, snow, and ice cleared regularly.  Use sand or salt on walkways during winter months.  In the garage, clean up any spills right away, including grease or oil spills. What can I do in the bathroom?  Use night lights.  Install grab bars by the toilet and in the tub and shower. Do not use towel bars as grab bars.  Use non-skid mats or decals on the floor of the tub or shower.  If you need to sit down while you are in the shower, use a plastic, non-slip stool.  Keep the floor dry. Immediately clean up any water that spills on the floor.  Remove soap buildup in the tub or shower on a regular basis.  Attach bath mats securely with double-sided non-slip rug tape.  Remove throw rugs and other tripping hazards from the floor. What  can I do in the bedroom?  Use night lights.  Make sure that a bedside light is easy to reach.  Do not use oversized bedding that drapes onto the floor.  Have a firm chair that has side arms to use for getting dressed.  Remove throw rugs and other tripping hazards from the floor. What can I do in the kitchen?  Clean up any spills right away.  Avoid walking on wet floors.  Place frequently used items in easy-to-reach places.  If you need to reach for something above you, use a sturdy step stool that has a grab bar.  Keep electrical cables out of the way.  Do not use floor polish or wax that makes floors slippery. If you have to use wax, make sure that it is  non-skid floor wax.  Remove throw rugs and other tripping hazards from the floor. What can I do in the stairways?  Do not leave any items on the stairs.  Make sure that there are handrails on both sides of the stairs. Fix handrails that are broken or loose. Make sure that handrails are as long as the stairways.  Check any carpeting to make sure that it is firmly attached to the stairs. Fix any carpet that is loose or worn.  Avoid having throw rugs at the top or bottom of stairways, or secure the rugs with carpet tape to prevent them from moving.  Make sure that you have a light switch at the top of the stairs and the bottom of the stairs. If you do not have them, have them installed. What are some other fall prevention tips?  Wear closed-toe shoes that fit well and support your feet. Wear shoes that have rubber soles or low heels.  When you use a stepladder, make sure that it is completely opened and that the sides are firmly locked. Have someone hold the ladder while you are using it. Do not climb a closed stepladder.  Add color or contrast paint or tape to grab bars and handrails in your home. Place contrasting color strips on the first and last steps.  Use mobility aids as needed, such as canes, walkers, scooters, and crutches.  Turn on lights if it is dark. Replace any light bulbs that burn out.  Set up furniture so that there are clear paths. Keep the furniture in the same spot.  Fix any uneven floor surfaces.  Choose a carpet design that does not hide the edge of steps of a stairway.  Be aware of any and all pets.  Review your medicines with your healthcare provider. Some medicines can cause dizziness or changes in blood pressure, which increase your risk of falling. Talk with your health care provider about other ways that you can decrease your risk of falls. This may include working with a physical therapist or trainer to improve your strength, balance, and endurance. This  information is not intended to replace advice given to you by your health care provider. Make sure you discuss any questions you have with your health care provider. Document Released: 08/09/2002 Document Revised: 01/16/2016 Document Reviewed: 09/23/2014 Elsevier Interactive Patient Education  [employer].

## 2017-10-10 ENCOUNTER — Telehealth: Payer: Self-pay | Admitting: Family Medicine

## 2017-10-10 ENCOUNTER — Other Ambulatory Visit: Payer: Self-pay | Admitting: Family Medicine

## 2017-10-10 NOTE — Telephone Encounter (Unsigned)
Copied from CRM [phone]. Topic: Quick Communication - Rx Refill/Question >> Oct 10, 2017 10:55 AM Percival SpanishKennedy, Cheryl W wrote: Medication     atorvastatin (LIPITOR) 40 MG tablet    metFORMIN (GLUCOPHAGE) 500 MG tablet    preferred Pharmacy      [facility] [place]    ent: Please be advised that RX refills may take up to 3 business days. We ask that you follow-up with your pharmacy.

## 2017-10-10 NOTE — Telephone Encounter (Unsigned)
Copied from CRM [phone]. Topic: Quick Communication - Rx Refill/Question >> Oct 10, 2017 10:57 AM Percival SpanishKennedy, Cheryl W wrote: Medication    atorvastatin (LIPITOR) 40 MG tablet     metFORMIN (GLUCOPHAGE) 500 MG tablet   Preferred Pharmacy  [facility] [place]    Agent: Please be advised that RX refills may take up to 3 business days. We ask that you follow-up with your pharmacy.

## 2017-11-24 ENCOUNTER — Other Ambulatory Visit: Payer: Self-pay | Admitting: Family Medicine

## 2017-11-24 ENCOUNTER — Telehealth: Payer: Self-pay | Admitting: Family Medicine

## 2017-11-24 NOTE — Telephone Encounter (Signed)
Per LOV with Dr. Caryl NeverBurchette on 10/08/2017 patient changed to 40mg  Lipitor / Patient is asking to speak with Dr. Lucie LeatherBurchette's nurse only /

## 2017-11-24 NOTE — Telephone Encounter (Signed)
Copied from CRM [phone]. Topic: Quick Communication - Rx Refill/Question >> Nov 24, 2017  3:49 PM Everardo PacificMoton, Jiovanny Burdell, [place] wrote: Medication: Lipitor 40mg  Has the patient contacted their pharmacy? Yes Patient is calling because she needs to get a refill on her above medication was unsure if she was supposed to have 40 mg or 20 mg of the medication.Stated she has been in contact with her pharmacy as well.Also she would like to speak with Dr.Burchettes nurse as well Preferred Pharmacy (with phone number or street name): [facility] [address] [place] [phone]   Agent: Please be advised that RX refills may take up to 3 business days. We ask that you follow-up with your pharmacy.

## 2017-11-25 MED ORDER — ATORVASTATIN CALCIUM 40 MG PO TABS: ORAL_TABLET | ORAL | 1 refills | Status: DC

## 2017-11-25 NOTE — Telephone Encounter (Signed)
Spoke with patient and Lipitor 40 mg sent to pharmacy.

## 2017-12-14 ENCOUNTER — Other Ambulatory Visit: Payer: Self-pay | Admitting: Family Medicine

## 2017-12-16 ENCOUNTER — Telehealth: Payer: Self-pay | Admitting: Family Medicine

## 2017-12-16 NOTE — Telephone Encounter (Signed)
Pt calling to follow up on this request because she is going out of town and will need this before she gets back.  All her other meds sent in but this.  amLODipine (NORVASC) 5 MG tablet  [id number] - [place], [place] - [address] [phone] (Phone) [phone] (Fax)

## 2017-12-17 NOTE — Telephone Encounter (Addendum)
Relation to pt: self Call back [phone]   Reason for call:  Patient wanted to inform she made a mistake and she didn't need the medication mentioned below.

## 2017-12-17 NOTE — Telephone Encounter (Signed)
Medication was sent e-scribe on 12/16/2017

## 2018-01-07 ENCOUNTER — Other Ambulatory Visit: Payer: Self-pay | Admitting: Family Medicine

## 2018-01-17 ENCOUNTER — Other Ambulatory Visit: Payer: Self-pay | Admitting: Family Medicine

## 2018-02-21 ENCOUNTER — Other Ambulatory Visit: Payer: Self-pay | Admitting: Family Medicine

## 2018-03-23 ENCOUNTER — Ambulatory Visit: Payer: Medicare Other | Admitting: Family Medicine

## 2018-04-14 ENCOUNTER — Ambulatory Visit: Payer: Medicare Other | Admitting: Family Medicine

## 2018-04-24 ENCOUNTER — Encounter: Payer: Self-pay | Admitting: Family Medicine

## 2018-04-24 ENCOUNTER — Ambulatory Visit: Payer: Medicare Other | Admitting: Family Medicine

## 2018-04-24 VITALS — BP 120/78 | HR 88 | Temp 98.3°F | Wt 262.1 lb

## 2018-04-24 DIAGNOSIS — E119 Type 2 diabetes mellitus without complications: Secondary | ICD-10-CM | POA: Diagnosis not present

## 2018-04-24 DIAGNOSIS — I10 Essential (primary) hypertension: Secondary | ICD-10-CM

## 2018-04-24 DIAGNOSIS — E785 Hyperlipidemia, unspecified: Secondary | ICD-10-CM | POA: Diagnosis not present

## 2018-04-24 LAB — HEPATIC FUNCTION PANEL
ALK PHOS: 120 U/L — AB (ref 39–117)
ALT: 35 U/L (ref 0–35)
AST: 21 U/L (ref 0–37)
Albumin: 4.3 g/dL (ref 3.5–5.2)
BILIRUBIN DIRECT: 0.2 mg/dL (ref 0.0–0.3)
BILIRUBIN TOTAL: 1.4 mg/dL — AB (ref 0.2–1.2)
Total Protein: 7.1 g/dL (ref 6.0–8.3)

## 2018-04-24 LAB — LIPID PANEL
CHOLESTEROL: 154 mg/dL (ref 0–200)
HDL: 45 mg/dL (ref >39.00)
LDL Cholesterol: 77 mg/dL (ref 0–99)
NONHDL: 108.79
Total CHOL/HDL Ratio: 3
Triglycerides: 159 mg/dL — ABNORMAL HIGH (ref 0.0–149.0)
VLDL: 31.8 mg/dL (ref 0.0–40.0)

## 2018-04-24 LAB — BASIC METABOLIC PANEL
BUN: 14 mg/dL (ref 6–23)
CALCIUM: 9.9 mg/dL (ref 8.4–10.5)
CO2: 30 meq/L (ref 19–32)
CREATININE: 0.69 mg/dL (ref 0.40–1.20)
Chloride: 100 mEq/L (ref 96–112)
GFR: 89.25 mL/min (ref >60.00)
GLUCOSE: 220 mg/dL — AB (ref 70–99)
Potassium: 4.6 mEq/L (ref 3.5–5.1)
SODIUM: 137 meq/L (ref 135–145)

## 2018-04-24 LAB — HEMOGLOBIN A1C: Hgb A1c MFr Bld: 7.8 % — ABNORMAL HIGH (ref 4.6–6.5)

## 2018-04-24 NOTE — Progress Notes (Signed)
  Subjective:     Patient ID: Joanne Mendoza, female   DOB: 1946/05/02, 72 y.o.   MRN: [id number]  HPI Patient seen for medical follow-up. She has history of obesity, hypertension, type 2 diabetes, hyperlipidemia. Medications reviewed and compliant with all. She recently started swimming some for exercise. Denies any chest pains or dizziness. She's not had eye exam in well over a year. She also has not had colonoscopy. She declines setting up at this time.  Past Medical History:  Diagnosis Date  . Hyperlipidemia   . Hypertension    Past Surgical History:  Procedure Laterality Date  . broken arm    . CESAREAN SECTION     x 2     reports that she has never smoked. She has never used smokeless tobacco. She reports that she does not drink alcohol or use drugs. family history includes Alcohol abuse in her father; Colon cancer in her paternal uncle; Hyperlipidemia in her unknown relative; Hypertension in her mother; Pancreatic cancer in her paternal uncle. Allergies  Allergen Reactions  . Penicillins     REACTION: hives     Review of Systems  Constitutional: Negative for fatigue and unexpected weight change.  Eyes: Negative for visual disturbance.  Respiratory: Negative for cough, chest tightness, shortness of breath and wheezing.   Cardiovascular: Negative for chest pain, palpitations and leg swelling.  Endocrine: Negative for polydipsia and polyuria.  Neurological: Negative for dizziness, seizures, syncope, weakness, light-headedness and headaches.       Objective:   Physical Exam  Constitutional: She appears well-developed and well-nourished.  Eyes: Pupils are equal, round, and reactive to light.  Neck: Neck supple. No JVD present. No thyromegaly present.  Cardiovascular: Normal rate and regular rhythm. Exam reveals no gallop.  Pulmonary/Chest: Effort normal and breath sounds normal. No respiratory distress. She has no wheezes. She has no rales.  Musculoskeletal: She exhibits  no edema.  Neurological: She is alert.       Assessment:     #1 hypertension stable and at goal  #2 type 2 diabetes. Last A1c 6.8%. Needs eye exam  #3 dyslipidemia    Plan:     -obtain fasting labs today with A1c, lipid panel, hepatic panel, basic metabolic panel -Continue regular exercise habits -Encouraged to set up diabetic eye exam -She is encouraged to lose some weight -Flu vaccine later this fall -Routine follow-up in 6 months and sooner as needed  Kristian CoveyBruce W Burchette MD [facility]

## 2018-04-24 NOTE — Patient Instructions (Signed)
Set up eye exam this year  Consider mammogram and colonoscopy this year.

## 2018-05-22 ENCOUNTER — Other Ambulatory Visit: Payer: Self-pay | Admitting: Family Medicine

## 2018-06-11 ENCOUNTER — Other Ambulatory Visit: Payer: Self-pay | Admitting: Family Medicine

## 2018-06-18 ENCOUNTER — Ambulatory Visit (INDEPENDENT_AMBULATORY_CARE_PROVIDER_SITE_OTHER): Payer: Medicare Other | Admitting: *Deleted

## 2018-06-18 DIAGNOSIS — Z23 Encounter for immunization: Secondary | ICD-10-CM

## 2018-06-19 ENCOUNTER — Other Ambulatory Visit: Payer: Self-pay | Admitting: Family Medicine

## 2018-06-19 MED ORDER — METFORMIN HCL 500 MG PO TABS: ORAL_TABLET | ORAL | 1 refills | Status: DC

## 2018-06-19 NOTE — Telephone Encounter (Signed)
Copied from CRM [id number]. Topic: Quick Communication - Rx Refill/Question >> Jun 19, 2018 11:07 AM Angela Nevin wrote: Medication: metFORMIN (GLUCOPHAGE) 500 MG tablet  Pt is requesting refill of this medication-stating she has been taking double the dosage per Dr. Caryl Never.   Preferred Pharmacy (with phone number or street name): [id number] - [place], [place] - [address]  [phone] (Phone) [phone] (Fax)

## 2018-06-19 NOTE — Telephone Encounter (Signed)
Requested medication (s) are due for refill today: no  Requested medication (s) are on the active medication list: yes  Last refill:  01/07/18  Future visit scheduled: yes  Notes to clinic:  Joanne Mendoza states she is taking double the dosage per Dr. Elease Hashimoto.   Requested Prescriptions  Pending Prescriptions Disp Refills   metFORMIN (GLUCOPHAGE) 500 MG tablet 180 tablet 1     Endocrinology:  Diabetes - Biguanides Passed - 06/19/2018 11:22 AM      Passed - Cr in normal range and within 360 days    Creatinine, Ser  Date Value Ref Range Status  04/24/2018 0.69 0.40 - 1.20 mg/dL Final         Passed - HBA1C is between 0 and 7.9 and within 180 days    Hgb A1c MFr Bld  Date Value Ref Range Status  04/24/2018 7.8 (H) 4.6 - 6.5 % Final    Comment:    Glycemic Control Guidelines for People with Diabetes:Non Diabetic:  <6%Goal of Therapy: <7%Additional Action Suggested:  >8%          Passed - eGFR in normal range and within 360 days    GFR calc Af Amer  Date Value Ref Range Status  06/05/2015 >60 >60 mL/min Final    Comment:    (NOTE) The eGFR has been calculated using the CKD EPI equation. This calculation has not been validated in all clinical situations. eGFR's persistently <60 mL/min signify possible Chronic Kidney Disease.    GFR calc non Af Amer  Date Value Ref Range Status  06/05/2015 >60 >60 mL/min Final   GFR  Date Value Ref Range Status  04/24/2018 89.25 >60.00 mL/min Final         Passed - Valid encounter within last 6 months    Recent Outpatient Visits          1 month ago Essential hypertension   [facility], Alinda Sierras, MD   8 months ago Controlled type 2 diabetes mellitus without complication, without long-term current use of insulin ([place])   [facility], Alinda Sierras, MD   1 year ago Essential hypertension   [facility] at [facility], Alinda Sierras, MD   1 year ago Controlled type 2 diabetes  mellitus without complication, without long-term current use of insulin ([place])   [facility] at [facility], Alinda Sierras, MD   2 years ago Controlled type 2 diabetes mellitus without complication, without long-term current use of insulin ([place])   [facility]rap[facility] at [facility], Alinda Sierras, MD      Future Appointments            In 1 month Burchette, Alinda Sierras, MD [place] [facility] [place], [place]   In 4 months Burchette, Alinda Sierras, MD [facility] [facility] at [place], [facility]

## 2018-07-15 ENCOUNTER — Other Ambulatory Visit: Payer: Self-pay | Admitting: Family Medicine

## 2018-07-28 ENCOUNTER — Telehealth: Payer: Self-pay

## 2018-07-28 MED ORDER — METFORMIN HCL 500 MG PO TABS: ORAL_TABLET | ORAL | 1 refills | Status: DC

## 2018-07-28 NOTE — Telephone Encounter (Signed)
Patient should be taking Metformin 500 2 tabs twice daily.  New Rx sent.  Patient is aware.

## 2018-07-28 NOTE — Telephone Encounter (Signed)
Copied from CRM [id number]. Topic: Quick Communication - Rx Refill/Question >> Jul 28, 2018  3:07 PM Percival SpanishKennedy, Cheryl W wrote: Medication   metFORMIN (GLUCOPHAGE) 500 MG tablet     pt said she is suppose to take 2 tablet 2 times a day but when the last rx was filled in Oct it was 1 tablet 2 times a day and she is need to verify which is correct before the RX is called in    Preferred Pharmacy  [facility] [place]   Agent: Please be advised that RX refills may take up to 3 business days. We ask that you follow-up with your pharmacy.

## 2018-07-29 ENCOUNTER — Ambulatory Visit: Payer: Medicare Other | Admitting: Family Medicine

## 2018-08-18 ENCOUNTER — Ambulatory Visit: Payer: Medicare Other | Admitting: Family Medicine

## 2018-09-11 ENCOUNTER — Other Ambulatory Visit: Payer: Self-pay | Admitting: Family Medicine

## 2018-09-18 ENCOUNTER — Ambulatory Visit: Payer: Medicare Other | Admitting: Family Medicine

## 2018-09-30 ENCOUNTER — Ambulatory Visit: Payer: Medicare Other | Admitting: Family Medicine

## 2018-10-02 ENCOUNTER — Ambulatory Visit: Payer: Medicare Other | Admitting: Family Medicine

## 2018-10-02 ENCOUNTER — Other Ambulatory Visit: Payer: Self-pay

## 2018-10-02 ENCOUNTER — Encounter: Payer: Self-pay | Admitting: Family Medicine

## 2018-10-02 VITALS — BP 144/76 | HR 100 | Temp 97.9°F | Wt 240.9 lb

## 2018-10-02 DIAGNOSIS — J069 Acute upper respiratory infection, unspecified: Secondary | ICD-10-CM

## 2018-10-02 DIAGNOSIS — E785 Hyperlipidemia, unspecified: Secondary | ICD-10-CM

## 2018-10-02 DIAGNOSIS — I10 Essential (primary) hypertension: Secondary | ICD-10-CM | POA: Diagnosis not present

## 2018-10-02 DIAGNOSIS — B9789 Other viral agents as the cause of diseases classified elsewhere: Secondary | ICD-10-CM

## 2018-10-02 DIAGNOSIS — E1169 Type 2 diabetes mellitus with other specified complication: Secondary | ICD-10-CM | POA: Insufficient documentation

## 2018-10-02 DIAGNOSIS — E1165 Type 2 diabetes mellitus with hyperglycemia: Secondary | ICD-10-CM | POA: Diagnosis not present

## 2018-10-02 LAB — POCT GLYCOSYLATED HEMOGLOBIN (HGB A1C): Hemoglobin A1C: 6.5 % — AB (ref 4.0–5.6)

## 2018-10-02 NOTE — Progress Notes (Signed)
Subjective:     Patient ID: Joanne Mendoza, female   DOB: 1946/06/27, 73 y.o.   MRN: [id number]  HPI Patient seen for medical follow-up.  Chronic problems include type 2 diabetes, hyperlipidemia, hypertension.  Joanne Mendoza has done a tremendous job with weight loss since last fall.  Joanne Mendoza has lost about 22 pounds.  Joanne Mendoza has done this largely through restricting portion sizes and scaling back on sugars.  Type 2 diabetes.  Last A1c 7.8%.  We increased her metformin to 2 500 mg tablets twice daily.  Joanne Mendoza has unfortunately had some GI issues with some occasional cramps and occasional loose stools.  Joanne Mendoza would like to go back to 1 twice daily.  Hypertension treated with metoprolol, amlodipine, losartan HCTZ.  No headaches or dizziness.  No chest pains.  Joanne Mendoza has history of "white coat syndrome ".  Blood pressures consistently better at home.  Hyperlipidemia treated with Lipitor.  No recent myalgias.  Joanne Mendoza had recent upper respiratory infection.  Joanne Mendoza was [place] and thinks Joanne Mendoza picked up some of the way back.  Joanne Mendoza has had some mostly dry cough.  Nasal congestive symptoms are improving.  Symptoms started couple weeks ago.  No fever  Past Medical History:  Diagnosis Date  . Hyperlipidemia   . Hypertension    Past Surgical History:  Procedure Laterality Date  . broken arm    . CESAREAN SECTION     x 2     reports that Joanne Mendoza has never smoked. Joanne Mendoza has never used smokeless tobacco. Joanne Mendoza reports that Joanne Mendoza does not drink alcohol or use drugs. family history includes Alcohol abuse in her father; Colon cancer in her paternal uncle; Hyperlipidemia in an other family member; Hypertension in her mother; Pancreatic cancer in her paternal uncle. Allergies  Allergen Reactions  . Penicillins     REACTION: hives     Review of Systems  Constitutional: Negative for chills, fatigue and fever.  HENT: Negative for sore throat.   Eyes: Negative for visual disturbance.  Respiratory:  Positive for cough. Negative for chest tightness, shortness of breath and wheezing.   Cardiovascular: Negative for chest pain, palpitations and leg swelling.  Neurological: Negative for dizziness, seizures, syncope, weakness, light-headedness and headaches.       Objective:   Physical Exam Constitutional:      Appearance: Joanne Mendoza is well-developed.  Eyes:     Pupils: Pupils are equal, round, and reactive to light.  Neck:     Musculoskeletal: Neck supple.     Thyroid: No thyromegaly.     Vascular: No JVD.  Cardiovascular:     Rate and Rhythm: Normal rate and regular rhythm.     Heart sounds: No gallop.   Pulmonary:     Effort: Pulmonary effort is normal. No respiratory distress.     Breath sounds: Normal breath sounds. No wheezing or rales.  Neurological:     Mental Status: Joanne Mendoza is alert.        Assessment:     #1 type 2 diabetes improved with A1c 6.5%.  Joanne Mendoza has unfortunately had some increased loose stools though with metformin higher dose  #2 hypertension.  Probable whitecoat syndrome.  Controlled at home  #3 hyperlipidemia  #4 viral URI with cough    Plan:     -We will reduce her metformin back to 1 twice daily.  We did discuss other potential add-on options including SGL T2 or GLP-1 medication at this point Joanne Mendoza would like to give  3 more month trial of weight loss and reassess -We recommended observation regarding her cough.  Suspect this will be resolving over the next few weeks -We will plan 2974-month schedule follow-up.  If A1c climbing back up at that point consider either change to extended release metformin and or addition of GLP-1 medication  Kristian CoveyBruce W Brionna Romanek MD [facility]

## 2018-10-02 NOTE — Addendum Note (Signed)
Addended by: Ronita HippsFAGGE, Jaimi Belle L on: 10/02/2018 02:32 PM   Modules accepted: Orders

## 2018-10-02 NOTE — Patient Instructions (Signed)
Keep up the weight loss  Let's reduce the Metformin back to one twice daily  Let's plan 3 month follow up.

## 2018-10-15 ENCOUNTER — Other Ambulatory Visit: Payer: Self-pay | Admitting: Family Medicine

## 2018-10-28 ENCOUNTER — Ambulatory Visit: Payer: Medicare Other | Admitting: Family Medicine

## 2018-11-16 ENCOUNTER — Other Ambulatory Visit: Payer: Self-pay | Admitting: Family Medicine

## 2018-11-17 ENCOUNTER — Other Ambulatory Visit: Payer: Self-pay | Admitting: Family Medicine

## 2018-12-29 ENCOUNTER — Ambulatory Visit: Payer: Medicare Other | Admitting: Family Medicine

## 2019-01-12 ENCOUNTER — Other Ambulatory Visit: Payer: Self-pay | Admitting: Family Medicine

## 2019-01-25 ENCOUNTER — Other Ambulatory Visit: Payer: Self-pay | Admitting: Family Medicine

## 2019-02-12 ENCOUNTER — Other Ambulatory Visit: Payer: Self-pay | Admitting: Family Medicine

## 2019-02-17 ENCOUNTER — Ambulatory Visit: Payer: Medicare Other | Admitting: Family Medicine

## 2019-03-24 ENCOUNTER — Ambulatory Visit: Payer: Medicare Other | Admitting: Family Medicine

## 2019-04-02 ENCOUNTER — Encounter: Payer: Self-pay | Admitting: Family Medicine

## 2019-04-02 ENCOUNTER — Other Ambulatory Visit: Payer: Self-pay

## 2019-04-02 ENCOUNTER — Ambulatory Visit (INDEPENDENT_AMBULATORY_CARE_PROVIDER_SITE_OTHER): Payer: Medicare Other | Admitting: Family Medicine

## 2019-04-02 VITALS — BP 130/82 | HR 98 | Temp 97.9°F | Ht 65.0 in | Wt 250.1 lb

## 2019-04-02 DIAGNOSIS — I10 Essential (primary) hypertension: Secondary | ICD-10-CM

## 2019-04-02 DIAGNOSIS — E1165 Type 2 diabetes mellitus with hyperglycemia: Secondary | ICD-10-CM

## 2019-04-02 DIAGNOSIS — E785 Hyperlipidemia, unspecified: Secondary | ICD-10-CM | POA: Diagnosis not present

## 2019-04-02 DIAGNOSIS — E1169 Type 2 diabetes mellitus with other specified complication: Secondary | ICD-10-CM

## 2019-04-02 LAB — POCT GLYCOSYLATED HEMOGLOBIN (HGB A1C): Hemoglobin A1C: 7.2 % — AB (ref 4.0–5.6)

## 2019-04-02 NOTE — Patient Instructions (Signed)
Type 2 Diabetes Mellitus, Self Care, Adult °Caring for yourself after you have been diagnosed with type 2 diabetes (type 2 diabetes mellitus) means keeping your blood sugar (glucose) under control with a balance of: °· Nutrition. °· Exercise. °· Lifestyle changes. °· Medicines or insulin, if necessary. °· Support from your team of health care providers and others. °The following information explains what you need to know to manage your diabetes at home. °What are the risks? °Having diabetes can put you at risk for other long-term (chronic) conditions, such as heart disease and kidney disease. Your health care provider may prescribe medicines to help prevent complications from diabetes. These medicines may include: °· Aspirin. °· Medicine to lower cholesterol. °· Medicine to control blood pressure. °How to monitor blood glucose ° °· Check your blood glucose every day, as often as told by your health care provider. °· Have your A1c (hemoglobin A1c) level checked two or more times a year, or as often as told by your health care provider. °Your health care provider will set individualized treatment goals for you. Generally, the goal of treatment is to maintain the following blood glucose levels: °· Before meals (preprandial): 80-130 mg/dL (4.4-7.2 mmol/L). °· After meals (postprandial): below 180 mg/dL (10 mmol/L). °· A1c level: less than 7%. °How to manage hyperglycemia and hypoglycemia °Hyperglycemia symptoms °Hyperglycemia, also called high blood glucose, occurs when blood glucose is too high. Make sure you know the early signs of hyperglycemia, such as: °· Increased thirst. °· Hunger. °· Feeling very tired. °· Needing to urinate more often than usual. °· Blurry vision. °Hypoglycemia symptoms °Hypoglycemia, also called low blood glucose, occurs with a blood glucose level at or below 70 mg/dL (3.9 mmol/L). The risk for hypoglycemia increases during or after exercise, during sleep, during illness, and when skipping  meals or not eating for a long time (fasting). °It is important to know the symptoms of hypoglycemia and treat it right away. Always have a 15-gram rapid-acting carbohydrate snack with you to treat low blood glucose. Family members and close friends should also know the symptoms and should understand how to treat hypoglycemia, in case you are not able to treat yourself. Symptoms may include: °· Hunger. °· Anxiety. °· Sweating and feeling clammy. °· Confusion. °· Dizziness or feeling light-headed. °· Sleepiness. °· Nausea. °· Increased heart rate. °· Headache. °· Blurry vision. °· Irritability. °· A change in coordination. °· Tingling or numbness around the mouth, lips, or tongue. °· Restless sleep. °· Fainting. °· Seizure. °Treating hypoglycemia °If you are alert and able to swallow safely, follow the 15:15 rule: °· Take 15 grams of a rapid-acting carbohydrate. Talk with your health care provider about how much you should take. °· Rapid-acting options include: °? Glucose pills (take 15 grams). °? 6-8 pieces of hard candy. °? 4-6 oz (120-150 mL) of fruit juice. °? 4-6 oz (120-150 mL) of regular (not diet) soda. °? 1 Tbsp (15 mL) honey or sugar. °· Check your blood glucose 15 minutes after you take the carbohydrate. °· If the repeat blood glucose level is still at or below 70 mg/dL (3.9 mmol/L), take 15 grams of a carbohydrate again. °· If your blood glucose level does not increase above 70 mg/dL (3.9 mmol/L) after 3 tries, seek emergency medical care. °· After your blood glucose level returns to normal, eat a meal or a snack within 1 hour. °Treating severe hypoglycemia °Severe hypoglycemia is when your blood glucose level is at or below 54 mg/dL (3 mmol/L). Severe hypoglycemia   is an emergency. Do not wait to see if the symptoms will go away. Get medical help right away. Call your local emergency services (911 in the [place] °If you have severe hypoglycemia and you cannot eat or drink, you may need an injection of  glucagon. A family member or close friend should learn how to check your blood glucose and how to give you a glucagon injection. Ask your health care provider if you need to have an emergency glucagon injection kit available. °Severe hypoglycemia may need to be treated in a hospital. The treatment may include getting glucose through an IV. You may also need treatment for the cause of your hypoglycemia. °Follow these instructions at home: °Take diabetes medicines as told °· If your health care provider prescribed insulin or diabetes medicines, take them every day. °· Do not run out of insulin or other diabetes medicines that you take. Plan ahead so you always have these available. °· If you use insulin, adjust your dosage based on how physically active you are and what foods you eat. Your health care provider will tell you how to adjust your dosage. °Make healthy food choices ° °The things that you eat and drink affect your blood glucose and your insulin dosage. Making good choices helps to control your diabetes and prevent other health problems. A healthy meal plan includes eating lean proteins, complex carbohydrates, fresh fruits and vegetables, low-fat dairy products, and healthy fats. °Make an appointment to see a diet and nutrition specialist (registered dietitian) to help you create an eating plan that is right for you. Make sure that you: °· Follow instructions from your health care provider about eating or drinking restrictions. °· Drink enough fluid to keep your urine pale yellow. °· Keep a record of the carbohydrates that you eat. Do this by reading food labels and learning the standard serving sizes of foods. °· Follow your sick day plan whenever you cannot eat or drink as usual. Make this plan in advance with your health care provider. ° °Stay active °Exercise regularly, as told by your health care provider. This may include: °· Stretching and doing strength exercises, such as yoga or weightlifting, 2 or  more times a week. °· Doing 150 minutes or more of moderate-intensity or vigorous-intensity exercise each week. This could be brisk walking, biking, or water aerobics. °? Spread out your activity over 3 or more days of the week. °? Do not go more than 2 days in a row without doing some kind of physical activity. °When you start a new exercise or activity, work with your health care provider to adjust your insulin, medicines, or food intake as needed. °Make healthy lifestyle choices °· Do not use any tobacco products, such as cigarettes, chewing tobacco, and e-cigarettes. If you need help quitting, ask your health care provider. °· If your health care provider says that alcohol is safe for you, limit alcohol intake to no more than 1 drink per day for nonpregnant women and 2 drinks per day for men. One drink equals 12 oz of beer (355 mL), 5 oz of wine (148 mL), or 1½ oz of hard liquor (44 mL). °· Learn to manage stress. If you need help with this, ask your health care provider. °Care for your body ° °· Keep your immunizations up to date. In addition to getting vaccinations as told by your health care provider, it is recommended that you get vaccinated against the following illnesses: °? The flu (influenza). Get a flu shot   every year. °? Pneumonia. °? Hepatitis B. °· Schedule an eye exam soon after your diagnosis, and then one time every year after that. °· Check your skin and feet every day for cuts, bruises, redness, blisters, or sores. Schedule a foot exam with your health care provider once every year. °· Brush your teeth and gums two times a day, and floss one or more times a day. Visit your dentist one or more times every 6 months. °· Maintain a healthy weight. °General instructions °· Take over-the-counter and prescription medicines only as told by your health care provider. °· Share your diabetes management plan with people in your workplace, school, and household. °· Carry a medical alert card or wear medical  alert jewelry. °· Keep all follow-up visits as told by your health care provider. This is important. °Questions to ask your health care provider °· Do I need to meet with a diabetes educator? °· Where can I find a support group for people with diabetes? °Where to find more information °For more information about diabetes, visit: °· American Diabetes Association (ADA): www.diabetes.org °· American Association of Diabetes Educators (AADE): www.diabeteseducator.org °Summary °· Caring for yourself after you have been diagnosed with (type 2 diabetes mellitus) means keeping your blood sugar (glucose) under control with a balance of nutrition, exercise, lifestyle changes, and medicine. °· Check your blood glucose every day, as often as told by your health care provider. °· Having diabetes can put you at risk for other long-term (chronic) conditions, such as heart disease and kidney disease. Your health care provider may prescribe medicines to help prevent complications from diabetes. °· Keep all follow-up visits as told by your health care provider. This is important. °This information is not intended to replace advice given to you by your health care provider. Make sure you discuss any questions you have with your health care provider. °Document Released: 12/11/2015 Document Revised: 02/09/2018 Document Reviewed: 09/22/2015 °Elsevier Patient Education © 2020 [employer] ° °

## 2019-04-02 NOTE — Progress Notes (Signed)
  Subjective:     Patient ID: Joanne Mendoza, female   DOB: 05-Dec-1945, 73 y.o.   MRN: [id number]  HPI   Joanne Mendoza is here for medical follow-up.  She has history of obesity, type 2 diabetes, hypertension, hyperlipidemia.  She is not monitoring her blood sugars regularly.  Her last A1c was 6.5%.  She did drop back her metformin to 500 mg twice daily.  She had increased GI side effects with higher dosage.  She has done better with lower dose.  No polyuria or polydipsia.  Her exercise has been curtailed because of current gym restriction.  She is thinking about resuming swimming.    She remains on Lipitor.  She is due for follow-up lipids soon but is nonfasting today.  Her blood pressures been stable.  Compliant with medications.  Past Medical History:  Diagnosis Date  . Hyperlipidemia   . Hypertension    Past Surgical History:  Procedure Laterality Date  . broken arm    . CESAREAN SECTION     x 2     reports that she has never smoked. She has never used smokeless tobacco. She reports that she does not drink alcohol or use drugs. family history includes Alcohol abuse in her father; Colon cancer in her paternal uncle; Hyperlipidemia in an other family member; Hypertension in her mother; Pancreatic cancer in her paternal uncle. Allergies  Allergen Reactions  . Penicillins     REACTION: hives     Review of Systems  Constitutional: Negative for chills, fatigue, fever and unexpected weight change.  Eyes: Negative for visual disturbance.  Respiratory: Negative for cough, chest tightness, shortness of breath and wheezing.   Cardiovascular: Negative for chest pain, palpitations and leg swelling.  Endocrine: Negative for polydipsia and polyuria.  Neurological: Negative for dizziness, seizures, syncope, weakness, light-headedness and headaches.       Objective:   Physical Exam Constitutional:      Appearance: She is well-developed.  Eyes:     Pupils: Pupils are equal, round, and reactive  to light.  Neck:     Musculoskeletal: Neck supple.     Thyroid: No thyromegaly.     Vascular: No JVD.  Cardiovascular:     Rate and Rhythm: Normal rate and regular rhythm.     Heart sounds: No gallop.   Pulmonary:     Effort: Pulmonary effort is normal. No respiratory distress.     Breath sounds: Normal breath sounds. No wheezing or rales.  Neurological:     Mental Status: She is alert.        Assessment:     #1 type 2 diabetes.  Fair control.  A1c today 7.2%  #2 hypertension stable  #3 dyslipidemia    Plan:     -We discussed options regarding diabetes.  We have have recommended 3 months of efforts at weight loss and lifestyle modification.  If not further to goal at that point consider SGLT-2 or GLP-1 medication -We will plan to get fasting lipids at follow-up along with other chemistries and repeat A1c  -Needs to set up repeat eye exam sometime this year  Eulas Post MD [facility] at [facility]

## 2019-04-11 ENCOUNTER — Other Ambulatory Visit: Payer: Self-pay | Admitting: Family Medicine

## 2019-05-15 ENCOUNTER — Other Ambulatory Visit: Payer: Self-pay | Admitting: Family Medicine

## 2019-07-10 ENCOUNTER — Other Ambulatory Visit: Payer: Self-pay | Admitting: Family Medicine

## 2019-07-25 ENCOUNTER — Other Ambulatory Visit: Payer: Self-pay | Admitting: Family Medicine

## 2019-08-14 ENCOUNTER — Other Ambulatory Visit: Payer: Self-pay | Admitting: Family Medicine

## 2019-08-16 ENCOUNTER — Other Ambulatory Visit: Payer: Self-pay | Admitting: Family Medicine

## 2019-08-20 ENCOUNTER — Encounter: Payer: Self-pay | Admitting: Family Medicine

## 2019-08-20 ENCOUNTER — Ambulatory Visit: Payer: Medicare Other | Admitting: Family Medicine

## 2019-08-20 ENCOUNTER — Other Ambulatory Visit: Payer: Self-pay

## 2019-08-20 ENCOUNTER — Telehealth: Payer: Self-pay

## 2019-08-20 VITALS — BP 140/74 | HR 112 | Temp 97.9°F | Wt 248.9 lb

## 2019-08-20 DIAGNOSIS — I10 Essential (primary) hypertension: Secondary | ICD-10-CM | POA: Diagnosis not present

## 2019-08-20 DIAGNOSIS — E785 Hyperlipidemia, unspecified: Secondary | ICD-10-CM

## 2019-08-20 DIAGNOSIS — E1165 Type 2 diabetes mellitus with hyperglycemia: Secondary | ICD-10-CM

## 2019-08-20 DIAGNOSIS — E1169 Type 2 diabetes mellitus with other specified complication: Secondary | ICD-10-CM

## 2019-08-20 LAB — LIPID PANEL
Cholesterol: 151 mg/dL (ref 0–200)
HDL: 43.3 mg/dL (ref >39.00)
LDL Cholesterol: 77 mg/dL (ref 0–99)
NonHDL: 107.36
Total CHOL/HDL Ratio: 3
Triglycerides: 153 mg/dL — ABNORMAL HIGH (ref 0.0–149.0)
VLDL: 30.6 mg/dL (ref 0.0–40.0)

## 2019-08-20 LAB — BASIC METABOLIC PANEL
BUN: 15 mg/dL (ref 6–23)
CO2: 26 mEq/L (ref 19–32)
Calcium: 10 mg/dL (ref 8.4–10.5)
Chloride: 101 mEq/L (ref 96–112)
Creatinine, Ser: 0.72 mg/dL (ref 0.40–1.20)
GFR: 79.65 mL/min (ref >60.00)
Glucose, Bld: 154 mg/dL — ABNORMAL HIGH (ref 70–99)
Potassium: 4.1 mEq/L (ref 3.5–5.1)
Sodium: 136 mEq/L (ref 135–145)

## 2019-08-20 LAB — HEPATIC FUNCTION PANEL
ALT: 26 U/L (ref 0–35)
AST: 20 U/L (ref 0–37)
Albumin: 4.4 g/dL (ref 3.5–5.2)
Alkaline Phosphatase: 96 U/L (ref 39–117)
Bilirubin, Direct: 0.3 mg/dL (ref 0.0–0.3)
Total Bilirubin: 1.7 mg/dL — ABNORMAL HIGH (ref 0.2–1.2)
Total Protein: 7.2 g/dL (ref 6.0–8.3)

## 2019-08-20 LAB — HEMOGLOBIN A1C: Hgb A1c MFr Bld: 7.2 % — ABNORMAL HIGH (ref 4.6–6.5)

## 2019-08-20 NOTE — Telephone Encounter (Signed)
Copied from [id number]. Topic: General - Other >> Aug 20, 2019  3:14 PM Rayann Heman wrote: Reason for CRM: pt called and stated that she has some info for Dr Elease Hashimoto and would like a call back. Please advise

## 2019-08-20 NOTE — Patient Instructions (Signed)
Set up diabetic eye exam   

## 2019-08-20 NOTE — Telephone Encounter (Signed)
Spoke to pt and stated that she had some non health related information for Dr.Burchette. Pt stated that he needed a number for a company they were talking about but she will call back shortly with the info.

## 2019-08-20 NOTE — Progress Notes (Signed)
  Subjective:     Patient ID: Joanne Mendoza, female   DOB: May 15, 1946, 73 y.o.   MRN: [id number]  HPI   Joanne Mendoza is seen for medical follow-up.  She has history of obesity, hypertension, type 2 diabetes, hyperlipidemia.  She has not been exercising much regularly.  She has been hesitant to go to the [facility].  She has not been checking her blood sugars regularly.  Her medications include amlodipine, atorvastatin, losartan HCTZ, Metformin, and metoprolol. She has been staying very isolated for the most part.  She is overdue for eye exam and knows that she needs to set that up.  Her only diabetic medicine currently is Metformin 500 mg once daily.  Denies any neuropathy symptoms.  Compliant with all medications and denies any side effects.  She states she has history of "white coat syndrome ".  Not recently monitoring blood pressures at home.  Past Medical History:  Diagnosis Date  . Hyperlipidemia   . Hypertension    Past Surgical History:  Procedure Laterality Date  . broken arm    . CESAREAN SECTION     x 2     reports that she has never smoked. She has never used smokeless tobacco. She reports that she does not drink alcohol or use drugs. family history includes Alcohol abuse in her father; Colon cancer in her paternal uncle; Hyperlipidemia in an other family member; Hypertension in her mother; Pancreatic cancer in her paternal uncle. Allergies  Allergen Reactions  . Penicillins     REACTION: hives     Review of Systems  Constitutional: Negative for chills, fatigue and fever.  Eyes: Negative for visual disturbance.  Respiratory: Negative for cough, chest tightness, shortness of breath and wheezing.   Cardiovascular: Negative for chest pain, palpitations and leg swelling.  Endocrine: Negative for polydipsia and polyuria.  Genitourinary: Negative for dysuria.  Neurological: Negative for dizziness, seizures, syncope, weakness, light-headedness and headaches.       Objective:   Physical Exam Constitutional:      Appearance: She is well-developed.  Eyes:     Pupils: Pupils are equal, round, and reactive to light.  Neck:     Thyroid: No thyromegaly.     Vascular: No JVD.  Cardiovascular:     Rate and Rhythm: Normal rate and regular rhythm.     Heart sounds: No gallop.   Pulmonary:     Effort: Pulmonary effort is normal. No respiratory distress.     Breath sounds: Normal breath sounds. No wheezing or rales.  Musculoskeletal:     Cervical back: Neck supple.     Right lower leg: No edema.     Left lower leg: No edema.  Skin:    Comments: Feet reveal no skin lesions. Good distal foot pulses. Good capillary refill. No calluses. Normal sensation with monofilament testing   Neurological:     Mental Status: She is alert.        Assessment:     #1 type 2 diabetes.  History of fair control.  #2 hypertension.  Possible whitecoat syndrome  #3 hyperlipidemia currently treated with Lipitor    Plan:     -Recheck labs including lipids, hepatic, basic metabolic panel, R1V -Strongly encouraged to set up eye exam -She is encouraged to lose some weight -We will plan routine follow-up in 6 months unless dictated otherwise by labs above  Eulas Post MD [facility] at [facility]

## 2019-08-24 ENCOUNTER — Telehealth: Payer: Self-pay

## 2019-08-24 NOTE — Telephone Encounter (Signed)
Called patient earlier and she asked if I could call her back so she could get the information from her phone.

## 2019-08-24 NOTE — Telephone Encounter (Signed)
Copied from [place] [phone]. Topic: General - Call Back - No Documentation >> Aug 24, 2019  3:53 PM Erick Blinks wrote: Reason for CRM: Message for [facility].  Faye Ramsay [phone] - he is the [employer]  [employer] in [place] "This person does all the work in [place], a little rough around the edges but does great work"

## 2019-08-24 NOTE — Telephone Encounter (Signed)
Called patient and LMOVM to return call  [place] for [facility] to Discuss results / PCP / recommendations / Schedule patient  I left a detailed VM to let patient know that she can call back and leave the company name and number and message and we can send to Dr. Elease Hashimoto.  CRM Created.

## 2019-09-13 ENCOUNTER — Other Ambulatory Visit: Payer: Self-pay | Admitting: Family Medicine

## 2019-09-28 ENCOUNTER — Ambulatory Visit: Payer: Medicare Other

## 2019-09-30 ENCOUNTER — Telehealth: Payer: Self-pay | Admitting: Family Medicine

## 2019-09-30 NOTE — Telephone Encounter (Signed)
Pt is scheduled to get the COVID vaccine tomorrow 09/30/19. Pt has questions: Does she need to take a list of medications with her? Make her allergies known to the people administrating it?  Pt can be contacted at [phone]

## 2019-10-01 NOTE — Telephone Encounter (Signed)
Called patient and LMOVM to return call  [place] for [facility] to Discuss results / PCP / recommendations / Schedule patient  Left a detailed voice message to let her know that she does not need to take a list to our knowledge but she can if she wants to.

## 2019-10-07 ENCOUNTER — Ambulatory Visit: Payer: Self-pay

## 2019-10-09 ENCOUNTER — Other Ambulatory Visit: Payer: Self-pay | Admitting: Family Medicine

## 2019-11-10 ENCOUNTER — Other Ambulatory Visit: Payer: Self-pay | Admitting: Family Medicine

## 2020-01-14 ENCOUNTER — Other Ambulatory Visit: Payer: Self-pay

## 2020-01-14 ENCOUNTER — Telehealth: Payer: Self-pay | Admitting: Family Medicine

## 2020-01-14 MED ORDER — METFORMIN HCL 500 MG PO TABS: 1000.0000 mg | ORAL_TABLET | Freq: Two times a day (BID) | ORAL | 1 refills | Status: DC

## 2020-01-14 NOTE — Telephone Encounter (Signed)
Pt calling to get refill on metformin sent to [facility] [place]. Pt is completely out of meds.

## 2020-01-14 NOTE — Telephone Encounter (Signed)
Medication sent in. 

## 2020-02-18 ENCOUNTER — Ambulatory Visit: Payer: Medicare Other | Admitting: Family Medicine

## 2020-03-15 ENCOUNTER — Ambulatory Visit: Payer: Self-pay | Admitting: Family Medicine

## 2020-04-04 ENCOUNTER — Other Ambulatory Visit: Payer: Self-pay | Admitting: Family Medicine

## 2020-04-06 ENCOUNTER — Telehealth: Payer: Self-pay | Admitting: Family Medicine

## 2020-04-06 NOTE — Telephone Encounter (Signed)
Left message for patient to schedule Annual Wellness Visit.  Please schedule with Nurse Health Advisor Shannon Crews, RN at [facility]  

## 2020-04-07 ENCOUNTER — Ambulatory Visit: Payer: Self-pay | Admitting: Family Medicine

## 2020-04-09 ENCOUNTER — Other Ambulatory Visit: Payer: Self-pay | Admitting: Family Medicine

## 2020-04-21 ENCOUNTER — Ambulatory Visit (INDEPENDENT_AMBULATORY_CARE_PROVIDER_SITE_OTHER): Payer: Medicare PPO | Admitting: Family Medicine

## 2020-04-21 ENCOUNTER — Other Ambulatory Visit: Payer: Self-pay

## 2020-04-21 VITALS — BP 144/82 | HR 91 | Temp 98.4°F | Wt 236.9 lb

## 2020-04-21 DIAGNOSIS — E1165 Type 2 diabetes mellitus with hyperglycemia: Secondary | ICD-10-CM | POA: Diagnosis not present

## 2020-04-21 DIAGNOSIS — E785 Hyperlipidemia, unspecified: Secondary | ICD-10-CM

## 2020-04-21 DIAGNOSIS — I10 Essential (primary) hypertension: Secondary | ICD-10-CM | POA: Diagnosis not present

## 2020-04-21 DIAGNOSIS — E1169 Type 2 diabetes mellitus with other specified complication: Secondary | ICD-10-CM

## 2020-04-21 LAB — POCT GLYCOSYLATED HEMOGLOBIN (HGB A1C): Hemoglobin A1C: 6.4 % — AB (ref 4.0–5.6)

## 2020-04-21 NOTE — Patient Instructions (Signed)
A1C is improved to 6.4%   Keep up the good work!

## 2020-04-21 NOTE — Progress Notes (Signed)
Established Patient Office Visit  Subjective:  Patient ID: Joanne Mendoza, female    DOB: 03-Dec-1945  Age: 74 y.o. MRN: [id number]  CC:  Chief Complaint  Patient presents with  . Follow-up    31month follow up     HPI Joanne Mendoza presents for medical follow-up.  She has hypertension, hyperlipidemia, type 2 diabetes.  Her last A1c was 7.2%.  We increased her Metformin 500 mg to 2 tablets twice daily.  Tolerating well with no side effects.  Her other medications include Lipitor, metoprolol, amlodipine, and losartan HCTZ.    She has been busy caring for her 76 year old mother.  She plans to travel to [place] to visit her son and grandchildren in a few weeks.  She has some reservations about being around others.  She has had her Covid vaccine.  She will be driving.  She has concern for whitecoat syndrome.  She states her home blood pressures are fairly consistently around 130/70  She is overdue for several health maintenance issues.  She has been reluctant to get colonoscopy in the past.  She needs follow-up mammogram.  She plans to set up Medicare wellness visit soon.  These items can be discussed further then.  Past Medical History:  Diagnosis Date  . Hyperlipidemia   . Hypertension     Past Surgical History:  Procedure Laterality Date  . broken arm    . CESAREAN SECTION     x 2     Family History  Problem Relation Age of Onset  . Alcohol abuse Father   . Hyperlipidemia Other        Grandparent   . Hypertension Mother   . Colon cancer Paternal Uncle   . Pancreatic cancer Paternal Uncle     Social History   Socioeconomic History  . Marital status: Divorced    Spouse name: Not on file  . Number of children: Not on file  . Years of education: Not on file  . Highest education level: Not on file  Occupational History  . Not on file  Tobacco Use  . Smoking status: Never Smoker  . Smokeless tobacco: Never Used  Vaping Use  . Vaping Use: Never used  Substance  and Sexual Activity  . Alcohol use: No  . Drug use: No  . Sexual activity: Not on file  Other Topics Concern  . Not on file  Social History Narrative  . Not on file   Social Determinants of Health   Financial Resource Strain:   . Difficulty of Paying Living Expenses: Not on file  Food Insecurity:   . Worried About Programme researcher, broadcasting/film/video in the Last Year: Not on file  . Ran Out of Food in the Last Year: Not on file  Transportation Needs:   . Lack of Transportation (Medical): Not on file  . Lack of Transportation (Non-Medical): Not on file  Physical Activity:   . Days of Exercise per Week: Not on file  . Minutes of Exercise per Session: Not on file  Stress:   . Feeling of Stress : Not on file  Social Connections:   . Frequency of Communication with Friends and Family: Not on file  . Frequency of Social Gatherings with Friends and Family: Not on file  . Attends Religious Services: Not on file  . Active Member of Clubs or Organizations: Not on file  . Attends Banker Meetings: Not on file  . Marital Status: Not on file  Intimate Partner Violence:   . Fear of Current or Ex-Partner: Not on file  . Emotionally Abused: Not on file  . Physically Abused: Not on file  . Sexually Abused: Not on file    Outpatient Medications Prior to Visit  Medication Sig Dispense Refill  . amLODipine (NORVASC) 5 MG tablet TAKE 1 TABLET BY MOUTH EVERY DAY 90 tablet 1  . atorvastatin (LIPITOR) 40 MG tablet TAKE 1 TABLET(40 MG) BY MOUTH DAILY 90 tablet 3  . losartan-hydrochlorothiazide (HYZAAR) 100-12.5 MG tablet TAKE 1 TABLET BY MOUTH EVERY DAY 90 tablet 1  . metFORMIN (GLUCOPHAGE) 500 MG tablet TAKE 1 TABLET(500 MG) BY MOUTH TWICE DAILY WITH A MEAL (Patient taking differently: 1,000 mg. Take two tablets by mouth twice daily) 180 tablet 1  . metoprolol succinate (TOPROL-XL) 50 MG 24 hr tablet TAKE 1 TABLET BY MOUTH EVERY DAY WITH OR IMMEDIATELY FOLLOWING A MEAL 90 tablet 1   No  facility-administered medications prior to visit.    Allergies  Allergen Reactions  . Penicillins     REACTION: hives    ROS Review of Systems  Constitutional: Negative for fatigue.  Eyes: Negative for visual disturbance.  Respiratory: Negative for cough, chest tightness, shortness of breath and wheezing.   Cardiovascular: Negative for chest pain, palpitations and leg swelling.  Neurological: Negative for dizziness, seizures, syncope, weakness, light-headedness and headaches.      Objective:    Physical Exam Constitutional:      Appearance: She is well-developed.  Eyes:     Pupils: Pupils are equal, round, and reactive to light.  Neck:     Thyroid: No thyromegaly.     Vascular: No JVD.  Cardiovascular:     Rate and Rhythm: Normal rate and regular rhythm.     Heart sounds: No gallop.   Pulmonary:     Effort: Pulmonary effort is normal. No respiratory distress.     Breath sounds: Normal breath sounds. No wheezing or rales.  Musculoskeletal:     Cervical back: Neck supple.  Neurological:     Mental Status: She is alert.     BP (!) 144/82 (BP Location: Left Arm, Patient Position: Sitting, Cuff Size: Normal)   Pulse 91   Temp 98.4 F (36.9 C) (Oral)   Wt 236 lb 14.4 oz (107.5 kg)   SpO2 97%   BMI 39.42 kg/m  Wt Readings from Last 3 Encounters:  04/21/20 236 lb 14.4 oz (107.5 kg)  08/20/19 248 lb 14.4 oz (112.9 kg)  04/02/19 250 lb 1.6 oz (113.4 kg)     Health Maintenance Due  Topic Date Due  . Hepatitis C Screening  Never done  . OPHTHALMOLOGY EXAM  Never done  . MAMMOGRAM  Never done  . COLONOSCOPY  Never done  . DEXA SCAN  Never done  . INFLUENZA VACCINE  04/02/2020    There are no preventive care reminders to display for this patient.  Lab Results  Component Value Date   TSH 0.66 03/15/2013   Lab Results  Component Value Date   WBC 19.6 (H) 06/05/2015   HGB 14.4 06/05/2015   HCT 42.1 06/05/2015   MCV 91.1 06/05/2015   PLT 282 06/05/2015    Lab Results  Component Value Date   NA 136 08/20/2019   K 4.1 08/20/2019   CO2 26 08/20/2019   GLUCOSE 154 (H) 08/20/2019   BUN 15 08/20/2019   CREATININE 0.72 08/20/2019   BILITOT 1.7 (H) 08/20/2019   ALKPHOS 96 08/20/2019  AST 20 08/20/2019   ALT 26 08/20/2019   PROT 7.2 08/20/2019   ALBUMIN 4.4 08/20/2019   CALCIUM 10.0 08/20/2019   ANIONGAP 11 06/05/2015   GFR 79.65 08/20/2019   Lab Results  Component Value Date   CHOL 151 08/20/2019   Lab Results  Component Value Date   HDL 43.30 08/20/2019   Lab Results  Component Value Date   LDLCALC 77 08/20/2019   Lab Results  Component Value Date   TRIG 153.0 (H) 08/20/2019   Lab Results  Component Value Date   CHOLHDL 3 08/20/2019   Lab Results  Component Value Date   HGBA1C 7.2 (H) 08/20/2019      Assessment & Plan:   #1 type 2 diabetes well controlled and improved with A1c 6.4%  -Needs to set up diabetic eye exam -We will plan routine follow-up in 6 months  #2 hypertension.  She has slightly elevated reading today but consistently well controlled at home  -She is challenged to lose some weight -Continue close monitoring and be in touch if consistently greater than 140/90  #3 dyslipidemia treated with Lipitor -Continue Lipitor and check lipid and hepatic panel at 65-month follow-up  No orders of the defined types were placed in this encounter.   Follow-up: Return in about 6 months (around 10/22/2020).    Evelena Peat, MD

## 2020-04-21 NOTE — Addendum Note (Signed)
Addended by: Raiford Simmonds R on: 04/21/2020 01:58 PM   Modules accepted: Orders

## 2020-05-04 ENCOUNTER — Telehealth: Payer: Self-pay | Admitting: Family Medicine

## 2020-05-04 NOTE — Telephone Encounter (Signed)
Spoke with patient she was driving heading out of town.  She stated to call her back around 05/29/20

## 2020-05-18 ENCOUNTER — Other Ambulatory Visit: Payer: Self-pay | Admitting: Family Medicine

## 2020-05-25 ENCOUNTER — Telehealth: Payer: Self-pay | Admitting: Family Medicine

## 2020-05-25 NOTE — Telephone Encounter (Signed)
Pt is calling in stating that she is out of Rx metformin due to her taking 2 in the morning and 2 in the afternoon and stated that she confirmed the dosage at her last appointment and Dr. Caryl Never told her that is correct.  Pt stated that the pharmacy stated that the insurance will not pay for her to get a refill on the Rx.  Pt would like to have a call back to confirm the correct dosage.

## 2020-05-29 ENCOUNTER — Telehealth: Payer: Self-pay | Admitting: Family Medicine

## 2020-05-29 MED ORDER — METFORMIN HCL 500 MG PO TABS: ORAL_TABLET | ORAL | 1 refills | Status: DC

## 2020-05-29 NOTE — Telephone Encounter (Signed)
Spoke with patient she was having medication issues and want me to call her back at a later time

## 2020-05-29 NOTE — Telephone Encounter (Signed)
Patient left a message Thursday, she knew [facility] was out of the office and Friday she never received a call from our office or from the pharmacy.  She had to purchase this Rx with no insurance coverage because she needed this medication.   The insurance needs a new Rx for metFORMIN (GLUCOPHAGE) 500 MG tablet with the correct instructions on how she is supposed to be taking this medication.  She stated that she was taking 2 tablets twice daily, now the Rx is stating 1 tablet twice daily and she has been taking 2 tablets twice daily and has ran out of this medication.  She would prefer to take 1 tablet twice daily since her numbers were good at her last visit.  She wants this resolved this afternoon or she will come to the office to talk to someone to get this resolved.   She would like to talk to Dr. Caryl Never if possible.  Please advise

## 2020-05-29 NOTE — Telephone Encounter (Signed)
Patient is aware and Rx sent with the correct directions.

## 2020-05-29 NOTE — Telephone Encounter (Signed)
Refill Metformin 500 mg take 2 tablets twice daily and refill for 6 months

## 2020-05-29 NOTE — Telephone Encounter (Signed)
Okay to send Metformin 1 tab twice daily?

## 2020-06-13 ENCOUNTER — Ambulatory Visit: Payer: Medicare PPO

## 2020-06-14 ENCOUNTER — Other Ambulatory Visit: Payer: Self-pay

## 2020-06-14 ENCOUNTER — Ambulatory Visit (INDEPENDENT_AMBULATORY_CARE_PROVIDER_SITE_OTHER): Payer: Medicare PPO

## 2020-06-14 DIAGNOSIS — Z1231 Encounter for screening mammogram for malignant neoplasm of breast: Secondary | ICD-10-CM | POA: Diagnosis not present

## 2020-06-14 DIAGNOSIS — E2839 Other primary ovarian failure: Secondary | ICD-10-CM | POA: Diagnosis not present

## 2020-06-14 DIAGNOSIS — Z1211 Encounter for screening for malignant neoplasm of colon: Secondary | ICD-10-CM | POA: Diagnosis not present

## 2020-06-14 DIAGNOSIS — Z0000 Encounter for general adult medical examination without abnormal findings: Secondary | ICD-10-CM

## 2020-06-14 NOTE — Patient Instructions (Addendum)
[name] , Thank you for taking time to come for your Medicare Wellness Visit. I appreciate your ongoing commitment to your health goals. Please review the following plan we discussed and let me know if I can assist you in the future.   Screening recommendations/referrals: Colonoscopy: Placed order today  Mammogram: Placed order Today Bone Density: Placed Order Today  Recommended yearly ophthalmology/optometry visit for glaucoma screening and checkup Recommended yearly dental visit for hygiene and checkup  Vaccinations: Influenza vaccine: Will call for appt for her and mom  Pneumococcal vaccine: Up to date Tdap vaccine: Up to date Shingles vaccine: Shingrix discussed. Please contact your pharmacy for coverage information.    Covid-19:Completed 1/29 & 10/29/19  Advanced directives: Advance directive discussed with you today. I have provided a copy for you to complete at home and have notarized. Once this is complete please bring a copy in to our office so we can scan it into your chart.  Conditions/risks identified: Get back to swimming, will send core and home exercise plans to do.   Next appointment: Follow up in one year for your annual wellness visit  Scheduled for 06/19/21 at 1:00 if need to reschedule you can at anytime before date.    Preventive Care 30 Years and Older, Female Preventive care refers to lifestyle choices and visits with your health care provider that can promote health and wellness. What does preventive care include?  A yearly physical exam. This is also called an annual well check.  Dental exams once or twice a year.  Routine eye exams. Ask your health care provider how often you should have your eyes checked.  Personal lifestyle choices, including:  Daily care of your teeth and gums.  Regular physical activity.  Eating a healthy diet.  Avoiding tobacco and drug use.  Limiting alcohol use.  Practicing safe sex.  Taking low-dose aspirin every  day.  Taking vitamin and mineral supplements as recommended by your health care provider. What happens during an annual well check? The services and screenings done by your health care provider during your annual well check will depend on your age, overall health, lifestyle risk factors, and family history of disease. Counseling  Your health care provider may ask you questions about your:  Alcohol use.  Tobacco use.  Drug use.  Emotional well-being.  Home and relationship well-being.  Sexual activity.  Eating habits.  History of falls.  Memory and ability to understand (cognition).  Work and work Astronomer.  Reproductive health. Screening  You may have the following tests or measurements:  Height, weight, and BMI.  Blood pressure.  Lipid and cholesterol levels. These may be checked every 5 years, or more frequently if you are over 82 years old.  Skin check.  Lung cancer screening. You may have this screening every year starting at age 86 if you have a 30-pack-year history of smoking and currently smoke or have quit within the past 15 years.  Fecal occult blood test (FOBT) of the stool. You may have this test every year starting at age 40.  Flexible sigmoidoscopy or colonoscopy. You may have a sigmoidoscopy every 5 years or a colonoscopy every 10 years starting at age 10.  Hepatitis C blood test.  Hepatitis B blood test.  Sexually transmitted disease (STD) testing.  Diabetes screening. This is done by checking your blood sugar (glucose) after you have not eaten for a while (fasting). You may have this done every 1-3 years.  Bone density scan. This is done  to screen for osteoporosis. You may have this done starting at age 92.  Mammogram. This may be done every 1-2 years. Talk to your health care provider about how often you should have regular mammograms. Talk with your health care provider about your test results, treatment options, and if necessary, the need  for more tests. Vaccines  Your health care provider may recommend certain vaccines, such as:  Influenza vaccine. This is recommended every year.  Tetanus, diphtheria, and acellular pertussis (Tdap, Td) vaccine. You may need a Td booster every 10 years.  Zoster vaccine. You may need this after age 35.  Pneumococcal 13-valent conjugate (PCV13) vaccine. One dose is recommended after age 73.  Pneumococcal polysaccharide (PPSV23) vaccine. One dose is recommended after age 19. Talk to your health care provider about which screenings and vaccines you need and how often you need them. This information is not intended to replace advice given to you by your health care provider. Make sure you discuss any questions you have with your health care provider. Document Released: 09/15/2015 Document Revised: 05/08/2016 Document Reviewed: 06/20/2015 Elsevier Interactive Patient Education  2017 [employer] Prevention in the Home Falls can cause injuries. They can happen to people of all ages. There are many things you can do to make your home safe and to help prevent falls. What can I do on the outside of my home?  Regularly fix the edges of walkways and driveways and fix any cracks.  Remove anything that might make you trip as you walk through a door, such as a raised step or threshold.  Trim any bushes or trees on the path to your home.  Use bright outdoor lighting.  Clear any walking paths of anything that might make someone trip, such as rocks or tools.  Regularly check to see if handrails are loose or broken. Make sure that both sides of any steps have handrails.  Any raised decks and porches should have guardrails on the edges.  Have any leaves, snow, or ice cleared regularly.  Use sand or salt on walking paths during winter.  Clean up any spills in your garage right away. This includes oil or grease spills. What can I do in the bathroom?  Use night lights.  Install grab bars  by the toilet and in the tub and shower. Do not use towel bars as grab bars.  Use non-skid mats or decals in the tub or shower.  If you need to sit down in the shower, use a plastic, non-slip stool.  Keep the floor dry. Clean up any water that spills on the floor as soon as it happens.  Remove soap buildup in the tub or shower regularly.  Attach bath mats securely with double-sided non-slip rug tape.  Do not have throw rugs and other things on the floor that can make you trip. What can I do in the bedroom?  Use night lights.  Make sure that you have a light by your bed that is easy to reach.  Do not use any sheets or blankets that are too big for your bed. They should not hang down onto the floor.  Have a firm chair that has side arms. You can use this for support while you get dressed.  Do not have throw rugs and other things on the floor that can make you trip. What can I do in the kitchen?  Clean up any spills right away.  Avoid walking on wet floors.  Keep items  that you use a lot in easy-to-reach places.  If you need to reach something above you, use a strong step stool that has a grab bar.  Keep electrical cords out of the way.  Do not use floor polish or wax that makes floors slippery. If you must use wax, use non-skid floor wax.  Do not have throw rugs and other things on the floor that can make you trip. What can I do with my stairs?  Do not leave any items on the stairs.  Make sure that there are handrails on both sides of the stairs and use them. Fix handrails that are broken or loose. Make sure that handrails are as long as the stairways.  Check any carpeting to make sure that it is firmly attached to the stairs. Fix any carpet that is loose or worn.  Avoid having throw rugs at the top or bottom of the stairs. If you do have throw rugs, attach them to the floor with carpet tape.  Make sure that you have a light switch at the top of the stairs and the  bottom of the stairs. If you do not have them, ask someone to add them for you. What else can I do to help prevent falls?  Wear shoes that:  Do not have high heels.  Have rubber bottoms.  Are comfortable and fit you well.  Are closed at the toe. Do not wear sandals.  If you use a stepladder:  Make sure that it is fully opened. Do not climb a closed stepladder.  Make sure that both sides of the stepladder are locked into place.  Ask someone to hold it for you, if possible.  Clearly mark and make sure that you can see:  Any grab bars or handrails.  First and last steps.  Where the edge of each step is.  Use tools that help you move around (mobility aids) if they are needed. These include:  Canes.  Walkers.  Scooters.  Crutches.  Turn on the lights when you go into a dark area. Replace any light bulbs as soon as they burn out.  Set up your furniture so you have a clear path. Avoid moving your furniture around.  If any of your floors are uneven, fix them.  If there are any pets around you, be aware of where they are.  Review your medicines with your doctor. Some medicines can make you feel dizzy. This can increase your chance of falling. Ask your doctor what other things that you can do to help prevent falls. This information is not intended to replace advice given to you by your health care provider. Make sure you discuss any questions you have with your health care provider. Document Released: 06/15/2009 Document Revised: 01/25/2016 Document Reviewed: 09/23/2014 Elsevier Interactive Patient Education  2017 [employer].

## 2020-06-14 NOTE — Progress Notes (Signed)
Virtual Visit via Telephone Note  I connected with  Joanne Mendoza on 06/14/20 at  1:00 PM EDT by telephone and verified that I am speaking with the correct person using two identifiers.  Medicare Annual Wellness visit completed telephonically due to Covid-19 pandemic.   Persons participating in this call: This Health Coach and this patient.   Location: Patient: Home Provider: Office   I discussed the limitations, risks, security and privacy concerns of performing an evaluation and management service by telephone and the availability of in person appointments. The patient expressed understanding and agreed to proceed.  Unable to perform video visit due to video visit attempted and failed and/or patient does not have video capability.   Some vital signs may be absent or patient reported.   Marzella Schleinina H Liora Myles, LPN    Subjective:   Joanne Mendoza is a 74 y.o. female who presents for Medicare Annual (Subsequent) preventive examination.  Review of Systems     Cardiac Risk Factors include: advanced age (>5355men, 46>65 women);diabetes mellitus;dyslipidemia     Objective:    There were no vitals filed for this visit. There is no height or weight on file to calculate BMI.  Advanced Directives 06/14/2020 10/08/2017 06/05/2015  Does Patient Have a Medical Advance Directive? No No No  Would patient like information on creating a medical advance directive? Yes (MAU/Ambulatory/Procedural Areas - Information given) - No - patient declined information    Current Medications (verified) Outpatient Encounter Medications as of 06/14/2020  Medication Sig  . amLODipine (NORVASC) 5 MG tablet TAKE 1 TABLET BY MOUTH EVERY DAY  . atorvastatin (LIPITOR) 40 MG tablet TAKE 1 TABLET(40 MG) BY MOUTH DAILY  . losartan-hydrochlorothiazide (HYZAAR) 100-12.5 MG tablet TAKE 1 TABLET BY MOUTH EVERY DAY  . metFORMIN (GLUCOPHAGE) 500 MG tablet Take two tablets by mouth twice daily  . metoprolol succinate (TOPROL-XL)  50 MG 24 hr tablet TAKE 1 TABLET BY MOUTH EVERY DAY WITH OR IMMEDIATELY FOLLOWING A MEAL   No facility-administered encounter medications on file as of 06/14/2020.    Allergies (verified) Penicillins   History: Past Medical History:  Diagnosis Date  . Hyperlipidemia   . Hypertension    Past Surgical History:  Procedure Laterality Date  . broken arm    . CESAREAN SECTION     x 2    Family History  Problem Relation Age of Onset  . Alcohol abuse Father   . Hyperlipidemia Other        Grandparent   . Hypertension Mother   . Colon cancer Paternal Uncle   . Pancreatic cancer Paternal Uncle    Social History   Socioeconomic History  . Marital status: Divorced    Spouse name: Not on file  . Number of children: Not on file  . Years of education: Not on file  . Highest education level: Not on file  Occupational History  . Occupation: retired  Tobacco Use  . Smoking status: Never Smoker  . Smokeless tobacco: Never Used  Vaping Use  . Vaping Use: Never used  Substance and Sexual Activity  . Alcohol use: No    Comment: 4 times a year wine  . Drug use: No  . Sexual activity: Not on file  Other Topics Concern  . Not on file  Social History Narrative  . Not on file   Social Determinants of Health   Financial Resource Strain: Low Risk   . Difficulty of Paying Living Expenses: Not hard at all  Food  Insecurity: No Food Insecurity  . Worried About Programme researcher, broadcasting/film/video in the Last Year: Never true  . Ran Out of Food in the Last Year: Never true  Transportation Needs: No Transportation Needs  . Lack of Transportation (Medical): No  . Lack of Transportation (Non-Medical): No  Physical Activity: Inactive  . Days of Exercise per Week: 0 days  . Minutes of Exercise per Session: 0 min  Stress: No Stress Concern Present  . Feeling of Stress : Only a little  Social Connections: Moderately Integrated  . Frequency of Communication with Friends and Family: More than three times a  week  . Frequency of Social Gatherings with Friends and Family: More than three times a week  . Attends Religious Services: More than 4 times per year  . Active Member of Clubs or Organizations: Yes  . Attends Banker Meetings: 1 to 4 times per year  . Marital Status: Divorced    Tobacco Counseling Counseling given: Not Answered   Clinical Intake:  Pre-visit preparation completed: Yes  Pain : No/denies pain     BMI - recorded: 39.42 Nutritional Status: BMI > 30  Obese Diabetes: Yes CBG done?: No Did pt. bring in CBG monitor from home?: No  How often do you need to have someone help you when you read instructions, pamphlets, or other written materials from your doctor or pharmacy?: 1 - Never  Diabetic?Yes  Interpreter Needed?: No  Information entered by :: Lanier Ensign, LPN   Activities of Daily Living In your present state of health, do you have any difficulty performing the following activities: 06/14/2020  Hearing? N  Vision? N  Difficulty concentrating or making decisions? N  Walking or climbing stairs? N  Dressing or bathing? N  Doing errands, shopping? N  Preparing Food and eating ? N  Using the Toilet? N  In the past six months, have you accidently leaked urine? N  Do you have problems with loss of bowel control? N  Managing your Medications? N  Managing your Finances? N  Housekeeping or managing your Housekeeping? N  Some recent data might be hidden    Patient Care Team: Kristian Covey, MD as PCP - General  Indicate any recent Medical Services you may have received from other than [facility] providers in the past year (date may be approximate).     Assessment:   This is a routine wellness examination for Joanne Mendoza.  Hearing/Vision screen  Hearing Screening   125Hz  250Hz  500Hz  1000Hz  2000Hz  3000Hz  4000Hz  6000Hz  8000Hz   Right ear:           Left ear:           Comments: Pt denies any hearing at this time  Vision Screening Comments:  Pt will follow up with eye exams in January since new enrollment  Dietary issues and exercise activities discussed: Current Exercise Habits: The patient does not participate in regular exercise at present (wants to start back swimming at the [employer])  Goals    . Patient Stated     Planing on eye exams first of the year and get back to swimming    . Weight (lb) < 200 lb (90.7 kg)     Set a 2 to 3 month goal;  2 lbs weight loss a month  Will try to go back to swimming   Swims at 8:30pm; try to schedule in       Depression Screen PHQ 2/9 Scores 06/14/2020 04/21/2020 10/08/2017 08/07/2016 05/30/2015  11/02/2013  PHQ - 2 Score 0 0 0 0 0 0    Fall Risk Fall Risk  06/14/2020 04/21/2020 10/08/2017 08/07/2016 05/30/2015  Falls in the past year? 1 0 No No No  Number falls in past yr: 1 0 - - -  Injury with Fall? 1 0 - - -  Comment bruise knee and scratch hands - - - -  Follow up - Falls evaluation completed - - -    Any stairs in or around the home? Yes  If so, are there any without handrails? No  Home free of loose throw rugs in walkways, pet beds, electrical cords, etc? Yes  Adequate lighting in your home to reduce risk of falls? Yes   ASSISTIVE DEVICES UTILIZED TO PREVENT FALLS:  Life alert? No  Use of a cane, walker or w/c? No  Grab bars in the bathroom? Yes  Shower chair or bench in shower? No  Elevated toilet seat or a handicapped toilet? Yes   TIMED UP AND GO:  Was the test performed? Yes .     Cognitive Function:     6CIT Screen 06/14/2020  What Year? 0 points  What month? 0 points  Count back from 20 0 points  Months in reverse 0 points  Repeat phrase 0 points    Immunizations Immunization History  Administered Date(s) Administered  . Influenza, High Dose Seasonal PF 06/27/2015, 08/07/2016, 05/23/2017, 06/18/2018  . Influenza,inj,Quad PF,6+ Mos 05/20/2013, 07/14/2014  . Moderna SARS-COVID-2 Vaccination 10/01/2019, 10/29/2019  . Pneumococcal Conjugate-13 06/27/2015    . Pneumococcal Polysaccharide-23 03/22/2013  . Tdap 03/22/2013  . Zoster 05/20/2013    TDAP status: Up to date Flu Vaccine status: Declined, Education has been provided regarding the importance of this vaccine but patient still declined. Advised may receive this vaccine at local pharmacy or Health Dept. Aware to provide a copy of the vaccination record if obtained from local pharmacy or Health Dept. Verbalized acceptance and understanding. Pt will make appt for flu shot. Pneumococcal vaccine status: Up to date Covid-19 vaccine status: Completed vaccines  Qualifies for Shingles Vaccine? Yes   Zostavax completed Yes   Shingrix Completed?: No.    Education has been provided regarding the importance of this vaccine. Patient has been advised to call insurance company to determine out of pocket expense if they have not yet received this vaccine. Advised may also receive vaccine at local pharmacy or Health Dept. Verbalized acceptance and understanding.  Screening Tests Health Maintenance  Topic Date Due  . Hepatitis C Screening  Never done  . OPHTHALMOLOGY EXAM  Never done  . MAMMOGRAM  Never done  . COLONOSCOPY  Never done  . DEXA SCAN  Never done  . INFLUENZA VACCINE  04/02/2020  . FOOT EXAM  08/19/2020  . HEMOGLOBIN A1C  10/22/2020  . TETANUS/TDAP  03/23/2023  . COVID-19 Vaccine  Completed  . PNA vac Low Risk Adult  Completed    Health Maintenance  Health Maintenance Due  Topic Date Due  . Hepatitis C Screening  Never done  . OPHTHALMOLOGY EXAM  Never done  . MAMMOGRAM  Never done  . COLONOSCOPY  Never done  . DEXA SCAN  Never done  . INFLUENZA VACCINE  04/02/2020    Colorectal cancer screening: Referral to GI placed 06/14/20. Pt aware the office will call re: appt. Mammogram status: Ordered 06/14/20. Pt provided with contact info and advised to call to schedule appt.  Bone Density status: Ordered 06/14/20. Pt provided with contact info  and advised to call to schedule  appt.   Additional Screening:  Hepatitis C Screening: does qualify  Vision Screening: Recommended annual ophthalmology exams for early detection of glaucoma and other disorders of the eye. Is the patient up to date with their annual eye exam?  No  Who is the provider or what is the name of the office in which the patient attends annual eye exams? Will follow up 2022  Dental Screening: Recommended annual dental exams for proper oral hygiene  Community Resource Referral / Chronic Care Management: CRR required this visit?  No   CCM required this visit?  No      Plan:     I have personally reviewed and noted the following in the patient's chart:   . Medical and social history . Use of alcohol, tobacco or illicit drugs  . Current medications and supplements . Functional ability and status . Nutritional status . Physical activity . Advanced directives . List of other physicians . Hospitalizations, surgeries, and ER visits in previous 12 months . Vitals . Screenings to include cognitive, depression, and falls . Referrals and appointments  In addition, I have reviewed and discussed with patient certain preventive protocols, quality metrics, and best practice recommendations. A written personalized care plan for preventive services as well as general preventive health recommendations were provided to patient.     Marzella Schlein, LPN   [id number]   Nurse Notes: None

## 2020-06-27 ENCOUNTER — Other Ambulatory Visit: Payer: Self-pay

## 2020-06-27 ENCOUNTER — Ambulatory Visit (INDEPENDENT_AMBULATORY_CARE_PROVIDER_SITE_OTHER): Payer: Medicare PPO

## 2020-06-27 DIAGNOSIS — Z23 Encounter for immunization: Secondary | ICD-10-CM

## 2020-09-08 ENCOUNTER — Other Ambulatory Visit: Payer: Self-pay | Admitting: Family Medicine

## 2020-10-10 ENCOUNTER — Other Ambulatory Visit: Payer: Self-pay | Admitting: Family Medicine

## 2020-10-25 ENCOUNTER — Ambulatory Visit: Payer: Medicare PPO | Admitting: Family Medicine

## 2020-11-20 ENCOUNTER — Ambulatory Visit: Payer: Medicare PPO | Admitting: Family Medicine

## 2020-11-24 ENCOUNTER — Other Ambulatory Visit: Payer: Self-pay | Admitting: Family Medicine

## 2020-11-27 ENCOUNTER — Ambulatory Visit: Payer: Medicare PPO | Admitting: Family Medicine

## 2020-12-21 DIAGNOSIS — Z23 Encounter for immunization: Secondary | ICD-10-CM | POA: Diagnosis not present

## 2021-03-27 ENCOUNTER — Telehealth (INDEPENDENT_AMBULATORY_CARE_PROVIDER_SITE_OTHER): Payer: Medicare PPO | Admitting: Family Medicine

## 2021-03-27 DIAGNOSIS — U071 COVID-19: Secondary | ICD-10-CM

## 2021-03-27 MED ORDER — MOLNUPIRAVIR EUA 200MG CAPSULE: 4.0000 | ORAL_CAPSULE | Freq: Two times a day (BID) | ORAL | 0 refills | Status: AC

## 2021-03-27 NOTE — Progress Notes (Signed)
Patient ID: Joanne Mendoza, female   DOB: [birth year], 75 y.o.   MRN: [id number]  This visit type was conducted due to national recommendations for restrictions regarding the COVID-19 pandemic in an effort to limit this patient's exposure and mitigate transmission in our community.   Virtual Visit via Telephone Note  I connected with Joanne Mendoza on 03/27/21 at 11:00 AM EDT by telephone and verified that I am speaking with the correct person using two identifiers.   I discussed the limitations, risks, security and privacy concerns of performing an evaluation and management service by telephone and the availability of in person appointments. I also discussed with the patient that there may be a patient responsible charge related to this service. The patient expressed understanding and agreed to proceed.  Location patient: home Location provider: work or home office Participants present for the call: patient, provider Patient did not have a visit in the prior 7 days to address this/these issue(s).   History of Present Illness:  Joanne Mendoza has COVID-19 infection.  She states that she started having symptoms Saturday evening with some congestion and cough.  Her cough has been relatively mild.  No dyspnea.  No body aches.  Possible low-grade fever of the weekend but none today.  She did a home COVID test yesterday which came back positive.  She has had Moderna vaccine as well as 2 boosters.  She helps take care of her mother who is in her late 61s but she has been away from her for the past few days.  Patient has comorbidities of age of 52 along with hypertension and type 2 diabetes.  She denies any nausea, vomiting, or diarrhea.  No chronic heart or lung problems.  Past Medical History:  Diagnosis Date   Hyperlipidemia    Hypertension    Past Surgical History:  Procedure Laterality Date   broken arm     CESAREAN SECTION     x 2     reports that she has never smoked. She has never used smokeless  tobacco. She reports that she does not drink alcohol and does not use drugs. family history includes Alcohol abuse in her father; Colon cancer in her paternal uncle; Hyperlipidemia in an other family member; Hypertension in her mother; Pancreatic cancer in her paternal uncle. Allergies  Allergen Reactions   Penicillins     REACTION: hives      Observations/Objective: Patient sounds cheerful and well on the phone. I do not appreciate any SOB. Speech and thought processing are grossly intact. Patient reported vitals:  Assessment and Plan:  COVID-19 infection.  This is basically day 2.  She is coping fairly well overall but does have increased risk in terms of age and comorbidities as above  -We had a discussion regarding antiviral therapy.  She is on multiple medications and has not had any recent blood work and we therefore did not recommend Paxlovid.  Recommend Molnupiravir 4 capsules twice daily for 5 days -She is aware of CDC guidelines regarding isolation -Follow-up immediately for any fever or increasing shortness of breath.  Follow Up Instructions:   99441 5-10 99442 11-20 99443 21-30 I did not refer this patient for an OV in the next 24 hours for this/these issue(s).  I discussed the assessment and treatment plan with the patient. The patient was provided an opportunity to ask questions and all were answered. The patient agreed with the plan and demonstrated an understanding of the instructions.   The patient was advised to  call back or seek an in-person evaluation if the symptoms worsen or if the condition fails to improve as anticipated.  I provided 15 minutes of non-face-to-face time during this encounter.   Evelena Peat, MD

## 2021-04-09 ENCOUNTER — Other Ambulatory Visit: Payer: Self-pay | Admitting: Family Medicine

## 2021-04-20 ENCOUNTER — Ambulatory Visit: Payer: Medicare PPO | Admitting: Family Medicine

## 2021-04-20 ENCOUNTER — Other Ambulatory Visit: Payer: Self-pay

## 2021-04-20 VITALS — BP 150/74 | HR 90 | Temp 98.5°F | Wt 231.9 lb

## 2021-04-20 DIAGNOSIS — I10 Essential (primary) hypertension: Secondary | ICD-10-CM

## 2021-04-20 DIAGNOSIS — E785 Hyperlipidemia, unspecified: Secondary | ICD-10-CM

## 2021-04-20 DIAGNOSIS — E1169 Type 2 diabetes mellitus with other specified complication: Secondary | ICD-10-CM | POA: Diagnosis not present

## 2021-04-20 DIAGNOSIS — E1165 Type 2 diabetes mellitus with hyperglycemia: Secondary | ICD-10-CM

## 2021-04-20 LAB — BASIC METABOLIC PANEL
BUN: 15 mg/dL (ref 6–23)
CO2: 25 mEq/L (ref 19–32)
Calcium: 9.8 mg/dL (ref 8.4–10.5)
Chloride: 101 mEq/L (ref 96–112)
Creatinine, Ser: 0.68 mg/dL (ref 0.40–1.20)
GFR: 86.31 mL/min (ref >60.00)
Glucose, Bld: 156 mg/dL — ABNORMAL HIGH (ref 70–99)
Potassium: 4 mEq/L (ref 3.5–5.1)
Sodium: 138 mEq/L (ref 135–145)

## 2021-04-20 LAB — LIPID PANEL
Cholesterol: 134 mg/dL (ref 0–200)
HDL: 53.8 mg/dL (ref >39.00)
LDL Cholesterol: 56 mg/dL (ref 0–99)
NonHDL: 80.68
Total CHOL/HDL Ratio: 2
Triglycerides: 122 mg/dL (ref 0.0–149.0)
VLDL: 24.4 mg/dL (ref 0.0–40.0)

## 2021-04-20 LAB — HEPATIC FUNCTION PANEL
ALT: 18 U/L (ref 0–35)
AST: 15 U/L (ref 0–37)
Albumin: 4.2 g/dL (ref 3.5–5.2)
Alkaline Phosphatase: 71 U/L (ref 39–117)
Bilirubin, Direct: 0.3 mg/dL (ref 0.0–0.3)
Total Bilirubin: 1.9 mg/dL — ABNORMAL HIGH (ref 0.2–1.2)
Total Protein: 6.8 g/dL (ref 6.0–8.3)

## 2021-04-20 LAB — POCT GLYCOSYLATED HEMOGLOBIN (HGB A1C): Hemoglobin A1C: 6.4 % — AB (ref 4.0–5.6)

## 2021-04-20 NOTE — Patient Instructions (Signed)
A1C was 6.4% which is good  Monitor blood pressure and be in touch if consistently > 140/90

## 2021-04-20 NOTE — Progress Notes (Signed)
Established Patient Office Visit  Subjective:  Patient ID: Joanne Mendoza, female    DOB: 18-Feb-1946  Age: 75 y.o. MRN: [id number]  CC:  Chief Complaint  Patient presents with   Follow-up    HPI [name] presents for medical follow-up.  She had COVID infection end of July.  She took antiviral therapy and felt better very quickly.  She had some mild residual fatigue but is recovering.  Her chronic problems include history of obesity, hypertension, hyperlipidemia, type 2 diabetes.  Last A1c was a year ago and was 6.4%.  She does not monitor blood sugars regularly.  She takes metformin.  Hypertension treated with losartan HCTZ, amlodipine, and metoprolol.  She states her blood pressures at home are fairly consistently around 130 systolic and 70s diastolic.  She feels that she has "whitecoat syndrome".  She is on atorvastatin 40 mg daily for hyperlipidemia.  She states she is compliant with medications.  Denies any recent chest pains.  She had very challenging year taking care of her mom who has advanced dementia.  She has a brother who helps care for her as well.  Past Medical History:  Diagnosis Date   Hyperlipidemia    Hypertension     Past Surgical History:  Procedure Laterality Date   broken arm     CESAREAN SECTION     x 2     Family History  Problem Relation Age of Onset   Alcohol abuse Father    Hyperlipidemia Other        Grandparent    Hypertension Mother    Colon cancer Paternal Uncle    Pancreatic cancer Paternal Uncle     Social History   Socioeconomic History   Marital status: Divorced    Spouse name: Not on file   Number of children: Not on file   Years of education: Not on file   Highest education level: Not on file  Occupational History   Occupation: retired  Tobacco Use   Smoking status: Never   Smokeless tobacco: Never  Vaping Use   Vaping Use: Never used  Substance and Sexual Activity   Alcohol use: No    Comment: 4 times a year wine    Drug use: No   Sexual activity: Not on file  Other Topics Concern   Not on file  Social History Narrative   Not on file   Social Determinants of Health   Financial Resource Strain: Low Risk    Difficulty of Paying Living Expenses: Not hard at all  Food Insecurity: No Food Insecurity   Worried About Programme researcher, broadcasting/film/video in the Last Year: Never true   Ran Out of Food in the Last Year: Never true  Transportation Needs: No Transportation Needs   Lack of Transportation (Medical): No   Lack of Transportation (Non-Medical): No  Physical Activity: Inactive   Days of Exercise per Week: 0 days   Minutes of Exercise per Session: 0 min  Stress: No Stress Concern Present   Feeling of Stress : Only a little  Social Connections: Moderately Integrated   Frequency of Communication with Friends and Family: More than three times a week   Frequency of Social Gatherings with Friends and Family: More than three times a week   Attends Religious Services: More than 4 times per year   Active Member of [employer] or Organizations: Yes   Attends Banker Meetings: 1 to 4 times per year   Marital Status: Divorced  Intimate Partner Violence: Not At Risk   Fear of Current or Ex-Partner: No   Emotionally Abused: No   Physically Abused: No   Sexually Abused: No    Outpatient Medications Prior to Visit  Medication Sig Dispense Refill   amLODipine (NORVASC) 5 MG tablet TAKE 1 TABLET BY MOUTH EVERY DAY 90 tablet 1   atorvastatin (LIPITOR) 40 MG tablet TAKE 1 TABLET(40 MG) BY MOUTH DAILY 90 tablet 3   losartan-hydrochlorothiazide (HYZAAR) 100-12.5 MG tablet TAKE 1 TABLET BY MOUTH EVERY DAY 90 tablet 1   metFORMIN (GLUCOPHAGE) 500 MG tablet TAKE 2 TABLETS BY MOUTH TWICE DAILY 360 tablet 1   metoprolol succinate (TOPROL-XL) 50 MG 24 hr tablet TAKE 1 TABLET BY MOUTH EVERY DAY WITH OR IMMEDIATELY FOLLOWING A MEAL 90 tablet 1   No facility-administered medications prior to visit.    Allergies   Allergen Reactions   Penicillins     REACTION: hives    Wt Readings from Last 3 Encounters:  04/20/21 231 lb 14.4 oz (105.2 kg)  04/21/20 236 lb 14.4 oz (107.5 kg)  08/20/19 248 lb 14.4 oz (112.9 kg)     ROS Review of Systems  Constitutional:  Positive for fatigue. Negative for unexpected weight change.  Eyes:  Negative for visual disturbance.  Respiratory:  Negative for cough, chest tightness, shortness of breath and wheezing.   Cardiovascular:  Negative for chest pain, palpitations and leg swelling.  Endocrine: Negative for polydipsia and polyuria.  Neurological:  Negative for dizziness, seizures, syncope, weakness, light-headedness and headaches.     Objective:    Physical Exam Constitutional:      Appearance: She is well-developed.  Eyes:     Pupils: Pupils are equal, round, and reactive to light.  Neck:     Thyroid: No thyromegaly.     Vascular: No JVD.  Cardiovascular:     Rate and Rhythm: Normal rate and regular rhythm.     Heart sounds:    No gallop.  Pulmonary:     Effort: Pulmonary effort is normal. No respiratory distress.     Breath sounds: Normal breath sounds. No wheezing or rales.  Musculoskeletal:     Cervical back: Neck supple.     Right lower leg: No edema.     Left lower leg: No edema.  Neurological:     Mental Status: She is alert.    BP (!) 150/74 (BP Location: Left Arm, Cuff Size: Large)   Pulse 90   Temp 98.5 F (36.9 C) (Oral)   Wt 231 lb 14.4 oz (105.2 kg)   SpO2 98%   BMI 38.59 kg/m  Wt Readings from Last 3 Encounters:  04/20/21 231 lb 14.4 oz (105.2 kg)  04/21/20 236 lb 14.4 oz (107.5 kg)  08/20/19 248 lb 14.4 oz (112.9 kg)     Health Maintenance Due  Topic Date Due   OPHTHALMOLOGY EXAM  Never done   Hepatitis C Screening  Never done   COLONOSCOPY (Pts 45-13yrs Insurance coverage will need to be confirmed)  Never done   MAMMOGRAM  Never done   Zoster Vaccines- Shingrix (1 of 2) Never done   DEXA SCAN  Never done    COVID-19 Vaccine (3 - Booster for Moderna series) 03/27/2020   FOOT EXAM  08/19/2020   INFLUENZA VACCINE  04/02/2021    There are no preventive care reminders to display for this patient.  Lab Results  Component Value Date   TSH 0.66 03/15/2013   Lab Results  Component  Value Date   WBC 19.6 (H) 06/05/2015   HGB 14.4 06/05/2015   HCT 42.1 06/05/2015   MCV 91.1 06/05/2015   PLT 282 06/05/2015   Lab Results  Component Value Date   NA 136 08/20/2019   K 4.1 08/20/2019   CO2 26 08/20/2019   GLUCOSE 154 (H) 08/20/2019   BUN 15 08/20/2019   CREATININE 0.72 08/20/2019   BILITOT 1.7 (H) 08/20/2019   ALKPHOS 96 08/20/2019   AST 20 08/20/2019   ALT 26 08/20/2019   PROT 7.2 08/20/2019   ALBUMIN 4.4 08/20/2019   CALCIUM 10.0 08/20/2019   ANIONGAP 11 06/05/2015   GFR 79.65 08/20/2019   Lab Results  Component Value Date   CHOL 151 08/20/2019   Lab Results  Component Value Date   HDL 43.30 08/20/2019   Lab Results  Component Value Date   LDLCALC 77 08/20/2019   Lab Results  Component Value Date   TRIG 153.0 (H) 08/20/2019   Lab Results  Component Value Date   CHOLHDL 3 08/20/2019   Lab Results  Component Value Date   HGBA1C 6.4 (A) 04/20/2021      Assessment & Plan:   #1 type 2 diabetes stable with A1c 6.4%.  She has done a good job with some weight loss this past year which is likely helping.  Continue metformin Continue yearly eye exam Reassess 6 months  #2 hypertension.  Improved some after rest here today but still suboptimal.  However, she has consistently had good readings at home. -Continue weight loss efforts -Continue to monitor and be in touch if consistently greater than 140/90  #3 dyslipidemia treated with Lipitor -Recheck lipid and hepatic panel.   No orders of the defined types were placed in this encounter.   Follow-up: Return in about 6 months (around 10/21/2021).    Evelena Peat, MD

## 2021-04-20 NOTE — Addendum Note (Signed)
Addended by: Kandra Nicolas on: 04/20/2021 01:47 PM   Modules accepted: Orders

## 2021-05-28 ENCOUNTER — Other Ambulatory Visit: Payer: Self-pay | Admitting: Family Medicine

## 2021-06-19 ENCOUNTER — Ambulatory Visit: Payer: Medicare PPO

## 2021-07-05 DIAGNOSIS — Z23 Encounter for immunization: Secondary | ICD-10-CM | POA: Diagnosis not present

## 2021-07-10 ENCOUNTER — Ambulatory Visit (INDEPENDENT_AMBULATORY_CARE_PROVIDER_SITE_OTHER): Payer: Medicare PPO

## 2021-07-10 VITALS — Ht 65.5 in | Wt 230.0 lb

## 2021-07-10 DIAGNOSIS — Z1211 Encounter for screening for malignant neoplasm of colon: Secondary | ICD-10-CM

## 2021-07-10 DIAGNOSIS — Z1231 Encounter for screening mammogram for malignant neoplasm of breast: Secondary | ICD-10-CM | POA: Diagnosis not present

## 2021-07-10 DIAGNOSIS — Z13820 Encounter for screening for osteoporosis: Secondary | ICD-10-CM

## 2021-07-10 DIAGNOSIS — Z0000 Encounter for general adult medical examination without abnormal findings: Secondary | ICD-10-CM | POA: Diagnosis not present

## 2021-07-10 NOTE — Progress Notes (Signed)
I connected with Joanne Mendoza today by telephone and verified that I am speaking with the correct person using two identifiers. Location patient: home Location provider: work Persons participating in the virtual visit: Bailie, [place] LPN.   I discussed the limitations, risks, security and privacy concerns of performing an evaluation and management service by telephone and the availability of in person appointments. I also discussed with the patient that there may be a patient responsible charge related to this service. The patient expressed understanding and verbally consented to this telephonic visit.    Interactive audio and video telecommunications were attempted between this provider and patient, however failed, due to patient having technical difficulties OR patient did not have access to video capability.  We continued and completed visit with audio only.     Vital signs may be patient reported or missing.  Subjective:   Joanne Mendoza is a 75 y.o. female who presents for Medicare Annual (Subsequent) preventive examination.  Review of Systems     Cardiac Risk Factors include: advanced age (>46men, >35 women);diabetes mellitus;dyslipidemia;obesity (BMI >30kg/m2);sedentary lifestyle     Objective:    Today's Vitals   07/10/21 1327  Weight: 230 lb (104.3 kg)  Height: 5' 5.5" (1.664 m)   Body mass index is 37.69 kg/m.  Advanced Directives 07/10/2021 06/14/2020 10/08/2017 06/05/2015  Does Patient Have a Medical Advance Directive? No No No No  Would patient like information on creating a medical advance directive? - Yes (MAU/Ambulatory/Procedural Areas - Information given) - No - patient declined information    Current Medications (verified) Outpatient Encounter Medications as of 07/10/2021  Medication Sig   amLODipine (NORVASC) 5 MG tablet TAKE 1 TABLET BY MOUTH EVERY DAY   atorvastatin (LIPITOR) 40 MG tablet TAKE 1 TABLET(40 MG) BY MOUTH DAILY    losartan-hydrochlorothiazide (HYZAAR) 100-12.5 MG tablet TAKE 1 TABLET BY MOUTH EVERY DAY   metFORMIN (GLUCOPHAGE) 500 MG tablet TAKE 2 TABLETS BY MOUTH TWICE DAILY   metoprolol succinate (TOPROL-XL) 50 MG 24 hr tablet TAKE 1 TABLET BY MOUTH EVERY DAY WITH OR IMMEDIATELY FOLLOWING A MEAL   No facility-administered encounter medications on file as of 07/10/2021.    Allergies (verified) Penicillins   History: Past Medical History:  Diagnosis Date   Hyperlipidemia    Hypertension    Past Surgical History:  Procedure Laterality Date   broken arm     CESAREAN SECTION     x 2    Family History  Problem Relation Age of Onset   Alcohol abuse Father    Hyperlipidemia Other        Grandparent    Hypertension Mother    Colon cancer Paternal Uncle    Pancreatic cancer Paternal Uncle    Social History   Socioeconomic History   Marital status: Divorced    Spouse name: Not on file   Number of children: Not on file   Years of education: Not on file   Highest education level: Not on file  Occupational History   Occupation: retired  Tobacco Use   Smoking status: Never   Smokeless tobacco: Never  Vaping Use   Vaping Use: Never used  Substance and Sexual Activity   Alcohol use: No    Comment: 4 times a year wine   Drug use: No   Sexual activity: Not on file  Other Topics Concern   Not on file  Social History Narrative   Not on file   Social Determinants of Health   Financial  Resource Strain: Low Risk    Difficulty of Paying Living Expenses: Not hard at all  Food Insecurity: No Food Insecurity   Worried About Programme researcher, broadcasting/film/video in the Last Year: Never true   Ran Out of Food in the Last Year: Never true  Transportation Needs: No Transportation Needs   Lack of Transportation (Medical): No   Lack of Transportation (Non-Medical): No  Physical Activity: Inactive   Days of Exercise per Week: 0 days   Minutes of Exercise per Session: 0 min  Stress: No Stress Concern Present    Feeling of Stress : Not at all  Social Connections: Not on file    Tobacco Counseling Counseling given: Not Answered   Clinical Intake:  Pre-visit preparation completed: Yes  Pain : No/denies pain     Nutritional Status: BMI > 30  Obese Nutritional Risks: None Diabetes: Yes  How often do you need to have someone help you when you read instructions, pamphlets, or other written materials from your doctor or pharmacy?: 1 - Never What is the last grade level you completed in school?: masters degree  Diabetic? Yes Nutrition Risk Assessment:  Has the patient had any N/V/D within the last 2 months?  No  Does the patient have any non-healing wounds?  No  Has the patient had any unintentional weight loss or weight gain?  No   Diabetes:  Is the patient diabetic?  Yes  If diabetic, was a CBG obtained today?  No  Did the patient bring in their glucometer from home?  No  How often do you monitor your CBG's? Does not.   Financial Strains and Diabetes Management:  Are you having any financial strains with the device, your supplies or your medication? No .  Does the patient want to be seen by Chronic Care Management for management of their diabetes?  No  Would the patient like to be referred to a Nutritionist or for Diabetic Management?  No   Diabetic Exams:  Diabetic Eye Exam: Overdue for diabetic eye exam. Pt has been advised about the importance in completing this exam. Patient advised to call and schedule an eye exam. Diabetic Foot Exam: Overdue, Pt has been advised about the importance in completing this exam. Pt is scheduled for diabetic foot exam on next appointment.   Interpreter Needed?: No  Information entered by :: NAllen LPN   Activities of Daily Living In your present state of health, do you have any difficulty performing the following activities: 07/10/2021  Hearing? N  Vision? N  Difficulty concentrating or making decisions? N  Walking or climbing stairs? N   Dressing or bathing? N  Doing errands, shopping? N  Preparing Food and eating ? N  Using the Toilet? N  In the past six months, have you accidently leaked urine? Y  Do you have problems with loss of bowel control? N  Managing your Medications? N  Managing your Finances? N  Housekeeping or managing your Housekeeping? N  Some recent data might be hidden    Patient Care Team: Kristian Covey, MD as PCP - General  Indicate any recent Medical Services you may have received from other than [facility] providers in the past year (date may be approximate).     Assessment:   This is a routine wellness examination for Ivelisse.  Hearing/Vision screen Vision Screening - Comments:: No regular eye exams  Dietary issues and exercise activities discussed: Current Exercise Habits: The patient does not participate in regular exercise at  present   Goals Addressed             This Visit's Progress    Patient Stated       07/10/2021, wants to lose weight and get back to swimming       Depression Screen PHQ 2/9 Scores 07/10/2021 06/14/2020 04/21/2020 10/08/2017 08/07/2016 05/30/2015 11/02/2013  PHQ - 2 Score 0 0 0 0 0 0 0    Fall Risk Fall Risk  07/10/2021 06/14/2020 04/21/2020 10/08/2017 08/07/2016  Falls in the past year? 0 1 0 No No  Number falls in past yr: - 1 0 - -  Injury with Fall? - 1 0 - -  Comment - bruise knee and scratch hands - - -  Risk for fall due to : Medication side effect - - - -  Follow up Falls evaluation completed;Education provided;Falls prevention discussed - Falls evaluation completed - -    FALL RISK PREVENTION PERTAINING TO THE HOME:  Any stairs in or around the home? Yes  If so, are there any without handrails? No  Home free of loose throw rugs in walkways, pet beds, electrical cords, etc? Yes  Adequate lighting in your home to reduce risk of falls? Yes   ASSISTIVE DEVICES UTILIZED TO PREVENT FALLS:  Life alert? No  Use of a cane, walker or w/c? No  Grab bars in  the bathroom? Yes  Shower chair or bench in shower? No  Elevated toilet seat or a handicapped toilet? Yes   TIMED UP AND GO:  Was the test performed? No .      Cognitive Function:     6CIT Screen 07/10/2021 06/14/2020  What Year? 0 points 0 points  What month? 0 points 0 points  What time? 0 points -  Count back from 20 0 points 0 points  Months in reverse 0 points 0 points  Repeat phrase 0 points 0 points  Total Score 0 -    Immunizations Immunization History  Administered Date(s) Administered   Fluad Quad(high Dose 65+) 06/27/2020   Influenza, High Dose Seasonal PF 06/27/2015, 08/07/2016, 05/23/2017, 06/18/2018   Influenza,inj,Quad PF,6+ Mos 05/20/2013, 07/14/2014   Moderna Covid-19 Vaccine Bivalent Booster 67yrs & up 07/05/2021   Moderna Sars-Covid-2 Vaccination 10/01/2019, 10/29/2019, 07/08/2020, 12/21/2020   Pneumococcal Conjugate-13 06/27/2015   Pneumococcal Polysaccharide-23 03/22/2013   Tdap 03/22/2013   Zoster, Live 05/20/2013    TDAP status: Up to date  Flu Vaccine status: Due, Education has been provided regarding the importance of this vaccine. Advised may receive this vaccine at local pharmacy or Health Dept. Aware to provide a copy of the vaccination record if obtained from local pharmacy or Health Dept. Verbalized acceptance and understanding.  Pneumococcal vaccine status: Up to date  Covid-19 vaccine status: Completed vaccines  Qualifies for Shingles Vaccine? Yes   Zostavax completed Yes   Shingrix Completed?: No.    Education has been provided regarding the importance of this vaccine. Patient has been advised to call insurance company to determine out of pocket expense if they have not yet received this vaccine. Advised may also receive vaccine at local pharmacy or Health Dept. Verbalized acceptance and understanding.  Screening Tests Health Maintenance  Topic Date Due   OPHTHALMOLOGY EXAM  Never done   Hepatitis C Screening  Never done    COLONOSCOPY (Pts 45-25yrs Insurance coverage will need to be confirmed)  Never done   MAMMOGRAM  Never done   Zoster Vaccines- Shingrix (1 of 2) Never done   DEXA  SCAN  Never done   FOOT EXAM  08/19/2020   INFLUENZA VACCINE  04/02/2021   HEMOGLOBIN A1C  10/21/2021   TETANUS/TDAP  03/23/2023   Pneumonia Vaccine 69+ Years old  Completed   COVID-19 Vaccine  Completed   HPV VACCINES  Aged Out    Health Maintenance  Health Maintenance Due  Topic Date Due   OPHTHALMOLOGY EXAM  Never done   Hepatitis C Screening  Never done   COLONOSCOPY (Pts 45-73yrs Insurance coverage will need to be confirmed)  Never done   MAMMOGRAM  Never done   Zoster Vaccines- Shingrix (1 of 2) Never done   DEXA SCAN  Never done   FOOT EXAM  08/19/2020   INFLUENZA VACCINE  04/02/2021    Colorectal cancer screening: Referral to GI placed today. Pt aware the office will call re: appt.  Mammogram status: Ordered today. Pt provided with contact info and advised to call to schedule appt.   Bone Density status: Ordered today. Pt provided with contact info and advised to call to schedule appt.  Lung Cancer Screening: (Low Dose CT Chest recommended if Age 64-80 years, 30 pack-year currently smoking OR have quit w/in 15years.) does not qualify.   Lung Cancer Screening Referral: no  Additional Screening:  Hepatitis C Screening: does qualify;   Vision Screening: Recommended annual ophthalmology exams for early detection of glaucoma and other disorders of the eye. Is the patient up to date with their annual eye exam?  No  Who is the provider or what is the name of the office in which the patient attends annual eye exams? none If pt is not established with a provider, would they like to be referred to a provider to establish care? No .   Dental Screening: Recommended annual dental exams for proper oral hygiene  Community Resource Referral / Chronic Care Management: CRR required this visit?  No   CCM required  this visit?  No      Plan:     I have personally reviewed and noted the following in the patient's chart:   Medical and social history Use of alcohol, tobacco or illicit drugs  Current medications and supplements including opioid prescriptions.  Functional ability and status Nutritional status Physical activity Advanced directives List of other physicians Hospitalizations, surgeries, and ER visits in previous 12 months Vitals Screenings to include cognitive, depression, and falls Referrals and appointments  In addition, I have reviewed and discussed with patient certain preventive protocols, quality metrics, and best practice recommendations. A written personalized care plan for preventive services as well as general preventive health recommendations were provided to patient.     Barb Merino, LPN   [id number]   Nurse Notes: none

## 2021-07-10 NOTE — Patient Instructions (Signed)
Joanne Mendoza , Thank you for taking time to come for your Medicare Wellness Visit. I appreciate your ongoing commitment to your health goals. Please review the following plan we discussed and let me know if I can assist you in the future.   Screening recommendations/referrals: Colonoscopy: ordered today Mammogram: ordered today Bone Density: ordered today Recommended yearly ophthalmology/optometry visit for glaucoma screening and checkup Recommended yearly dental visit for hygiene and checkup  Vaccinations: Influenza vaccine: due Pneumococcal vaccine: completed 06/27/2015 Tdap vaccine: completed 03/22/2013, due 03/23/2023 Shingles vaccine: discussed   Covid-19: 07/05/2021, 12/21/2020, 07/08/2020, 10/29/2019,10/01/2019  Advanced directives: Advance directive discussed with you today.   Conditions/risks identified: none  Next appointment: Follow up in one year for your annual wellness visit    Preventive Care 65 Years and Older, Female Preventive care refers to lifestyle choices and visits with your health care provider that can promote health and wellness. What does preventive care include? A yearly physical exam. This is also called an annual well check. Dental exams once or twice a year. Routine eye exams. Ask your health care provider how often you should have your eyes checked. Personal lifestyle choices, including: Daily care of your teeth and gums. Regular physical activity. Eating a healthy diet. Avoiding tobacco and drug use. Limiting alcohol use. Practicing safe sex. Taking low-dose aspirin every day. Taking vitamin and mineral supplements as recommended by your health care provider. What happens during an annual well check? The services and screenings done by your health care provider during your annual well check will depend on your age, overall health, lifestyle risk factors, and family history of disease. Counseling  Your health care provider may ask you questions about  your: Alcohol use. Tobacco use. Drug use. Emotional well-being. Home and relationship well-being. Sexual activity. Eating habits. History of falls. Memory and ability to understand (cognition). Work and work Astronomer. Reproductive health. Screening  You may have the following tests or measurements: Height, weight, and BMI. Blood pressure. Lipid and cholesterol levels. These may be checked every 5 years, or more frequently if you are over 58 years old. Skin check. Lung cancer screening. You may have this screening every year starting at age 52 if you have a 30-pack-year history of smoking and currently smoke or have quit within the past 15 years. Fecal occult blood test (FOBT) of the stool. You may have this test every year starting at age 6. Flexible sigmoidoscopy or colonoscopy. You may have a sigmoidoscopy every 5 years or a colonoscopy every 10 years starting at age 68. Hepatitis C blood test. Hepatitis B blood test. Sexually transmitted disease (STD) testing. Diabetes screening. This is done by checking your blood sugar (glucose) after you have not eaten for a while (fasting). You may have this done every 1-3 years. Bone density scan. This is done to screen for osteoporosis. You may have this done starting at age 30. Mammogram. This may be done every 1-2 years. Talk to your health care provider about how often you should have regular mammograms. Talk with your health care provider about your test results, treatment options, and if necessary, the need for more tests. Vaccines  Your health care provider may recommend certain vaccines, such as: Influenza vaccine. This is recommended every year. Tetanus, diphtheria, and acellular pertussis (Tdap, Td) vaccine. You may need a Td booster every 10 years. Zoster vaccine. You may need this after age 22. Pneumococcal 13-valent conjugate (PCV13) vaccine. One dose is recommended after age 32. Pneumococcal polysaccharide (PPSV23) vaccine.  One  dose is recommended after age 60. Talk to your health care provider about which screenings and vaccines you need and how often you need them. This information is not intended to replace advice given to you by your health care provider. Make sure you discuss any questions you have with your health care provider. Document Released: 09/15/2015 Document Revised: 05/08/2016 Document Reviewed: 06/20/2015 Elsevier Interactive Patient Education  2017 [place] Prevention in the Home Falls can cause injuries. They can happen to people of all ages. There are many things you can do to make your home safe and to help prevent falls. What can I do on the outside of my home? Regularly fix the edges of walkways and driveways and fix any cracks. Remove anything that might make you trip as you walk through a door, such as a raised step or threshold. Trim any bushes or trees on the path to your home. Use bright outdoor lighting. Clear any walking paths of anything that might make someone trip, such as rocks or tools. Regularly check to see if handrails are loose or broken. Make sure that both sides of any steps have handrails. Any raised decks and porches should have guardrails on the edges. Have any leaves, snow, or ice cleared regularly. Use sand or salt on walking paths during winter. Clean up any spills in your garage right away. This includes oil or grease spills. What can I do in the bathroom? Use night lights. Install grab bars by the toilet and in the tub and shower. Do not use towel bars as grab bars. Use non-skid mats or decals in the tub or shower. If you need to sit down in the shower, use a plastic, non-slip stool. Keep the floor dry. Clean up any water that spills on the floor as soon as it happens. Remove soap buildup in the tub or shower regularly. Attach bath mats securely with double-sided non-slip rug tape. Do not have throw rugs and other things on the floor that can make  you trip. What can I do in the bedroom? Use night lights. Make sure that you have a light by your bed that is easy to reach. Do not use any sheets or blankets that are too big for your bed. They should not hang down onto the floor. Have a firm chair that has side arms. You can use this for support while you get dressed. Do not have throw rugs and other things on the floor that can make you trip. What can I do in the kitchen? Clean up any spills right away. Avoid walking on wet floors. Keep items that you use a lot in easy-to-reach places. If you need to reach something above you, use a strong step stool that has a grab bar. Keep electrical cords out of the way. Do not use floor polish or wax that makes floors slippery. If you must use wax, use non-skid floor wax. Do not have throw rugs and other things on the floor that can make you trip. What can I do with my stairs? Do not leave any items on the stairs. Make sure that there are handrails on both sides of the stairs and use them. Fix handrails that are broken or loose. Make sure that handrails are as long as the stairways. Check any carpeting to make sure that it is firmly attached to the stairs. Fix any carpet that is loose or worn. Avoid having throw rugs at the top or bottom of the stairs.  If you do have throw rugs, attach them to the floor with carpet tape. Make sure that you have a light switch at the top of the stairs and the bottom of the stairs. If you do not have them, ask someone to add them for you. What else can I do to help prevent falls? Wear shoes that: Do not have high heels. Have rubber bottoms. Are comfortable and fit you well. Are closed at the toe. Do not wear sandals. If you use a stepladder: Make sure that it is fully opened. Do not climb a closed stepladder. Make sure that both sides of the stepladder are locked into place. Ask someone to hold it for you, if possible. Clearly mark and make sure that you can  see: Any grab bars or handrails. First and last steps. Where the edge of each step is. Use tools that help you move around (mobility aids) if they are needed. These include: Canes. Walkers. Scooters. Crutches. Turn on the lights when you go into a dark area. Replace any light bulbs as soon as they burn out. Set up your furniture so you have a clear path. Avoid moving your furniture around. If any of your floors are uneven, fix them. If there are any pets around you, be aware of where they are. Review your medicines with your doctor. Some medicines can make you feel dizzy. This can increase your chance of falling. Ask your doctor what other things that you can do to help prevent falls. This information is not intended to replace advice given to you by your health care provider. Make sure you discuss any questions you have with your health care provider. Document Released: 06/15/2009 Document Revised: 01/25/2016 Document Reviewed: 09/23/2014 Elsevier Interactive Patient Education  2017 [employer].

## 2021-09-08 ENCOUNTER — Other Ambulatory Visit: Payer: Self-pay | Admitting: Family Medicine

## 2021-10-05 ENCOUNTER — Other Ambulatory Visit: Payer: Self-pay | Admitting: Family Medicine

## 2021-10-11 ENCOUNTER — Encounter: Payer: Self-pay | Admitting: Family Medicine

## 2021-11-26 ENCOUNTER — Other Ambulatory Visit: Payer: Self-pay | Admitting: Family Medicine

## 2022-01-29 ENCOUNTER — Telehealth: Payer: Self-pay | Admitting: Family Medicine

## 2022-01-29 ENCOUNTER — Encounter: Payer: Self-pay | Admitting: Family Medicine

## 2022-01-29 ENCOUNTER — Ambulatory Visit: Payer: Medicare PPO | Admitting: Family Medicine

## 2022-01-29 VITALS — BP 140/76 | HR 82 | Temp 97.7°F | Ht 65.5 in | Wt 226.0 lb

## 2022-01-29 DIAGNOSIS — L603 Nail dystrophy: Secondary | ICD-10-CM | POA: Diagnosis not present

## 2022-01-29 DIAGNOSIS — R2689 Other abnormalities of gait and mobility: Secondary | ICD-10-CM | POA: Diagnosis not present

## 2022-01-29 DIAGNOSIS — I10 Essential (primary) hypertension: Secondary | ICD-10-CM | POA: Diagnosis not present

## 2022-01-29 DIAGNOSIS — E1165 Type 2 diabetes mellitus with hyperglycemia: Secondary | ICD-10-CM

## 2022-01-29 LAB — POCT GLYCOSYLATED HEMOGLOBIN (HGB A1C): Hemoglobin A1C: 6.3 % — AB (ref 4.0–5.6)

## 2022-01-29 NOTE — Telephone Encounter (Signed)
Spoke with patient and answered her questions.  She had questions regarding whether to get a 5th COVID-vaccine

## 2022-01-29 NOTE — Progress Notes (Signed)
Established Patient Office Visit  Subjective   Patient ID: Joanne Mendoza, female    DOB: 02/13/1946  Age: 76 y.o. MRN: [id number]  Chief Complaint  Patient presents with   Follow-up    HPI   Here for medical follow-up.  She has hypertension treated with amlodipine, metoprolol, losartan HCTZ.  She takes atorvastatin for hyperlipidemia and metformin for type 2 diabetes.  She continues to deal with stress issues of taking care of her 62 year old mother.  Her mother is currently at home.  She has had some balance issues but no recent falls.  She hopes to get back to doing regular swimming soon which she thinks may help.  No history of peripheral neuropathy.  She has had some thickened dystrophic changes of several toenails and thinks she may have toenail fungus issues.  She is not sure she would wish to treat at this time.  Diabetes been well controlled.  Does not monitor regularly at home.  She has managed to lose 5 pounds since last visit.  Her blood pressures been challenging to manage.  She currently does not have home monitor.  Past Medical History:  Diagnosis Date   Hyperlipidemia    Hypertension    Past Surgical History:  Procedure Laterality Date   broken arm     CESAREAN SECTION     x 2     reports that she has never smoked. She has never used smokeless tobacco. She reports that she does not drink alcohol and does not use drugs. family history includes Alcohol abuse in her father; Colon cancer in her paternal uncle; Hyperlipidemia in an other family member; Hypertension in her mother; Pancreatic cancer in her paternal uncle. Allergies  Allergen Reactions   Penicillins     REACTION: hives    Review of Systems  Respiratory:  Negative for shortness of breath.   Cardiovascular:  Negative for chest pain.  Gastrointestinal:  Negative for abdominal pain.  Genitourinary:  Negative for dysuria.  Neurological:  Negative for dizziness.     Objective:     BP 140/76 (BP  Location: Left Arm, Patient Position: Sitting, Cuff Size: Normal)   Pulse 82   Temp 97.7 F (36.5 C) (Oral)   Ht 5' 5.5" (1.664 m)   Wt 226 lb (102.5 kg)   SpO2 98%   BMI 37.04 kg/m  BP Readings from Last 3 Encounters:  01/29/22 140/76  04/20/21 (!) 150/74  04/21/20 (!) 144/82   Wt Readings from Last 3 Encounters:  01/29/22 226 lb (102.5 kg)  07/10/21 230 lb (104.3 kg)  04/20/21 231 lb 14.4 oz (105.2 kg)         Results for orders placed or performed in visit on 01/29/22  POCT glycosylated hemoglobin (Hb A1C)  Result Value Ref Range   Hemoglobin A1C 6.3 (A) 4.0 - 5.6 %   HbA1c POC (<> result, manual entry)     HbA1c, POC (prediabetic range)     HbA1c, POC (controlled diabetic range)        The 10-year ASCVD risk score (Arnett DK, et al., 2019) is: 37.1%    Assessment & Plan:   #1 type 2 diabetes stable with A1c 6.3%.  Continue yearly eye exam.  Check urine microalbumin with next labs  #2 hypertension.  Slightly elevated today.  Suspect element of whitecoat syndrome.  We strongly encouraged her to get home cuff and monitor regularly be in touch if consistently greater than 140/90 Continue weight loss efforts  #3  probable onychomycosis several toenails.  Discussed options including Lamisil.  At this point she wishes to wait  #4 balance issues.  Likely multifactorial.  Offered physical therapy referral but she declines this time.  She hopes to get back some regular swimming and see how that goes first   Return in about 6 months (around 08/01/2022).    Evelena Peat, MD

## 2022-01-29 NOTE — Telephone Encounter (Signed)
Pt just left appointment and has questions she forgot to ask while here,. Requests a call.

## 2022-01-29 NOTE — Patient Instructions (Signed)
Monitor blood pressure and be in touch if consistently > 140/90.   Consider [employer] BP monitor.

## 2022-03-02 ENCOUNTER — Other Ambulatory Visit: Payer: Self-pay | Admitting: Family Medicine

## 2022-04-01 ENCOUNTER — Other Ambulatory Visit: Payer: Self-pay | Admitting: Family Medicine

## 2022-04-01 DIAGNOSIS — I10 Essential (primary) hypertension: Secondary | ICD-10-CM

## 2022-06-01 ENCOUNTER — Other Ambulatory Visit: Payer: Self-pay | Admitting: Family Medicine

## 2022-06-07 ENCOUNTER — Ambulatory Visit: Payer: Medicare PPO | Admitting: Family Medicine

## 2022-06-07 ENCOUNTER — Encounter: Payer: Self-pay | Admitting: Family Medicine

## 2022-06-07 VITALS — BP 140/70 | HR 80 | Temp 97.7°F | Ht 65.5 in | Wt 226.8 lb

## 2022-06-07 DIAGNOSIS — J019 Acute sinusitis, unspecified: Secondary | ICD-10-CM

## 2022-06-07 MED ORDER — CEFDINIR 300 MG PO CAPS: 300.0000 mg | ORAL_CAPSULE | Freq: Two times a day (BID) | ORAL | 0 refills | Status: DC

## 2022-06-07 NOTE — Progress Notes (Signed)
Established Patient Office Visit  Subjective   Patient ID: Joanne Mendoza, female    DOB: 1946/08/08  Age: 76 y.o. MRN: [id number]  Chief Complaint  Patient presents with   Sinusitis    Patient complains of sinusitis, x2 weeks ago   Cough    Patient complains of cough, Non productive,     HPI   Joanne Mendoza is seen with concern for sinusitis symptoms.  She was out in [place] visiting her son and grandchildren couple weeks ago when she developed a typical head cold.  She now feels like she is having worsening symptoms with some chills, headache, facial pain, and right earache.  Increased malaise.  Also has developed some dry cough.  She states she has not had a sinusitis in several years.  She has reported allergy to penicillin in childhood but has tolerated cephalosporins previously.  Past Medical History:  Diagnosis Date   Hyperlipidemia    Hypertension    Past Surgical History:  Procedure Laterality Date   broken arm     CESAREAN SECTION     x 2     reports that she has never smoked. She has never used smokeless tobacco. She reports that she does not drink alcohol and does not use drugs. family history includes Alcohol abuse in her father; Colon cancer in her paternal uncle; Hyperlipidemia in an other family member; Hypertension in her mother; Pancreatic cancer in her paternal uncle. Allergies  Allergen Reactions   Penicillins     REACTION: hives    Review of Systems  Constitutional:  Positive for chills and malaise/fatigue. Negative for fever.  HENT:  Positive for congestion, ear pain and sinus pain.   Respiratory:  Positive for cough. Negative for shortness of breath.   Cardiovascular:  Negative for chest pain.  Neurological:  Positive for headaches.      Objective:     BP (!) 140/70 (BP Location: Left Arm, Patient Position: Sitting, Cuff Size: Large)   Pulse 80   Temp 97.7 F (36.5 C) (Oral)   Ht 5' 5.5" (1.664 m)   Wt 226 lb 12.8 oz (102.9 kg)   SpO2 98%    BMI 37.17 kg/m  BP Readings from Last 3 Encounters:  06/07/22 (!) 140/70  01/29/22 140/76  04/20/21 (!) 150/74   Wt Readings from Last 3 Encounters:  06/07/22 226 lb 12.8 oz (102.9 kg)  01/29/22 226 lb (102.5 kg)  07/10/21 230 lb (104.3 kg)      Physical Exam Vitals reviewed.  Constitutional:      General: She is not in acute distress.    Appearance: She is not ill-appearing.  HENT:     Right Ear: Tympanic membrane normal.     Left Ear: Tympanic membrane normal.     Mouth/Throat:     Mouth: Mucous membranes are moist.     Pharynx: Oropharynx is clear.  Cardiovascular:     Rate and Rhythm: Normal rate and regular rhythm.  Pulmonary:     Effort: Pulmonary effort is normal.     Breath sounds: Normal breath sounds. No wheezing or rales.  Musculoskeletal:     Cervical back: Neck supple.  Lymphadenopathy:     Cervical: No cervical adenopathy.  Neurological:     Mental Status: She is alert.      No results found for any visits on 06/07/22.    The 10-year ASCVD risk score (Arnett DK, et al., 2019) is: 37.1%    Assessment & Plan:  Acute sinusitis.  Given duration of symptoms go ahead and start antibiotics with Omnicef 300 mg twice daily for 10 days.  Consider concomitant use of probiotic. -Follow-up for any persistent or worsening symptoms  No follow-ups on file.    Evelena Peat, MD

## 2022-07-02 ENCOUNTER — Other Ambulatory Visit: Payer: Self-pay | Admitting: Family Medicine

## 2022-07-02 DIAGNOSIS — I10 Essential (primary) hypertension: Secondary | ICD-10-CM

## 2022-07-05 ENCOUNTER — Ambulatory Visit: Payer: Medicare PPO

## 2022-07-08 ENCOUNTER — Telehealth: Payer: Self-pay

## 2022-07-08 NOTE — Telephone Encounter (Signed)
Caller states she has a productive cough, mild sore throat, sinus pain and earache when she bends over  07/08/2022 8:42:26 AM See PCP within 24 Hours Conner, RN, Lesa  Referrals Warm transfer to backline  Pt has appt with PCP on 07/09/22

## 2022-07-09 ENCOUNTER — Ambulatory Visit: Payer: Medicare PPO | Admitting: Family Medicine

## 2022-07-09 VITALS — BP 138/72 | HR 80 | Temp 97.6°F | Ht 65.5 in | Wt 228.1 lb

## 2022-07-09 DIAGNOSIS — J329 Chronic sinusitis, unspecified: Secondary | ICD-10-CM

## 2022-07-09 MED ORDER — AZITHROMYCIN 250 MG PO TABS: ORAL_TABLET | ORAL | 0 refills | Status: AC

## 2022-07-09 NOTE — Progress Notes (Signed)
   Established Patient Office Visit  Subjective   Patient ID: Joanne Mendoza, female    DOB: Jul 27, 1946  Age: 76 y.o. MRN: [id number]  Chief Complaint  Patient presents with   Sinusitis    HPI   Joanne Mendoza is seen with recurrent sinusitis type symptoms.  She was seen here on 6 October and at that point had couple weeks of increasing facial pain and upper teeth pain.  She was treated with Omnicef for 10 days and symptoms did eventually improve.  Now has recurrence of very similar symptoms.  No fever.  Home COVID test negative.  Having some recurrent upper teeth pain and facial pain and occasional headaches.  Past Medical History:  Diagnosis Date   Hyperlipidemia    Hypertension    Past Surgical History:  Procedure Laterality Date   broken arm     CESAREAN SECTION     x 2     reports that she has never smoked. She has never used smokeless tobacco. She reports that she does not drink alcohol and does not use drugs. family history includes Alcohol abuse in her father; Colon cancer in her paternal uncle; Hyperlipidemia in an other family member; Hypertension in her mother; Pancreatic cancer in her paternal uncle. Allergies  Allergen Reactions   Penicillins     REACTION: hives    Review of Systems  Constitutional:  Positive for malaise/fatigue. Negative for chills and fever.  HENT:  Positive for sinus pain.   Respiratory:  Positive for cough.   Cardiovascular:  Negative for chest pain.      Objective:     BP 138/72 (BP Location: Left Arm, Patient Position: Sitting, Cuff Size: Large)   Pulse 80   Temp 97.6 F (36.4 C) (Oral)   Ht 5' 5.5" (1.664 m)   Wt 228 lb 1.6 oz (103.5 kg)   SpO2 98%   BMI 37.38 kg/m  BP Readings from Last 3 Encounters:  07/09/22 138/72  06/07/22 (!) 140/70  01/29/22 140/76   Wt Readings from Last 3 Encounters:  07/09/22 228 lb 1.6 oz (103.5 kg)  06/07/22 226 lb 12.8 oz (102.9 kg)  01/29/22 226 lb (102.5 kg)      Physical Exam Vitals  reviewed.  Constitutional:      General: She is not in acute distress.    Appearance: She is not ill-appearing or toxic-appearing.  Pulmonary:     Effort: Pulmonary effort is normal.     Breath sounds: No wheezing or rales.      No results found for any visits on 07/09/22.    The 10-year ASCVD risk score (Arnett DK, et al., 2019) is: 36.3%    Assessment & Plan:   Recurrent sinusitis symptoms.  Patient relating maxillary facial pain and upper teeth pain.  She states she is always responded extremely well to Zithromax in the past.  We did discuss increasing resistance patterns to Zithromax.  However, we agreed to a trial of Zithromax that she has done well in the past but be in touch if not improving over the next week -Also discussed conservative measures with plenty fluids and topical heat and consider over-the-counter plain Mucinex to loosen up mucus  No follow-ups on file.    Carolann Littler, MD

## 2022-08-06 ENCOUNTER — Ambulatory Visit (INDEPENDENT_AMBULATORY_CARE_PROVIDER_SITE_OTHER): Payer: Medicare PPO

## 2022-08-06 ENCOUNTER — Ambulatory Visit: Payer: Medicare PPO

## 2022-08-06 DIAGNOSIS — Z23 Encounter for immunization: Secondary | ICD-10-CM | POA: Diagnosis not present

## 2022-08-09 ENCOUNTER — Ambulatory Visit: Payer: Medicare PPO | Admitting: Family Medicine

## 2022-08-23 ENCOUNTER — Ambulatory Visit: Payer: Medicare PPO

## 2022-08-26 ENCOUNTER — Other Ambulatory Visit: Payer: Self-pay | Admitting: Family Medicine

## 2022-08-30 ENCOUNTER — Ambulatory Visit: Payer: Medicare PPO

## 2022-09-02 ENCOUNTER — Other Ambulatory Visit: Payer: Self-pay | Admitting: Family Medicine

## 2022-09-11 ENCOUNTER — Other Ambulatory Visit: Payer: Self-pay | Admitting: Family Medicine

## 2022-09-17 ENCOUNTER — Telehealth (INDEPENDENT_AMBULATORY_CARE_PROVIDER_SITE_OTHER): Payer: Medicare PPO | Admitting: Family Medicine

## 2022-09-17 VITALS — Ht 65.5 in | Wt 228.0 lb

## 2022-09-17 DIAGNOSIS — Z0000 Encounter for general adult medical examination without abnormal findings: Secondary | ICD-10-CM

## 2022-09-17 NOTE — Patient Instructions (Addendum)
I really enjoyed getting to talk with you today! I am available on Tuesdays and Thursdays for virtual visits if you have any questions or concerns, or if I can be of any further assistance.   CHECKLIST FROM ANNUAL WELLNESS VISIT:  -Follow up (please call to schedule if not scheduled after visit):  -Inperson visit with your Primary Doctor office: A As scheduled later this week and please inquire about:    Cologuard for colon cancer screening    Mammogram and DEXA    Diabetes lab test and kidney test    Diabetic foot exam    Hepatitis C screening  -yearly for annual wellness visit with primary care office  Please schedule your diabetic eye exam with your eye doctor.   Here is a list of your preventive care/health maintenance measures and the plan for each if any are due:  Health Maintenance  Topic Date Due   OPHTHALMOLOGY EXAM  Never done   Diabetic kidney evaluation - Urine ACR  Never done   Hepatitis C Screening  Never done   COLONOSCOPY (Pts 45-35yrs Insurance coverage will need to be confirmed)  Never done   MAMMOGRAM  Never done   Zoster Vaccines- Shingrix (1 of 2) Never done   DEXA SCAN  Never done   FOOT EXAM  08/19/2020   Diabetic kidney evaluation - eGFR measurement  04/20/2022       Medicare Annual Wellness (AWV)  Done today   HEMOGLOBIN A1C  08/01/2022   DTaP/Tdap/Td (2 - Td or Tdap) 03/23/2023   Pneumonia Vaccine 72+ Years old  Completed   INFLUENZA VACCINE   COVID-19 Vaccine (6 - 2023-24 season) Completed, Due yearly   Completed   HPV VACCINES  Aged Out    -See a dentist at least yearly  -Get your eyes checked and then per your eye specialist's recommendations  -Other issues addressed today:   -I have included below further information regarding a healthy whole foods based diet, physical activity guidelines for adults, stress management and opportunities for social connections. I hope you find this information useful.    -----------------------------------------------------------------------------------------------------------------------------------------------------------------------------------------------------------------------------------------------------------  NUTRITION: -eat real food: lots of colorful vegetables (half the plate) and fruits -5-7 servings of vegetables and fruits per day (fresh or steamed is best), exp. 2 servings of vegetables with lunch and dinner and 2 servings of fruit per day. Berries and greens such as kale and collards are great choices.  -consume on a regular basis: whole grains (make sure first ingredient on label contains the word "whole"), fresh fruits, fish, nuts, seeds, healthy oils (such as olive oil, avocado oil, grape seed oil) -may eat small amounts of dairy and lean meat on occasion, but avoid processed meats such as ham, bacon, lunch meat, etc. -drink water -try to avoid fast food and pre-packaged foods, processed meat -most experts advise limiting sodium to < 2300mg  per day, should limit further is any chronic conditions such as high blood pressure, heart disease, diabetes, etc. The American Heart Association advised that < 1500mg  is is ideal -try to avoid foods that contain any ingredients with names you do not recognize  -try to avoid sugar/sweets (except for the natural sugar that occurs in fresh fruit) -try to avoid sweet drinks -try to avoid white rice, white bread, pasta (unless whole grain), white or yellow potatoes  EXERCISE GUIDELINES FOR ADULTS: -if you wish to increase your physical activity, do so gradually and with the approval of your doctor -STOP and seek medical care immediately if  you have any chest pain, chest discomfort or trouble breathing when starting or increasing exercise  -move and stretch your body, legs, feet and arms when sitting for long periods -Physical activity guidelines for optimal health in adults: -least 150 minutes per week of  aerobic exercise (can talk, but not sing) once approved by your doctor, 20-30 minutes of sustained activity or two 10 minute episodes of sustained activity every day.  -resistance training at least 2 days per week if approved by your doctor -balance exercises 3+ days per week:   Stand somewhere where you have something sturdy to hold onto if you lose balance.    1) lift up on toes, start with 5x per day and work up to 20x   2) stand and lift on leg straight out to the side so that foot is a few inches of the floor, start with 5x each side and work up to 20x each side   3) stand on one foot, start with 5 seconds each side and work up to 20 seconds on each side  If you need ideas or help with getting more active:  -Silver sneakers https://tools.silversneakers.com  -Walk with a Doc: http://stephens-thompson.biz/  -try to include resistance (weight lifting/strength building) and balance exercises twice per week: or the following link for ideas: ChessContest.fr  UpdateClothing.com.cy  STRESS MANAGEMENT: -can try meditating, or just sitting quietly with deep breathing while intentionally relaxing all parts of your body for 5 minutes daily -if you need further help with stress, anxiety or depression please follow up with your primary doctor or contact the wonderful folks at [place]: [place]: -options in [place] if you wish to engage in more social and exercise related activities:  -Silver sneakers https://tools.silversneakers.com  -Walk with a Doc: http://stephens-thompson.biz/  -Check out the [place] 50+ section on the [employer] (hiking clubs, book clubs, cards and games, chess, exercise classes, aquatic classes and much more) - see the website for  details: https://www.[place].gov/departments/parks-recreation/active-adults50  -YouTube has lots of exercise videos for different ages and abilities as well  -[place] (a variety of indoor and outdoor inperson activities for adults). (757) 847-9601. [address]  -Virtual Online Classes (a variety of topics): see seniorplanet.org or call [phone]

## 2022-09-17 NOTE — Progress Notes (Signed)
PATIENT CHECK-IN and HEALTH RISK ASSESSMENT QUESTIONNAIRE:  -completed by phone/video for upcoming Medicare Preventive Visit  Pre-Visit Check-in: 1)Vitals (height, wt, BP, etc) - record in vitals section for visit on day of visit 2)Review and Update Medications, Allergies PMH, Surgeries, Social history in Epic 3)Hospitalizations in the last year with date/reason? no  4)Review and Update Care Team (patient's specialists) in Epic 5) Complete PHQ9 in Epic  6) Complete Fall Screening in Epic 7)Review all Health Maintenance Due and order under PCP if not done.  8)Medicare Wellness Questionnaire: Answer theses question about your habits: Do you drink alcohol? Yes 2 glasses of wine a year.  If yes, how many drinks do you have a day? Have you ever smoked?no  Quit date if applicable? N/a  How many packs a day do/did you smoke? N/a Do you use smokeless tobacco?n/a Do you use an illicit drugs?n/a Do you exercises? No, she use to swim...but has not been swimming recently as is staying with her mother and has not had as much time. Knows needs to get back to it. She is very active doing housework and helping her mother though. She plans to get back to swimming.  Eats mostly at home, feels better when she eats healthy, eats more frequent small meals, avoids beef - eats rarely, she tries to avoid white grains - though likes rice.  Typical breakfast: usually banana, low fat cheese or yogurt Typical lunch: salad, soup, bowl of fruit.  Typical dinner: protein,- sometimes fish, chickens vegetable/fruit with it.   Beverages: water, unsweeten tea, flavor water.   Answer theses question about you: Can you perform most household chores?yes Do you find it hard to follow a conversation in a noisy room? no Do you often ask people to speak up or repeat themselves?no Do you feel that you have a problem with memory?no Do you balance your checkbook and or bank acounts?yes Do you feel safe at home?yes Last dentist  visit?Last dentist visit was last August with Dr. Val Riles Do you need assistance with any of the following: Please note if so   Driving? No  Feeding yourself? No  Getting from bed to chair? No  Getting to the toilet? No  Bathing or showering? No  Dressing yourself? No  Managing money? No  Climbing a flight of stairs No  Preparing meals? No  Do you have Advanced Directives in place (Living Will, Healthcare Power or Attorney)? Yes   Last eye Exam and location?unable to recall when she last had eye exam   Do you currently use prescribed or non-prescribed narcotic or opioid pain medications? no  Do you have a history or close family history of breast, ovarian, tubal or peritoneal cancer or a family member with BRCA (breast cancer susceptibility 1 and 2) gene mutations? Sister had breast cancer, paternal uncle with lung cancer, another paternal uncle had pancreatis.   Nurse/Assistant Credentials/time stamp: Karpuih M./CMA/12:47pm   ----------------------------------------------------------------------------------------------------------------------------------------------------------------------------------------------------------------------   MEDICARE ANNUAL PREVENTIVE VISIT WITH PROVIDER: (Welcome to Medicare, initial annual wellness or annual wellness exam)  Virtual Visit via Phone Note  I connected with Dalary  on 09/17/22 by telephone and verified that I am speaking with the correct person using two identifiers.  Location patient: home Location provider:work or home office Persons participating in the virtual visit: patient, provider  Concerns and/or follow up today: none. Seeing her PCP later this week for routine follow up per her report. Has noticed over time some balance    See HM section in Epic  for other details of completed HM.    ROS: negative for report of fevers, unintentional weight loss, vision changes, vision loss, hearing loss or change, chest pain, sob,  hemoptysis, melena, hematochezia, hematuria, genital discharge or lesions, falls, bleeding or bruising, loc, thoughts of suicide or self harm, memory loss  Patient-completed extensive health risk assessment - reviewed and discussed with the patient: See Health Risk Assessment completed with patient prior to the visit either above or in recent phone note. This was reviewed in detailed with the patient today and appropriate recommendations, orders and referrals were placed as needed per Summary below and patient instructions.   Review of Medical History: -PMH, PSH, Family History and current specialty and care providers reviewed and updated and listed below   Patient Care Team: Eulas Post, MD as PCP - General   Past Medical History:  Diagnosis Date   Hyperlipidemia    Hypertension     Past Surgical History:  Procedure Laterality Date   broken arm     CESAREAN SECTION     x 2     Social History   Socioeconomic History   Marital status: Divorced    Spouse name: Not on file   Number of children: Not on file   Years of education: Not on file   Highest education level: Not on file  Occupational History   Occupation: retired  Tobacco Use   Smoking status: Never   Smokeless tobacco: Never  Vaping Use   Vaping Use: Never used  Substance and Sexual Activity   Alcohol use: No    Comment: 4 times a year wine   Drug use: No   Sexual activity: Not on file  Other Topics Concern   Not on file  Social History Narrative   Not on file   Social Determinants of Health   Financial Resource Strain: [place]  (07/10/2021)   Overall Financial Resource Strain (CARDIA)    Difficulty of Paying Living Expenses: Not hard at all  Food Insecurity: No Food Insecurity (07/10/2021)   Hunger Vital Sign    Worried About Running Out of Food in the Last Year: Never true    Tower in the Last Year: Never true  Transportation Needs: No Transportation Needs (07/10/2021)   PRAPARE -  Hydrologist (Medical): No    Lack of Transportation (Non-Medical): No  Physical Activity: Inactive (07/10/2021)   Exercise Vital Sign    Days of Exercise per Week: 0 days    Minutes of Exercise per Session: 0 min  Stress: No Stress Concern Present (07/10/2021)   [place]    Feeling of Stress : Not at all  Social Connections: Moderately Integrated (06/14/2020)   Social Connection and Isolation Panel [NHANES]    Frequency of Communication with Friends and Family: More than three times a week    Frequency of Social Gatherings with Friends and Family: More than three times a week    Attends Religious Services: More than 4 times per year    Active Member of [employer] or Organizations: Yes    Attends Archivist Meetings: 1 to 4 times per year    Marital Status: Divorced  Intimate Partner Violence: Not At Risk (06/14/2020)   Humiliation, Afraid, Rape, and Kick questionnaire    Fear of Current or Ex-Partner: No    Emotionally Abused: No    Physically Abused: No    Sexually  Abused: No    Family History  Problem Relation Age of Onset   Alcohol abuse Father    Hyperlipidemia Other        Grandparent    Hypertension Mother    Colon cancer Paternal Uncle    Pancreatic cancer Paternal Uncle     Current Outpatient Medications on File Prior to Visit  Medication Sig Dispense Refill   amLODipine (NORVASC) 5 MG tablet TAKE 1 TABLET BY MOUTH EVERY DAY 90 tablet 1   atorvastatin (LIPITOR) 40 MG tablet TAKE 1 TABLET(40 MG) BY MOUTH DAILY 90 tablet 1   losartan-hydrochlorothiazide (HYZAAR) 100-12.5 MG tablet TAKE 1 TABLET BY MOUTH EVERY DAY 90 tablet 0   metFORMIN (GLUCOPHAGE) 500 MG tablet TAKE 2 TABLETS BY MOUTH TWICE DAILY 360 tablet 0   metoprolol succinate (TOPROL-XL) 50 MG 24 hr tablet TAKE 1 TABLET BY MOUTH EVERY DAY WITH OR IMMEDIATELY FOLLOWING A MEAL 90 tablet 1   PREVIDENT 5000 PLUS 1.1  % CREA dental cream Take by mouth.     cefdinir (OMNICEF) 300 MG capsule Take 1 capsule (300 mg total) by mouth 2 (two) times daily. (Patient not taking: Reported on 09/17/2022) 20 capsule 0   No current facility-administered medications on file prior to visit.    Allergies  Allergen Reactions   Penicillins     REACTION: hives       Physical Exam There were no vitals filed for this visit. Estimated body mass index is 37.36 kg/m as calculated from the following:   Height as of this encounter: 5' 5.5" (1.664 m).   Weight as of this encounter: 228 lb (103.4 kg).  EKG (optional): deferred due to virtual visit  GENERAL: alert, oriented, no audible sounds of distress, full vision exam deferred due to pandemic and/or virtual encounter  PSYCH/NEURO: pleasant and cooperative, no obvious depression or anxiety, speech and thought processing grossly intact, Cognitive function grossly intact  Flowsheet Row Video Visit from 09/17/2022 in [facility]  PHQ-9 Total Score 0           09/17/2022   12:33 PM 06/07/2022    2:19 PM 07/10/2021    1:33 PM 06/14/2020    1:13 PM 04/21/2020    1:17 PM  Depression screen PHQ 2/9  Decreased Interest 0 0 0 0 0  Down, Depressed, Hopeless 0 0 0 0 0  PHQ - 2 Score 0 0 0 0 0  Altered sleeping 0      Tired, decreased energy 0      Change in appetite 0      Feeling bad or failure about yourself  0      Trouble concentrating 0      Moving slowly or fidgety/restless 0      Suicidal thoughts 0      PHQ-9 Score 0      Difficult doing work/chores Not difficult at all           10/08/2017    2:50 PM 04/21/2020    1:17 PM 06/14/2020    1:17 PM 07/10/2021    1:33 PM 06/07/2022    2:19 PM  Fall Risk  Falls in the past year? No 0 1 0 1  Was there an injury with Fall?  0 1  0  Was there an injury with Fall? - Comments   bruise knee and scratch hands    Fall Risk Category Calculator  0 3  1  Fall Risk Category (Retired)  Low High  Low   (RETIRED) Patient Fall Risk Level  Low fall risk  Low fall risk Low fall risk  Patient at Risk for Falls Due to    Medication side effect No Fall Risks  Fall risk Follow up  Falls evaluation completed  Falls evaluation completed;Education provided;Falls prevention discussed Falls evaluation completed     SUMMARY AND PLAN:  Medicare annual wellness visit, subsequent   Discussed applicable health maintenance/preventive health measures and advised and referred or ordered per patient preferences:  Health Maintenance  Topic Date Due   OPHTHALMOLOGY EXAM  She plans to schedule this right away.    Diabetic kidney evaluation - Urine ACR  Never done, sees PCP soon and plans to do then   Hepatitis C Screening  Never done, she plans to request at her visit   COLONOSCOPY (Pts 45-19yrs Insurance coverage will need to be confirmed)  Never done, she got too much anxiety about doing the procedure. She couldn't do it. Discussed other options and she is considering doing cologuard. She wants to ask Dr. Elease Hashimoto about it at her visit this week and reports will request he order if decides to do it.     MAMMOGRAM  Has not done in awhile. She plans to schedule and prefers to have PCP order - prefers to ask at appt this week.    Zoster Vaccines- Shingrix (1 of 2) Never done, she had some questions. She is considering. Knows can get at the pharmacy.    DEXA SCAN  Never done, she also plans to discuss this with Dr. Elease Hashimoto and get done.    FOOT EXAM  08/19/2020   Diabetic kidney evaluation - eGFR measurement  04/20/2022, doing with PCP later this week   COVID-19 Vaccine (6 - 2023-24 season) UTD, did in the fall. Plans to do yearly.    Medicare Annual Wellness (AWV)  07/10/2022   HEMOGLOBIN A1C  08/01/2022, doing with PCP this week   DTaP/Tdap/Td (2 - Td or Tdap) 03/23/2023   Pneumonia Vaccine 77+ Years old  Completed   INFLUENZA VACCINE  Completed   HPV VACCINES  Aged Out     Education and counseling  on the following was provided based on the above review of health and a plan/checklist for the patient, along with additional information discussed, was provided for the patient in the patient instructions :   -Provided counseling and plan for increased risk of falling if applicable per above screening. (Referral for PT, community based exercise programs, etc.) Discussed balance exercises and how to do safely. She plans to see PCP later this week for eval as well.  -Advised and counseled on healthy lifestyle - including the importance of a healthy diet, regular physical activity -information about social connections and stress management provided -Congratulated on healthier diet and encouraged increasing low sugar veggies, ensuring carbs are whole grain. Discussed lower sugar furits.  -encouraged to get back into exercising and she seems motivated to start the balance exercises and to try to get back into swimming. Also discussed trying to incorporate some weight bearing exercises and discussed options for when time is limited.  -Advise yearly dental visits at minimum and regular eye exams   Follow up: see patient instructions     Patient Instructions  I really enjoyed getting to talk with you today! I am available on Tuesdays and Thursdays for virtual visits if you have any questions or concerns, or if I can be of any further assistance.   [place]  WELLNESS VISIT:  -Follow up (please call to schedule if not scheduled after visit):  -Inperson visit with your Primary Doctor office: A As scheduled later this week and please inquire about:    Cologuard for colon cancer screening    Mammogram and DEXA    Diabetes lab test and kidney test    Diabetic foot exam    Hepatitis C screening  -yearly for annual wellness visit with primary care office  Please schedule your diabetic eye exam with your eye doctor.   Here is a list of your preventive care/health maintenance measures and  the plan for each if any are due:  Health Maintenance  Topic Date Due   OPHTHALMOLOGY EXAM  Never done   Diabetic kidney evaluation - Urine ACR  Never done   Hepatitis C Screening  Never done   COLONOSCOPY (Pts 45-52yrs Insurance coverage will need to be confirmed)  Never done   MAMMOGRAM  Never done   Zoster Vaccines- Shingrix (1 of 2) Never done   DEXA SCAN  Never done   FOOT EXAM  08/19/2020   Diabetic kidney evaluation - eGFR measurement  04/20/2022       Medicare Annual Wellness (AWV)  Done today   HEMOGLOBIN A1C  08/01/2022   DTaP/Tdap/Td (2 - Td or Tdap) 03/23/2023   Pneumonia Vaccine 42+ Years old  Completed   INFLUENZA VACCINE   COVID-19 Vaccine (6 - 2023-24 season) Completed, Due yearly   Completed   HPV VACCINES  Aged Out    -See a dentist at least yearly  -Get your eyes checked and then per your eye specialist's recommendations  -Other issues addressed today:   -I have included below further information regarding a healthy whole foods based diet, physical activity guidelines for adults, stress management and opportunities for social connections. I hope you find this information useful.   -----------------------------------------------------------------------------------------------------------------------------------------------------------------------------------------------------------------------------------------------------------  NUTRITION: -eat real food: lots of colorful vegetables (half the plate) and fruits -5-7 servings of vegetables and fruits per day (fresh or steamed is best), exp. 2 servings of vegetables with lunch and dinner and 2 servings of fruit per day. Berries and greens such as kale and collards are great choices.  -consume on a regular basis: whole grains (make sure first ingredient on label contains the word "whole"), fresh fruits, fish, nuts, seeds, healthy oils (such as olive oil, avocado oil, grape seed oil) -may eat small amounts of  dairy and lean meat on occasion, but avoid processed meats such as ham, bacon, lunch meat, etc. -drink water -try to avoid fast food and pre-packaged foods, processed meat -most experts advise limiting sodium to < 2300mg  per day, should limit further is any chronic conditions such as high blood pressure, heart disease, diabetes, etc. The American Heart Association advised that < 1500mg  is is ideal -try to avoid foods that contain any ingredients with names you do not recognize  -try to avoid sugar/sweets (except for the natural sugar that occurs in fresh fruit) -try to avoid sweet drinks -try to avoid white rice, white bread, pasta (unless whole grain), white or yellow potatoes  EXERCISE GUIDELINES FOR ADULTS: -if you wish to increase your physical activity, do so gradually and with the approval of your doctor -STOP and seek medical care immediately if you have any chest pain, chest discomfort or trouble breathing when starting or increasing exercise  -move and stretch your body, legs, feet and arms when sitting for long periods -Physical activity guidelines for optimal health in adults: -least  150 minutes per week of aerobic exercise (can talk, but not sing) once approved by your doctor, 20-30 minutes of sustained activity or two 10 minute episodes of sustained activity every day.  -resistance training at least 2 days per week if approved by your doctor -balance exercises 3+ days per week:   Stand somewhere where you have something sturdy to hold onto if you lose balance.    1) lift up on toes, start with 5x per day and work up to 20x   2) stand and lift on leg straight out to the side so that foot is a few inches of the floor, start with 5x each side and work up to 20x each side   3) stand on one foot, start with 5 seconds each side and work up to 20 seconds on each side  If you need ideas or help with getting more active:  -Silver sneakers https://tools.silversneakers.com  -Walk with a  Doc: http://stephens-thompson.biz/  -try to include resistance (weight lifting/strength building) and balance exercises twice per week: or the following link for ideas: ChessContest.fr  UpdateClothing.com.cy  STRESS MANAGEMENT: -can try meditating, or just sitting quietly with deep breathing while intentionally relaxing all parts of your body for 5 minutes daily -if you need further help with stress, anxiety or depression please follow up with your primary doctor or contact the wonderful folks at [place]: [place]: -options in [place] if you wish to engage in more social and exercise related activities:  -Silver sneakers https://tools.silversneakers.com  -Walk with a Doc: http://stephens-thompson.biz/  -Check out the [place] 50+ section on the [place] [employer] (hiking clubs, book clubs, cards and games, chess, exercise classes, aquatic classes and much more) - see the website for details: https://www.[place].gov/departments/parks-recreation/active-adults50  -YouTube has lots of exercise videos for different ages and abilities as well  -[place] (a variety of indoor and outdoor inperson activities for adults). [phone]. [address]  -Virtual Online Classes (a variety of topics): see seniorplanet.org or call [place], DO

## 2022-09-18 ENCOUNTER — Ambulatory Visit: Payer: Medicare PPO | Admitting: Family Medicine

## 2022-09-20 ENCOUNTER — Ambulatory Visit: Payer: Medicare PPO | Admitting: Family Medicine

## 2022-09-27 ENCOUNTER — Encounter: Payer: Self-pay | Admitting: Family Medicine

## 2022-09-27 ENCOUNTER — Ambulatory Visit (INDEPENDENT_AMBULATORY_CARE_PROVIDER_SITE_OTHER): Payer: Medicare PPO | Admitting: Family Medicine

## 2022-09-27 DIAGNOSIS — E785 Hyperlipidemia, unspecified: Secondary | ICD-10-CM | POA: Diagnosis not present

## 2022-09-27 DIAGNOSIS — Z1211 Encounter for screening for malignant neoplasm of colon: Secondary | ICD-10-CM

## 2022-09-27 DIAGNOSIS — E1169 Type 2 diabetes mellitus with other specified complication: Secondary | ICD-10-CM

## 2022-09-27 DIAGNOSIS — E1165 Type 2 diabetes mellitus with hyperglycemia: Secondary | ICD-10-CM

## 2022-09-27 DIAGNOSIS — Z780 Asymptomatic menopausal state: Secondary | ICD-10-CM | POA: Diagnosis not present

## 2022-09-27 DIAGNOSIS — I10 Essential (primary) hypertension: Secondary | ICD-10-CM | POA: Diagnosis not present

## 2022-09-27 LAB — COMPREHENSIVE METABOLIC PANEL
ALT: 23 U/L (ref 0–35)
AST: 21 U/L (ref 0–37)
Albumin: 4.2 g/dL (ref 3.5–5.2)
Alkaline Phosphatase: 80 U/L (ref 39–117)
BUN: 10 mg/dL (ref 6–23)
CO2: 26 mEq/L (ref 19–32)
Calcium: 10.1 mg/dL (ref 8.4–10.5)
Chloride: 98 mEq/L (ref 96–112)
Creatinine, Ser: 0.57 mg/dL (ref 0.40–1.20)
GFR: 89.15 mL/min (ref >60.00)
Glucose, Bld: 130 mg/dL — ABNORMAL HIGH (ref 70–99)
Potassium: 4.2 mEq/L (ref 3.5–5.1)
Sodium: 134 mEq/L — ABNORMAL LOW (ref 135–145)
Total Bilirubin: 1.3 mg/dL — ABNORMAL HIGH (ref 0.2–1.2)
Total Protein: 7.5 g/dL (ref 6.0–8.3)

## 2022-09-27 LAB — MICROALBUMIN / CREATININE URINE RATIO
Creatinine,U: 213.5 mg/dL
Microalb Creat Ratio: 0.9 mg/g (ref 0.0–30.0)
Microalb, Ur: 1.9 mg/dL (ref 0.0–1.9)

## 2022-09-27 LAB — POCT GLYCOSYLATED HEMOGLOBIN (HGB A1C): Hemoglobin A1C: 6.1 % — AB (ref 4.0–5.6)

## 2022-09-27 LAB — LIPID PANEL
Cholesterol: 142 mg/dL (ref 0–200)
HDL: 54.6 mg/dL (ref >39.00)
LDL Cholesterol: 63 mg/dL (ref 0–99)
NonHDL: 87.11
Total CHOL/HDL Ratio: 3
Triglycerides: 120 mg/dL (ref 0.0–149.0)
VLDL: 24 mg/dL (ref 0.0–40.0)

## 2022-09-27 NOTE — Progress Notes (Signed)
Established Patient Office Visit  Subjective   Patient ID: Joanne Mendoza, female    DOB: 05-Oct-1945  Age: 77 y.o. MRN: [id number]  Chief Complaint  Patient presents with   Medical Management of Chronic Issues    HPI   Joanne Mendoza is here for medical follow-up.  She had severe sinus infection and after second go round of antibiotic her symptoms finally improved.  She still feels like she is having some balance issues intermittently.  She has not yet decided whether she wishes to pursue physical therapy.  She had recent Medicare wellness visit.  She is overdue for colon cancer screening.  She had previous concerns regarding sedation for colonoscopy.  She is willing to consider Cologuard.  Also has not had recent DEXA scan.  She also need to set up mammogram.  She has managed to lose some weight due to this dietary change.  Blood pressures improved today.  A1c also improved to 6.1%.  She takes atorvastatin 40 mg daily for hyperlipidemia and has hypertension treated with losartan HCTZ, amlodipine, and Toprol-XL.  She remains on metformin.  She continues to spend much of her time caring for her 27 year old mother.  She has a brother that helps in this regard as well.  Past Medical History:  Diagnosis Date   Hyperlipidemia    Hypertension    Past Surgical History:  Procedure Laterality Date   broken arm     CESAREAN SECTION     x 2     reports that she has never smoked. She has never used smokeless tobacco. She reports that she does not drink alcohol and does not use drugs. family history includes Alcohol abuse in her father; Colon cancer in her paternal uncle; Hyperlipidemia in an other family member; Hypertension in her mother; Pancreatic cancer in her paternal uncle. Allergies  Allergen Reactions   Penicillins     REACTION: hives    Review of Systems  Constitutional:  Negative for malaise/fatigue.  Eyes:  Negative for blurred vision.  Respiratory:  Negative for shortness of  breath.   Cardiovascular:  Negative for chest pain.  Neurological:  Negative for dizziness, weakness and headaches.      Objective:     There were no vitals taken for this visit.   Physical Exam Vitals reviewed.  Constitutional:      Appearance: She is well-developed.  Eyes:     Pupils: Pupils are equal, round, and reactive to light.  Neck:     Thyroid: No thyromegaly.     Vascular: No JVD.  Cardiovascular:     Rate and Rhythm: Normal rate and regular rhythm.     Heart sounds:     No gallop.     Comments: She has faint systolic murmur over aortic area. Pulmonary:     Effort: Pulmonary effort is normal. No respiratory distress.     Breath sounds: Normal breath sounds. No wheezing or rales.  Musculoskeletal:     Cervical back: Neck supple.     Right lower leg: No edema.     Left lower leg: No edema.  Neurological:     Mental Status: She is alert.      Results for orders placed or performed in visit on 09/27/22  POC HgB A1c  Result Value Ref Range   Hemoglobin A1C 6.1 (A) 4.0 - 5.6 %   HbA1c POC (<> result, manual entry)     HbA1c, POC (prediabetic range)     HbA1c, POC (controlled diabetic  range)        The 10-year ASCVD risk score (Arnett DK, et al., 2019) is: 40.1%    Assessment & Plan:   Problem List Items Addressed This Visit       Unprioritized   Type 2 diabetes mellitus with hyperglycemia ([place]) - Primary   Relevant Orders   POC HgB A1c (Completed)   Microalbumin / creatinine urine ratio   Hyperlipidemia associated with type 2 diabetes mellitus ([place])   Relevant Orders   Lipid panel   Essential hypertension   Relevant Orders   CMP   Other Visit Diagnoses     Postmenopausal       Relevant Orders   DG Bone Density     Blood sugars improving with her recent weight loss.  A1c today 6.1%.  Continue low glycemic diet and weight loss efforts and recheck in 6 months  -We did discuss setting up colon cancer screening with Cologuard versus  colonoscopy.  She prefers the former.  Will get this ordered.  -We discussed possible PT referral for balance training and she will think this over  -DEXA scan ordered  -Recheck lipid and hepatic panel.  Return in about 6 months (around 03/28/2023).    Joanne Littler, MD

## 2022-09-27 NOTE — Addendum Note (Signed)
Addended by: Nilda Riggs on: 09/27/2022 01:48 PM   Modules accepted: Orders

## 2022-09-29 ENCOUNTER — Other Ambulatory Visit: Payer: Self-pay | Admitting: Family Medicine

## 2022-09-29 DIAGNOSIS — I10 Essential (primary) hypertension: Secondary | ICD-10-CM

## 2022-11-25 ENCOUNTER — Other Ambulatory Visit: Payer: Self-pay | Admitting: Family Medicine

## 2022-12-18 ENCOUNTER — Telehealth: Payer: Self-pay | Admitting: Family Medicine

## 2022-12-18 NOTE — Telephone Encounter (Signed)
Pt called to speak to CMA, stating she has more questions regarding a conversation had earlier.  CMA was with a patient.  Pt asked that CMA call back at his earliest convenience.

## 2022-12-19 NOTE — Telephone Encounter (Signed)
I spoke with the patient in regards to her mother and her mother has been scheduled for OV tomorrow to discuss current medication and blood pressure

## 2023-02-24 ENCOUNTER — Other Ambulatory Visit: Payer: Self-pay | Admitting: Family Medicine

## 2023-03-02 ENCOUNTER — Other Ambulatory Visit: Payer: Self-pay | Admitting: Family Medicine

## 2023-03-11 ENCOUNTER — Other Ambulatory Visit: Payer: Self-pay | Admitting: Family Medicine

## 2023-03-31 ENCOUNTER — Ambulatory Visit: Payer: Medicare PPO | Admitting: Family Medicine

## 2023-03-31 VITALS — BP 128/72 | HR 73 | Temp 97.9°F | Ht 65.5 in | Wt 205.3 lb

## 2023-03-31 DIAGNOSIS — E785 Hyperlipidemia, unspecified: Secondary | ICD-10-CM

## 2023-03-31 DIAGNOSIS — E1169 Type 2 diabetes mellitus with other specified complication: Secondary | ICD-10-CM

## 2023-03-31 DIAGNOSIS — Z7984 Long term (current) use of oral hypoglycemic drugs: Secondary | ICD-10-CM

## 2023-03-31 DIAGNOSIS — I10 Essential (primary) hypertension: Secondary | ICD-10-CM

## 2023-03-31 DIAGNOSIS — M5442 Lumbago with sciatica, left side: Secondary | ICD-10-CM

## 2023-03-31 DIAGNOSIS — E1165 Type 2 diabetes mellitus with hyperglycemia: Secondary | ICD-10-CM | POA: Diagnosis not present

## 2023-03-31 DIAGNOSIS — R2689 Other abnormalities of gait and mobility: Secondary | ICD-10-CM

## 2023-03-31 LAB — POCT GLYCOSYLATED HEMOGLOBIN (HGB A1C): Hemoglobin A1C: 6 % — AB (ref 4.0–5.6)

## 2023-03-31 NOTE — Progress Notes (Signed)
Established Patient Office Visit  Subjective   Patient ID: [name], female    DOB: [birth year]  Age: 77 y.o. MRN: [id number]  No chief complaint on file.   HPI   Joanne Mendoza is seen for routine medical follow-up.  She is generally doing well.  She has had some ongoing low back pain and balance problems.  She was getting scheduled for physical therapy several months ago but her mom's health had deteriorated which put that on hold.  She would like to consider going back to physical therapy at this point.  Back in late April she was traveling through airport in [place] when she fell.  She may have exacerbated some back pain then.  She has frequent low back stiffness.  Occasional left lower extremity radiculitis symptoms from the back down toward the knee.  Denies any lower extremity weakness.  Chronic problems include hypertension, hyperlipidemia, type 2 diabetes.  Blood sugars have recently been well-controlled.  She remains on atorvastatin, amlodipine, metoprolol, metformin, and losartan/HCTZ.  She has managed to lose little bit of weight and hopes to lose more.  Lipids were reviewed from January and well-controlled with LDL cholesterol 63  Past Medical History:  Diagnosis Date   Hyperlipidemia    Hypertension    Past Surgical History:  Procedure Laterality Date   broken arm     CESAREAN SECTION     x 2     reports that she has never smoked. She has never used smokeless tobacco. She reports that she does not drink alcohol and does not use drugs. family history includes Alcohol abuse in her father; Colon cancer in her paternal uncle; Hyperlipidemia in an other family member; Hypertension in her mother; Pancreatic cancer in her paternal uncle. Allergies  Allergen Reactions   Penicillins     REACTION: hives    Review of Systems  Constitutional:  Negative for chills, fever and malaise/fatigue.  Eyes:  Negative for blurred vision.  Respiratory:  Negative for shortness of breath.    Cardiovascular:  Negative for chest pain.  Musculoskeletal:  Positive for back pain.  Neurological:  Negative for dizziness, weakness and headaches.      Objective:     BP 128/72 (BP Location: Left Arm, Patient Position: Sitting, Cuff Size: Large)   Pulse 73   Temp 97.9 F (36.6 C) (Oral)   Ht 5' 5.5" (1.664 m)   Wt 205 lb 4.8 oz (93.1 kg)   SpO2 99%   BMI 33.64 kg/m  BP Readings from Last 3 Encounters:  03/31/23 128/72  07/09/22 138/72  06/07/22 (!) 140/70   Wt Readings from Last 3 Encounters:  03/31/23 205 lb 4.8 oz (93.1 kg)  09/17/22 228 lb (103.4 kg)  07/09/22 228 lb 1.6 oz (103.5 kg)      Physical Exam Vitals reviewed.  Constitutional:      Appearance: She is well-developed.  Eyes:     Pupils: Pupils are equal, round, and reactive to light.  Neck:     Thyroid: No thyromegaly.     Vascular: No JVD.  Cardiovascular:     Rate and Rhythm: Normal rate and regular rhythm.     Heart sounds:     No gallop.  Pulmonary:     Effort: Pulmonary effort is normal. No respiratory distress.     Breath sounds: Normal breath sounds. No wheezing or rales.  Musculoskeletal:     Cervical back: Neck supple.     Comments: Slightly restricted range of motion  with internal and external rotation left hip.  No left hip lateral tenderness.  Straight leg raises are negative.  Neurological:     Mental Status: She is alert.     Comments: Full strength with dorsiflexion and plantarflexion bilaterally.      Results for orders placed or performed in visit on 03/31/23  POC HgB A1c  Result Value Ref Range   Hemoglobin A1C 6.0 (A) 4.0 - 5.6 %   HbA1c POC (<> result, manual entry)     HbA1c, POC (prediabetic range)     HbA1c, POC (controlled diabetic range)      Last CBC Lab Results  Component Value Date   WBC 19.6 (H) 06/05/2015   HGB 14.4 06/05/2015   HCT 42.1 06/05/2015   MCV 91.1 06/05/2015   MCH 31.2 06/05/2015   RDW 13.4 06/05/2015   PLT 282 06/05/2015   Last  metabolic panel Lab Results  Component Value Date   GLUCOSE 130 (H) 09/27/2022   NA 134 (L) 09/27/2022   K 4.2 09/27/2022   CL 98 09/27/2022   CO2 26 09/27/2022   BUN 10 09/27/2022   CREATININE 0.57 09/27/2022   GFR 89.15 09/27/2022   CALCIUM 10.1 09/27/2022   PROT 7.5 09/27/2022   ALBUMIN 4.2 09/27/2022   BILITOT 1.3 (H) 09/27/2022   ALKPHOS 80 09/27/2022   AST 21 09/27/2022   ALT 23 09/27/2022   ANIONGAP 11 06/05/2015   Last lipids Lab Results  Component Value Date   CHOL 142 09/27/2022   HDL 54.60 09/27/2022   LDLCALC 63 09/27/2022   LDLDIRECT 229.1 03/15/2013   TRIG 120.0 09/27/2022   CHOLHDL 3 09/27/2022   Last hemoglobin A1c Lab Results  Component Value Date   HGBA1C 6.0 (A) 03/31/2023      The 10-year ASCVD risk score (Arnett DK, et al., 2019) is: 35.8%    Assessment & Plan:   Problem List Items Addressed This Visit       Unprioritized   Type 2 diabetes mellitus with hyperglycemia (HCC) - Primary   Relevant Orders   POC HgB A1c (Completed)   Hyperlipidemia associated with type 2 diabetes mellitus (HCC)   Essential hypertension   Other Visit Diagnoses     Left-sided low back pain with left-sided sciatica, unspecified chronicity       Relevant Orders   Ambulatory referral to Physical Therapy     Type 2 diabetes well-controlled with A1c 6.0%.  Continue current medication regimen with metformin.  Her blood pressure is well-controlled on amlodipine and losartan/HCTZ.  Continue weight loss efforts.  We discussed setting up physical therapy for her chronic intermittent low back pain.  She is also having increased balance issues and would benefit from physical therapy evaluation.  Referral placed  Plan routine medical follow-up in 6 months  Return in about 6 months (around 10/01/2023).    Evelena Peat, MD

## 2023-04-30 ENCOUNTER — Other Ambulatory Visit: Payer: Self-pay

## 2023-04-30 ENCOUNTER — Ambulatory Visit (HOSPITAL_BASED_OUTPATIENT_CLINIC_OR_DEPARTMENT_OTHER): Payer: Medicare PPO | Attending: Family Medicine | Admitting: Physical Therapy

## 2023-04-30 ENCOUNTER — Encounter (HOSPITAL_BASED_OUTPATIENT_CLINIC_OR_DEPARTMENT_OTHER): Payer: Self-pay | Admitting: Physical Therapy

## 2023-04-30 DIAGNOSIS — M5459 Other low back pain: Secondary | ICD-10-CM | POA: Insufficient documentation

## 2023-04-30 DIAGNOSIS — R2689 Other abnormalities of gait and mobility: Secondary | ICD-10-CM | POA: Insufficient documentation

## 2023-04-30 DIAGNOSIS — R29898 Other symptoms and signs involving the musculoskeletal system: Secondary | ICD-10-CM | POA: Diagnosis not present

## 2023-04-30 DIAGNOSIS — M5442 Lumbago with sciatica, left side: Secondary | ICD-10-CM | POA: Insufficient documentation

## 2023-04-30 DIAGNOSIS — M6281 Muscle weakness (generalized): Secondary | ICD-10-CM | POA: Diagnosis not present

## 2023-04-30 NOTE — Therapy (Signed)
OUTPATIENT PHYSICAL THERAPY THORACOLUMBAR EVALUATION   Patient Name: Joanne Mendoza MRN: [id number] DOB:June 04, 1946, 76 y.o., female Today's Date: 04/30/2023  END OF SESSION:  PT End of Session - 04/30/23 1436     Visit Number 1    Number of Visits 16   1-2x/week for 8 weeks   Date for PT Re-Evaluation 06/25/23    Authorization Type [employer]    PT Start Time 1436    PT Stop Time 1515    PT Time Calculation (min) 39 min    Activity Tolerance Patient tolerated treatment well    Behavior During Therapy WFL for tasks assessed/performed             Past Medical History:  Diagnosis Date   Hyperlipidemia    Hypertension    Past Surgical History:  Procedure Laterality Date   broken arm     CESAREAN SECTION     x 2    Patient Active Problem List   Diagnosis Date Noted   Hyperlipidemia associated with type 2 diabetes mellitus (HCC) 10/02/2018   White coat hypertension 09/07/2013   Severe obesity (BMI >= 40) (HCC) 05/21/2013   Type 2 diabetes mellitus with hyperglycemia (HCC) 05/20/2013   Essential hypertension 12/12/2008   NONSPECIFIC ABN FINDING RAD & OTH EXAM GU ORGAN 12/12/2008    PCP: Kristian Covey, MD  REFERRING PROVIDER: Kristian Covey, MD  REFERRING DIAG: [phone] (ICD-10-CM) - Left-sided low back pain with left-sided sciatica, unspecified chronicity  Rationale for Evaluation and Treatment: Rehabilitation  THERAPY DIAG:  Other low back pain  Muscle weakness (generalized)  Other abnormalities of gait and mobility  Other symptoms and signs involving the musculoskeletal system  ONSET DATE: chronic  SUBJECTIVE:                                                                                                                                                                                           SUBJECTIVE STATEMENT: Patient states used to be more mobile but has decreased over last few years. Had a fall earlier this year when walking [place] at  [place]. Some swelling and numbness in R knee but has been getting better. Some left leg issues as well. Patient states some back aches and pains. Sciatic symptoms L hip/low back to L knee. Is afraid of falling so she limits activity which makes it worse, caught up in cycle.   PERTINENT HISTORY:  HTN, HLD, DM  PAIN:  Are you having pain? No  PRECAUTIONS: None  WEIGHT BEARING RESTRICTIONS: No  FALLS:  Has patient fallen in last 6 months? Yes. Number of falls 1  PLOF: Independent  PATIENT GOALS: get more active again and feel better    OBJECTIVE: (objective measures from initial evaluation unless otherwise dated)  PATIENT SURVEYS:  FOTO 53% function  SCREENING FOR RED FLAGS: Bowel or bladder incontinence: No Spinal tumors: No Cauda equina syndrome: No Compression fracture: No Abdominal aneurysm: No  COGNITION: Overall cognitive status: Within functional limits for tasks assessed     SENSATION: WFL   POSTURE: rounded shoulders, forward head, decreased lumbar lordosis, anterior pelvic tilt, and flexed trunk   PALPATION: Mild ttp lumbar paraspinals, L glutes  LUMBAR ROM:   AROM eval  Flexion 0% limited  Extension 75% limited  Right lateral flexion 25% limited  Left lateral flexion 25% limited  Right rotation   Left rotation    (Blank rows = not tested) * = pain/symptoms  LOWER EXTREMITY ROM:     Active  Right eval Left eval  Hip flexion    Hip extension    Hip abduction    Hip adduction    Hip internal rotation    Hip external rotation    Knee flexion    Knee extension    Ankle dorsiflexion    Ankle plantarflexion    Ankle inversion    Ankle eversion     (Blank rows = not tested) * = pain/symptoms  LOWER EXTREMITY MMT:    MMT Right eval Left eval  Hip flexion 40.5 39.1  Hip extension    Hip abduction 43.0 41.4  Hip adduction    Hip internal rotation    Hip external rotation    Knee flexion    Knee extension 73.3 66.9  Ankle  dorsiflexion    Ankle plantarflexion    Ankle inversion    Ankle eversion     (Blank rows = not tested) * = pain/symptoms   FUNCTIONAL TESTS:  5 times sit to stand: 9.53 seconds without UE support, mild unsteadiness no loss of balance  GAIT: Distance walked: 80 feet Assistive device utilized: None Level of assistance: Complete Independence Comments: forward trunk, decreased foot clearance, bilateral glute weakness  TODAY'S TREATMENT:                                                                                                                              DATE:  04/30/23  EVAL and HEP   PATIENT EDUCATION:  Education details: Patient educated on exam findings, POC, scope of PT, HEP. Person educated: Patient Education method: Explanation, Demonstration, and Handouts Education comprehension: verbalized understanding, returned demonstration, verbal cues required, and tactile cues required  HOME EXERCISE PROGRAM: Access Code: [email or web address] URL: [email or web address]  Date: 04/30/2023 - Standing Hip Abduction with Counter Support  - 2 x daily - 7 x weekly - 2 sets - 10 reps - Standing Hip Extension with Counter Support  - 2 x daily - 7 x weekly - 2 sets - 10 reps  ASSESSMENT:  CLINICAL IMPRESSION: Patient a 77 y.o. y.o. female who was  seen today for physical therapy evaluation and treatment for low back pain, hip pain and balance deficits. Patient presents with pain limited deficits in lumbar spine, hip and LE strength, ROM, endurance, activity tolerance, gait, balance, and functional mobility with ADL. Patient is having to modify and restrict ADL as indicated by outcome measure score as well as subjective information and objective measures which is affecting overall participation. Patient will benefit from skilled physical therapy in order to improve function and reduce impairment.  OBJECTIVE IMPAIRMENTS: Abnormal gait, decreased activity tolerance, decreased balance,  decreased endurance, decreased mobility, difficulty walking, decreased ROM, decreased strength, increased muscle spasms, impaired flexibility, improper body mechanics, postural dysfunction, and pain.   ACTIVITY LIMITATIONS: carrying, lifting, bending, standing, squatting, transfers, hygiene/grooming, locomotion level, and caring for others  PARTICIPATION LIMITATIONS: meal prep, cleaning, laundry, shopping, community activity, and yard work  PERSONAL FACTORS: Fitness, Time since onset of injury/illness/exacerbation, and 3+ comorbidities: HTN, HLD, DM, hx back pain  are also affecting patient's functional outcome.   REHAB POTENTIAL: Good  CLINICAL DECISION MAKING: Stable/uncomplicated  EVALUATION COMPLEXITY: Low   GOALS: Goals reviewed with patient? Yes  SHORT TERM GOALS: Target date: 05/28/2023    Patient will be independent with HEP in order to improve functional outcomes. Baseline:  Goal status: INITIAL  2.  Patient will report at least 25% improvement in symptoms for improved quality of life. Baseline:  Goal status: INITIAL    LONG TERM GOALS: Target date: 06/25/2023    Patient will report at least 75% improvement in symptoms for improved quality of life. Baseline:  Goal status: IN PROGRESS  2.  Patient will improve FOTO score by at least 9 points in order to indicate improved tolerance to activity. Baseline: 52% function Goal status: INITIAL  3.  Patient will demonstrate at least 25% improvement in lumbar ROM in all restricted planes for improved ability to move trunk while completing chores. Baseline:  Goal status: INITIAL  4.  Patient will report improved confidence with gait, balance, and strength in roder to return to general exercise routine. Baseline:  Goal status: INITIAL  5.  Patient will demonstrate 5lb increase in muscle testing in all tested musculature as evidence of improved strength to assist with stair ambulation and gait.   Baseline: see  above Goal status: INITIAL     PLAN:  PT FREQUENCY: 1-2x/week  PT DURATION: 8 weeks  PLANNED INTERVENTIONS: Therapeutic exercises, Therapeutic activity, Neuromuscular re-education, Balance training, Gait training, Patient/Family education, Joint manipulation, Joint mobilization, Stair training, Orthotic/Fit training, DME instructions, Aquatic Therapy, Dry Needling, Electrical stimulation, Spinal manipulation, Spinal mobilization, Cryotherapy, Moist heat, Compression bandaging, scar mobilization, Splintting, Taping, Traction, Ultrasound, Ionotophoresis 4mg /ml Dexamethasone, and Manual therapy  PLAN FOR NEXT SESSION: test balance, test glutes; hip strength, functional strength, lumbar mobility, postural and core strength   Wyman Songster, PT 04/30/2023, 4:10 PM   Referring diagnosis? M54.42 (ICD-10-CM) - Left-sided low back pain with left-sided sciatica, unspecified chronicity Treatment diagnosis? (if different than referring diagnosis) M54.59 What was this (referring dx) caused by? []  Surgery [x]  Fall [x]  Ongoing issue []  Arthritis []  Other: ____________  Laterality: []  Rt []  Lt [x]  Both  Check all possible CPT codes:  *CHOOSE 10 OR LESS*    []  97110 (Therapeutic Exercise)  []  92507 (SLP Treatment)  []  97112 (Neuro Re-ed)   []  92526 (Swallowing Treatment)   a [id number] (Gait Training)   []  [id number] (Cognitive Training, 1st 15 minutes) []  97140 (Manual Therapy)   []  97130 (Cognitive Training, each add'l  15 minutes)  []  97164 (Re-evaluation)                              []  Other, List CPT Code ____________  []  16109 (Therapeutic Activities)     []  97535 (Self Care)   [x]  All codes above (97110 - 97535)  []  97012 (Mechanical Traction)  []  97014 (E-stim Unattended)  []  97032 (E-stim manual)  []  97033 (Ionto)  []  97035 (Ultrasound) []  97750 (Physical Performance Training) []  [id number] (Aquatic Therapy) []  97016 (Vasopneumatic Device) []  [id number] (Paraffin) []  97034 (Contrast  Bath) []  97597 (Wound Care 1st 20 sq cm) []  97598 (Wound Care each add'l 20 sq cm) []  97760 (Orthotic Fabrication, Fitting, Training Initial) []  [id number] (Prosthetic Management and Training Initial) []  [id number] (Orthotic or Prosthetic Training/ Modification Subsequent)

## 2023-05-14 ENCOUNTER — Encounter (HOSPITAL_BASED_OUTPATIENT_CLINIC_OR_DEPARTMENT_OTHER): Payer: Self-pay | Admitting: Physical Therapy

## 2023-05-14 ENCOUNTER — Ambulatory Visit (HOSPITAL_BASED_OUTPATIENT_CLINIC_OR_DEPARTMENT_OTHER): Payer: Medicare PPO | Attending: Family Medicine | Admitting: Physical Therapy

## 2023-05-14 DIAGNOSIS — M6281 Muscle weakness (generalized): Secondary | ICD-10-CM | POA: Insufficient documentation

## 2023-05-14 DIAGNOSIS — R2689 Other abnormalities of gait and mobility: Secondary | ICD-10-CM | POA: Diagnosis not present

## 2023-05-14 DIAGNOSIS — M5459 Other low back pain: Secondary | ICD-10-CM | POA: Insufficient documentation

## 2023-05-14 DIAGNOSIS — R29898 Other symptoms and signs involving the musculoskeletal system: Secondary | ICD-10-CM | POA: Insufficient documentation

## 2023-05-14 NOTE — Therapy (Signed)
OUTPATIENT PHYSICAL THERAPY THORACOLUMBAR EVALUATION   Patient Name: Joanne Mendoza MRN: [id number] DOB:[birth year], 77 y.o., female Today's Date: 05/14/2023  END OF SESSION:  PT End of Session - 05/14/23 1152     Visit Number 2    Number of Visits 16   1-2x/week for 8 weeks   Date for PT Re-Evaluation 06/25/23    Authorization Type [employer]    PT Start Time 1152    PT Stop Time 1230    PT Time Calculation (min) 38 min    Activity Tolerance Patient tolerated treatment well    Behavior During Therapy WFL for tasks assessed/performed             Past Medical History:  Diagnosis Date   Hyperlipidemia    Hypertension    Past Surgical History:  Procedure Laterality Date   broken arm     CESAREAN SECTION     x 2    Patient Active Problem List   Diagnosis Date Noted   Hyperlipidemia associated with type 2 diabetes mellitus (HCC) 10/02/2018   White coat hypertension 09/07/2013   Severe obesity (BMI >= 40) (HCC) 05/21/2013   Type 2 diabetes mellitus with hyperglycemia (HCC) 05/20/2013   Essential hypertension 12/12/2008   NONSPECIFIC ABN FINDING RAD & OTH EXAM GU ORGAN 12/12/2008    PCP: Kristian Covey, MD  REFERRING PROVIDER: Kristian Covey, MD  REFERRING DIAG: [phone] (ICD-10-CM) - Left-sided low back pain with left-sided sciatica, unspecified chronicity  Rationale for Evaluation and Treatment: Rehabilitation  THERAPY DIAG:  Other low back pain  Muscle weakness (generalized)  Other abnormalities of gait and mobility  Other symptoms and signs involving the musculoskeletal system  ONSET DATE: chronic  SUBJECTIVE:                                                                                                                                                                                           SUBJECTIVE STATEMENT: Patient states eager to begin PT. Does not feel better. Feels great after doing exercises. Stiffness continues with side to side  exercise.   EVAL: Patient states used to be more mobile but has decreased over last few years. Had a fall earlier this year when walking [place] at [place]. Some swelling and numbness in R knee but has been getting better. Some left leg issues as well. Patient states some back aches and pains. Sciatic symptoms L hip/low back to L knee. Is afraid of falling so she limits activity which makes it worse, caught up in cycle.   PERTINENT HISTORY:  HTN, HLD, DM  PAIN:  Are you having  pain? No  PRECAUTIONS: None  WEIGHT BEARING RESTRICTIONS: No  FALLS:  Has patient fallen in last 6 months? Yes. Number of falls 1   PLOF: Independent  PATIENT GOALS: get more active again and feel better    OBJECTIVE: (objective measures from initial evaluation unless otherwise dated)  PATIENT SURVEYS:  FOTO 53% function  SCREENING FOR RED FLAGS: Bowel or bladder incontinence: No Spinal tumors: No Cauda equina syndrome: No Compression fracture: No Abdominal aneurysm: No  COGNITION: Overall cognitive status: Within functional limits for tasks assessed     SENSATION: WFL   POSTURE: rounded shoulders, forward head, decreased lumbar lordosis, anterior pelvic tilt, and flexed trunk   PALPATION: Mild ttp lumbar paraspinals, L glutes  LUMBAR ROM:   AROM eval  Flexion 0% limited  Extension 75% limited  Right lateral flexion 25% limited  Left lateral flexion 25% limited  Right rotation   Left rotation    (Blank rows = not tested) * = pain/symptoms  LOWER EXTREMITY ROM:     Active  Right eval Left eval  Hip flexion    Hip extension    Hip abduction    Hip adduction    Hip internal rotation    Hip external rotation    Knee flexion    Knee extension    Ankle dorsiflexion    Ankle plantarflexion    Ankle inversion    Ankle eversion     (Blank rows = not tested) * = pain/symptoms  LOWER EXTREMITY MMT:    MMT Right eval Left eval Right 05/14/23 Left  05/14/23  Hip  flexion 40.5 39.1    Hip extension   4/5 4/5  Hip abduction 43.0 41.4 4-/5 4/5  Hip adduction      Hip internal rotation      Hip external rotation      Knee flexion      Knee extension 73.3 66.9    Ankle dorsiflexion      Ankle plantarflexion      Ankle inversion      Ankle eversion       (Blank rows = not tested) * = pain/symptoms   FUNCTIONAL TESTS:  5 times sit to stand: 9.53 seconds without UE support, mild unsteadiness no loss of balance  GAIT: Distance walked: 80 feet Assistive device utilized: None Level of assistance: Complete Independence Comments: forward trunk, decreased foot clearance, bilateral glute weakness  TODAY'S TREATMENT:                                                                                                                              DATE:  05/14/23 Bridge 2x 10  LTR 10 x 5 second holds Ab set 10 x 5 second holds Ab set with march 2 x 10  Standing hip abduction 2 x 10 Standing hip extension 2 x 10 Standing alternating march 2 x 10 Step up 6 inch 2 x 10  Lateral stepping 5 x 15  feet bilateral   04/30/23  EVAL and HEP   PATIENT EDUCATION:  Education details: Patient educated on exam findings, POC, scope of PT, HEP. 05/14/23: HEP Person educated: Patient Education method: Programmer, multimedia, Demonstration, and Handouts Education comprehension: verbalized understanding, returned demonstration, verbal cues required, and tactile cues required  HOME EXERCISE PROGRAM: Access Code: [email or web address] URL: [email or web address]  Date: 04/30/2023 - Standing Hip Abduction with Counter Support  - 2 x daily - 7 x weekly - 2 sets - 10 reps - Standing Hip Extension with Counter Support  - 2 x daily - 7 x weekly - 2 sets - 10 reps  05/14/23 - Supine Bridge  - 1 x daily - 7 x weekly - 2 sets - 10 reps - Supine Lower Trunk Rotation  - 1 x daily - 7 x weekly - 10 reps - 5 second hold - Abdominal Bracing  - 1 x daily - 7 x weekly - 1 sets - 10 reps - 5  second hold - Supine March  - 1 x daily - 7 x weekly - 2 sets - 10 reps - Step Up  - 1 x daily - 7 x weekly - 2 sets - 10 reps - Side Stepping with Counter Support  - 1 x daily - 7 x weekly - 4 sets - 10 reps  ASSESSMENT:  CLINICAL IMPRESSION: Patient with decreased glute strength. Began glute strengthening, functional strengthening,  and spinal mobility exercises which are tolerated well. Unilateral UE support for balance with step exercise. Patient will continue to benefit from physical therapy in order to improve function and reduce impairment.   OBJECTIVE IMPAIRMENTS: Abnormal gait, decreased activity tolerance, decreased balance, decreased endurance, decreased mobility, difficulty walking, decreased ROM, decreased strength, increased muscle spasms, impaired flexibility, improper body mechanics, postural dysfunction, and pain.   ACTIVITY LIMITATIONS: carrying, lifting, bending, standing, squatting, transfers, hygiene/grooming, locomotion level, and caring for others  PARTICIPATION LIMITATIONS: meal prep, cleaning, laundry, shopping, community activity, and yard work  PERSONAL FACTORS: Fitness, Time since onset of injury/illness/exacerbation, and 3+ comorbidities: HTN, HLD, DM, hx back pain  are also affecting patient's functional outcome.   REHAB POTENTIAL: Good  CLINICAL DECISION MAKING: Stable/uncomplicated  EVALUATION COMPLEXITY: Low   GOALS: Goals reviewed with patient? Yes  SHORT TERM GOALS: Target date: 05/28/2023    Patient will be independent with HEP in order to improve functional outcomes. Baseline:  Goal status: INITIAL  2.  Patient will report at least 25% improvement in symptoms for improved quality of life. Baseline:  Goal status: INITIAL    LONG TERM GOALS: Target date: 06/25/2023    Patient will report at least 75% improvement in symptoms for improved quality of life. Baseline:  Goal status: IN PROGRESS  2.  Patient will improve FOTO score by at  least 9 points in order to indicate improved tolerance to activity. Baseline: 52% function Goal status: INITIAL  3.  Patient will demonstrate at least 25% improvement in lumbar ROM in all restricted planes for improved ability to move trunk while completing chores. Baseline:  Goal status: INITIAL  4.  Patient will report improved confidence with gait, balance, and strength in roder to return to general exercise routine. Baseline:  Goal status: INITIAL  5.  Patient will demonstrate 5lb increase in muscle testing in all tested musculature as evidence of improved strength to assist with stair ambulation and gait.   Baseline: see above Goal status: INITIAL     PLAN:  PT FREQUENCY: 1-2x/week  PT DURATION: 8 weeks  PLANNED INTERVENTIONS: Therapeutic exercises, Therapeutic activity, Neuromuscular re-education, Balance training, Gait training, Patient/Family education, Joint manipulation, Joint mobilization, Stair training, Orthotic/Fit training, DME instructions, Aquatic Therapy, Dry Needling, Electrical stimulation, Spinal manipulation, Spinal mobilization, Cryotherapy, Moist heat, Compression bandaging, scar mobilization, Splintting, Taping, Traction, Ultrasound, Ionotophoresis 4mg /ml Dexamethasone, and Manual therapy  PLAN FOR NEXT SESSION: test balance, hip strength, functional strength, lumbar mobility, postural and core strength   Wyman Songster, PT 05/14/2023, 11:53 AM   Referring diagnosis? M54.42 (ICD-10-CM) - Left-sided low back pain with left-sided sciatica, unspecified chronicity Treatment diagnosis? (if different than referring diagnosis) M54.59 What was this (referring dx) caused by? []  Surgery [x]  Fall [x]  Ongoing issue []  Arthritis []  Other: ____________  Laterality: []  Rt []  Lt [x]  Both  Check all possible CPT codes:  *CHOOSE 10 OR LESS*    []  97110 (Therapeutic Exercise)  []  92507 (SLP Treatment)  []  97112 (Neuro Re-ed)   []  92526 (Swallowing  Treatment)   a 57322 (Gait Training)   []  [id number] (Cognitive Training, 1st 15 minutes) []  97140 (Manual Therapy)   []  97130 (Cognitive Training, each add'l 15 minutes)  []  97164 (Re-evaluation)                              []  Other, List CPT Code ____________  []  97530 (Therapeutic Activities)     []  97535 (Self Care)   [x]  All codes above (97110 - 97535)  []  97012 (Mechanical Traction)  []  97014 (E-stim Unattended)  []  97032 (E-stim manual)  []  97033 (Ionto)  []  97035 (Ultrasound) []  97750 (Physical Performance Training) []  [id number] (Aquatic Therapy) []  97016 (Vasopneumatic Device) []  [id number] (Paraffin) []  97034 (Contrast Bath) []  97597 (Wound Care 1st 20 sq cm) []  97598 (Wound Care each add'l 20 sq cm) []  02542 (Orthotic Fabrication, Fitting, Training Initial) []  [id number] (Prosthetic Management and Training Initial) []  [id number] (Orthotic or Prosthetic Training/ Modification Subsequent)

## 2023-05-17 ENCOUNTER — Ambulatory Visit (HOSPITAL_BASED_OUTPATIENT_CLINIC_OR_DEPARTMENT_OTHER): Payer: Medicare PPO | Admitting: Physical Therapy

## 2023-05-17 ENCOUNTER — Encounter (HOSPITAL_BASED_OUTPATIENT_CLINIC_OR_DEPARTMENT_OTHER): Payer: Self-pay | Admitting: Physical Therapy

## 2023-05-17 DIAGNOSIS — R29898 Other symptoms and signs involving the musculoskeletal system: Secondary | ICD-10-CM | POA: Diagnosis not present

## 2023-05-17 DIAGNOSIS — M5459 Other low back pain: Secondary | ICD-10-CM

## 2023-05-17 DIAGNOSIS — R2689 Other abnormalities of gait and mobility: Secondary | ICD-10-CM | POA: Diagnosis not present

## 2023-05-17 DIAGNOSIS — M6281 Muscle weakness (generalized): Secondary | ICD-10-CM | POA: Diagnosis not present

## 2023-05-17 NOTE — Therapy (Signed)
OUTPATIENT PHYSICAL THERAPY TREATMENT   Patient Name: Joanne Mendoza MRN: [id number] DOB:05-Sep-1945, 77 y.o., female Today's Date: 05/17/2023  END OF SESSION:  PT End of Session - 05/17/23 1049     Visit Number 3    Number of Visits 16   1-2x/week for 8 weeks   Date for PT Re-Evaluation 06/25/23    Authorization Type [employer]    Authorization - Visit Number 3    Authorization - Number of Visits 16    Progress Note Due on Visit 10    PT Start Time 1049    PT Stop Time 1128    PT Time Calculation (min) 39 min    Activity Tolerance Patient tolerated treatment well    Behavior During Therapy WFL for tasks assessed/performed             Past Medical History:  Diagnosis Date   Hyperlipidemia    Hypertension    Past Surgical History:  Procedure Laterality Date   broken arm     CESAREAN SECTION     x 2    Patient Active Problem List   Diagnosis Date Noted   Hyperlipidemia associated with type 2 diabetes mellitus (HCC) 10/02/2018   White coat hypertension 09/07/2013   Severe obesity (BMI >= 40) (HCC) 05/21/2013   Type 2 diabetes mellitus with hyperglycemia (HCC) 05/20/2013   Essential hypertension 12/12/2008   NONSPECIFIC ABN FINDING RAD & OTH EXAM GU ORGAN 12/12/2008    PCP: Kristian Covey, MD  REFERRING PROVIDER: Kristian Covey, MD  REFERRING DIAG: [phone] (ICD-10-CM) - Left-sided low back pain with left-sided sciatica, unspecified chronicity  Rationale for Evaluation and Treatment: Rehabilitation  THERAPY DIAG:  Other low back pain  Muscle weakness (generalized)  Other abnormalities of gait and mobility  Other symptoms and signs involving the musculoskeletal system  ONSET DATE: chronic  SUBJECTIVE:                                                                                                                                                                                           SUBJECTIVE STATEMENT: Patient states she feels more  fluid. Thinks she overdid it yesterday with chores/exercise/activity.   EVAL: Patient states used to be more mobile but has decreased over last few years. Had a fall earlier this year when walking [place]. Some swelling and numbness in R knee but has been getting better. Some left leg issues as well. Patient states some back aches and pains. Sciatic symptoms L hip/low back to L knee. Is afraid of falling so she limits activity which makes it worse, caught up  in cycle.   PERTINENT HISTORY:  HTN, HLD, DM  PAIN:  Are you having pain? No  PRECAUTIONS: None  WEIGHT BEARING RESTRICTIONS: No  FALLS:  Has patient fallen in last 6 months? Yes. Number of falls 1   PLOF: Independent  PATIENT GOALS: get more active again and feel better    OBJECTIVE: (objective measures from initial evaluation unless otherwise dated)  PATIENT SURVEYS:  FOTO 53% function  SCREENING FOR RED FLAGS: Bowel or bladder incontinence: No Spinal tumors: No Cauda equina syndrome: No Compression fracture: No Abdominal aneurysm: No  COGNITION: Overall cognitive status: Within functional limits for tasks assessed     SENSATION: WFL   POSTURE: rounded shoulders, forward head, decreased lumbar lordosis, anterior pelvic tilt, and flexed trunk   PALPATION: Mild ttp lumbar paraspinals, L glutes  LUMBAR ROM:   AROM eval  Flexion 0% limited  Extension 75% limited  Right lateral flexion 25% limited  Left lateral flexion 25% limited  Right rotation   Left rotation    (Blank rows = not tested) * = pain/symptoms  LOWER EXTREMITY ROM:     Active  Right eval Left eval  Hip flexion    Hip extension    Hip abduction    Hip adduction    Hip internal rotation    Hip external rotation    Knee flexion    Knee extension    Ankle dorsiflexion    Ankle plantarflexion    Ankle inversion    Ankle eversion     (Blank rows = not tested) * = pain/symptoms  LOWER EXTREMITY MMT:    MMT  Right eval Left eval Right 05/14/23 Left  05/14/23  Hip flexion 40.5 39.1    Hip extension   4/5 4/5  Hip abduction 43.0 41.4 4-/5 4/5  Hip adduction      Hip internal rotation      Hip external rotation      Knee flexion      Knee extension 73.3 66.9    Ankle dorsiflexion      Ankle plantarflexion      Ankle inversion      Ankle eversion       (Blank rows = not tested) * = pain/symptoms   FUNCTIONAL TESTS:  5 times sit to stand: 9.53 seconds without UE support, mild unsteadiness no loss of balance  GAIT: Distance walked: 80 feet Assistive device utilized: None Level of assistance: Complete Independence Comments: forward trunk, decreased foot clearance, bilateral glute weakness  TODAY'S TREATMENT:                                                                                                                              DATE:  05/17/23 DKTC with heels on green ball 10 x 5 second holds Bridge 2 x 10 STS 2 x 10  Step up 6 inch 2 x 10 bilateral Lateral step up 6 inch 1 x 10 bilateral Standing row GTB  2 x 10 Standing shoulder extension GTB 2 x 10  Palof press GTB 2 x 10 bilateral  05/14/23 Bridge 2x 10  LTR 10 x 5 second holds Ab set 10 x 5 second holds Ab set with march 2 x 10  Standing hip abduction 2 x 10 Standing hip extension 2 x 10 Standing alternating march 2 x 10 Step up 6 inch 2 x 10  Lateral stepping 5 x 15 feet bilateral   04/30/23  EVAL and HEP   PATIENT EDUCATION:  Education details: Patient educated on exam findings, POC, scope of PT, HEP. 05/14/23: HEP Person educated: Patient Education method: Programmer, multimedia, Demonstration, and Handouts Education comprehension: verbalized understanding, returned demonstration, verbal cues required, and tactile cues required  HOME EXERCISE PROGRAM: Access Code: [email or web address] URL: [email or web address]  Date: 04/30/2023 - Standing Hip Abduction with Counter Support  - 2 x daily - 7 x weekly - 2 sets - 10  reps - Standing Hip Extension with Counter Support  - 2 x daily - 7 x weekly - 2 sets - 10 reps  05/14/23 - Supine Bridge  - 1 x daily - 7 x weekly - 2 sets - 10 reps - Supine Lower Trunk Rotation  - 1 x daily - 7 x weekly - 10 reps - 5 second hold - Abdominal Bracing  - 1 x daily - 7 x weekly - 1 sets - 10 reps - 5 second hold - Supine March  - 1 x daily - 7 x weekly - 2 sets - 10 reps - Step Up  - 1 x daily - 7 x weekly - 2 sets - 10 reps - Side Stepping with Counter Support  - 1 x daily - 7 x weekly - 4 sets - 10 reps  05/17/23 - Standing Shoulder Row with Anchored Resistance  - 1 x daily - 7 x weekly - 2 sets - 10 reps - Shoulder extension with resistance - Neutral  - 1 x daily - 7 x weekly - 2 sets - 10 reps - Standing Anti-Rotation Press with Anchored Resistance  - 1 x daily - 7 x weekly - 2 sets - 10 reps  ASSESSMENT:  CLINICAL IMPRESSION: Continued with core/ glute strengthening, functional strengthening,  and spinal mobility exercises which are tolerated well. Additional functional strength tolerated well. Unilateral UE support for balance with step exercise. Resisted postural strengthening performed well with intermittent cueing. Patient will continue to benefit from physical therapy in order to improve function and reduce impairment.   OBJECTIVE IMPAIRMENTS: Abnormal gait, decreased activity tolerance, decreased balance, decreased endurance, decreased mobility, difficulty walking, decreased ROM, decreased strength, increased muscle spasms, impaired flexibility, improper body mechanics, postural dysfunction, and pain.   ACTIVITY LIMITATIONS: carrying, lifting, bending, standing, squatting, transfers, hygiene/grooming, locomotion level, and caring for others  PARTICIPATION LIMITATIONS: meal prep, cleaning, laundry, shopping, community activity, and yard work  PERSONAL FACTORS: Fitness, Time since onset of injury/illness/exacerbation, and 3+ comorbidities: HTN, HLD, DM, hx back pain   are also affecting patient's functional outcome.   REHAB POTENTIAL: Good  CLINICAL DECISION MAKING: Stable/uncomplicated  EVALUATION COMPLEXITY: Low   GOALS: Goals reviewed with patient? Yes  SHORT TERM GOALS: Target date: 05/28/2023    Patient will be independent with HEP in order to improve functional outcomes. Baseline:  Goal status: INITIAL  2.  Patient will report at least 25% improvement in symptoms for improved quality of life. Baseline:  Goal status: INITIAL    LONG TERM GOALS: Target date: 06/25/2023  Patient will report at least 75% improvement in symptoms for improved quality of life. Baseline:  Goal status: IN PROGRESS  2.  Patient will improve FOTO score by at least 9 points in order to indicate improved tolerance to activity. Baseline: 52% function Goal status: INITIAL  3.  Patient will demonstrate at least 25% improvement in lumbar ROM in all restricted planes for improved ability to move trunk while completing chores. Baseline:  Goal status: INITIAL  4.  Patient will report improved confidence with gait, balance, and strength in roder to return to general exercise routine. Baseline:  Goal status: INITIAL  5.  Patient will demonstrate 5lb increase in muscle testing in all tested musculature as evidence of improved strength to assist with stair ambulation and gait.   Baseline: see above Goal status: INITIAL     PLAN:  PT FREQUENCY: 1-2x/week  PT DURATION: 8 weeks  PLANNED INTERVENTIONS: Therapeutic exercises, Therapeutic activity, Neuromuscular re-education, Balance training, Gait training, Patient/Family education, Joint manipulation, Joint mobilization, Stair training, Orthotic/Fit training, DME instructions, Aquatic Therapy, Dry Needling, Electrical stimulation, Spinal manipulation, Spinal mobilization, Cryotherapy, Moist heat, Compression bandaging, scar mobilization, Splintting, Taping, Traction, Ultrasound, Ionotophoresis 4mg /ml  Dexamethasone, and Manual therapy  PLAN FOR NEXT SESSION: test balance, hip strength, functional strength, lumbar mobility, postural and core strength   Wyman Songster, PT 05/17/2023, 10:49 AM   Referring diagnosis? M54.42 (ICD-10-CM) - Left-sided low back pain with left-sided sciatica, unspecified chronicity Treatment diagnosis? (if different than referring diagnosis) M54.59 What was this (referring dx) caused by? []  Surgery [x]  Fall [x]  Ongoing issue []  Arthritis []  Other: ____________  Laterality: []  Rt []  Lt [x]  Both  Check all possible CPT codes:  *CHOOSE 10 OR LESS*    []  97110 (Therapeutic Exercise)  []  92507 (SLP Treatment)  []  97112 (Neuro Re-ed)   []  92526 (Swallowing Treatment)   a 08657 (Gait Training)   []  84696 (Cognitive Training, 1st 15 minutes) []  97140 (Manual Therapy)   []  97130 (Cognitive Training, each add'l 15 minutes)  []  97164 (Re-evaluation)                              []  Other, List CPT Code ____________  []  97530 (Therapeutic Activities)     []  97535 (Self Care)   [x]  All codes above (97110 - 97535)  []  97012 (Mechanical Traction)  []  97014 (E-stim Unattended)  []  97032 (E-stim manual)  []  97033 (Ionto)  []  97035 (Ultrasound) []  97750 (Physical Performance Training) []  [id number] (Aquatic Therapy) []  97016 (Vasopneumatic Device) []  [id number] (Paraffin) []  97034 (Contrast Bath) []  97597 (Wound Care 1st 20 sq cm) []  97598 (Wound Care each add'l 20 sq cm) []  97760 (Orthotic Fabrication, Fitting, Training Initial) []  [id number] (Prosthetic Management and Training Initial) []  [id number] (Orthotic or Prosthetic Training/ Modification Subsequent)

## 2023-05-24 ENCOUNTER — Other Ambulatory Visit: Payer: Self-pay | Admitting: Family Medicine

## 2023-05-26 ENCOUNTER — Encounter (HOSPITAL_BASED_OUTPATIENT_CLINIC_OR_DEPARTMENT_OTHER): Payer: Self-pay

## 2023-05-26 ENCOUNTER — Ambulatory Visit (HOSPITAL_BASED_OUTPATIENT_CLINIC_OR_DEPARTMENT_OTHER): Payer: Medicare PPO

## 2023-05-26 DIAGNOSIS — M6281 Muscle weakness (generalized): Secondary | ICD-10-CM | POA: Diagnosis not present

## 2023-05-26 DIAGNOSIS — R2689 Other abnormalities of gait and mobility: Secondary | ICD-10-CM | POA: Diagnosis not present

## 2023-05-26 DIAGNOSIS — M5459 Other low back pain: Secondary | ICD-10-CM

## 2023-05-26 DIAGNOSIS — R29898 Other symptoms and signs involving the musculoskeletal system: Secondary | ICD-10-CM | POA: Diagnosis not present

## 2023-05-26 NOTE — Therapy (Signed)
OUTPATIENT PHYSICAL THERAPY TREATMENT   Patient Name: Joanne Mendoza MRN: [id number] DOB:01/04/1946, 77 y.o., female Today's Date: 05/26/2023  END OF SESSION:  PT End of Session - 05/26/23 1610     Visit Number 4    Number of Visits 16    Date for PT Re-Evaluation 06/25/23    Authorization Type [employer]    Authorization - Visit Number 4    Authorization - Number of Visits 16    Progress Note Due on Visit 10    PT Start Time 1602    PT Stop Time 1646    PT Time Calculation (min) 44 min    Activity Tolerance Patient tolerated treatment well    Behavior During Therapy WFL for tasks assessed/performed              Past Medical History:  Diagnosis Date   Hyperlipidemia    Hypertension    Past Surgical History:  Procedure Laterality Date   broken arm     CESAREAN SECTION     x 2    Patient Active Problem List   Diagnosis Date Noted   Hyperlipidemia associated with type 2 diabetes mellitus (HCC) 10/02/2018   White coat hypertension 09/07/2013   Severe obesity (BMI >= 40) (HCC) 05/21/2013   Type 2 diabetes mellitus with hyperglycemia (HCC) 05/20/2013   Essential hypertension 12/12/2008   NONSPECIFIC ABN FINDING RAD & OTH EXAM GU ORGAN 12/12/2008    PCP: Kristian Covey, MD  REFERRING PROVIDER: Kristian Covey, MD  REFERRING DIAG: [phone] (ICD-10-CM) - Left-sided low back pain with left-sided sciatica, unspecified chronicity  Rationale for Evaluation and Treatment: Rehabilitation  THERAPY DIAG:  Other low back pain  Other abnormalities of gait and mobility  Muscle weakness (generalized)  Other symptoms and signs involving the musculoskeletal system  ONSET DATE: chronic  SUBJECTIVE:                                                                                                                                                                                           SUBJECTIVE STATEMENT: Patient states she tried her step ups at home, but felt  some pain so she stopped. "I don't know if I should keep doing those at home." She reports continued low back stiffness. Compliant with HEP.  EVAL: Patient states used to be more mobile but has decreased over last few years. Had a fall earlier this year when walking [place]. Some swelling and numbness in R knee but has been getting better. Some left leg issues as well. Patient states some back aches and pains. Sciatic symptoms L hip/low  back to L knee. Is afraid of falling so she limits activity which makes it worse, caught up in cycle.   PERTINENT HISTORY:  HTN, HLD, DM  PAIN:  Are you having pain? No  PRECAUTIONS: None  WEIGHT BEARING RESTRICTIONS: No  FALLS:  Has patient fallen in last 6 months? Yes. Number of falls 1   PLOF: Independent  PATIENT GOALS: get more active again and feel better    OBJECTIVE: (objective measures from initial evaluation unless otherwise dated)  PATIENT SURVEYS:  FOTO 53% function  SCREENING FOR RED FLAGS: Bowel or bladder incontinence: No Spinal tumors: No Cauda equina syndrome: No Compression fracture: No Abdominal aneurysm: No  COGNITION: Overall cognitive status: Within functional limits for tasks assessed     SENSATION: WFL   POSTURE: rounded shoulders, forward head, decreased lumbar lordosis, anterior pelvic tilt, and flexed trunk   PALPATION: Mild ttp lumbar paraspinals, L glutes  LUMBAR ROM:   AROM eval  Flexion 0% limited  Extension 75% limited  Right lateral flexion 25% limited  Left lateral flexion 25% limited  Right rotation   Left rotation    (Blank rows = not tested) * = pain/symptoms  LOWER EXTREMITY ROM:     Active  Right eval Left eval  Hip flexion    Hip extension    Hip abduction    Hip adduction    Hip internal rotation    Hip external rotation    Knee flexion    Knee extension    Ankle dorsiflexion    Ankle plantarflexion    Ankle inversion    Ankle eversion     (Blank rows  = not tested) * = pain/symptoms  LOWER EXTREMITY MMT:    MMT Right eval Left eval Right 05/14/23 Left  05/14/23  Hip flexion 40.5 39.1    Hip extension   4/5 4/5  Hip abduction 43.0 41.4 4-/5 4/5  Hip adduction      Hip internal rotation      Hip external rotation      Knee flexion      Knee extension 73.3 66.9    Ankle dorsiflexion      Ankle plantarflexion      Ankle inversion      Ankle eversion       (Blank rows = not tested) * = pain/symptoms   FUNCTIONAL TESTS:  5 times sit to stand: 9.53 seconds without UE support, mild unsteadiness no loss of balance  GAIT: Distance walked: 80 feet Assistive device utilized: None Level of assistance: Complete Independence Comments: forward trunk, decreased foot clearance, bilateral glute weakness  TODAY'S TREATMENT:                                                                                                                              DATE:   05/26/23 DKTC without ball 30sec x3 LTR 5" x10ea Manual HSS Manual ER hip stretch bil Manual hip flexor stretch in sidelying  Bridge 2 x 10 3sec hold Sidelying hip abduction 2x10 R LE, sidelying clams 2x10 L LE Gait in hall- x73ft Resisted side stepping at rail RTB at ankles x2 laps Step up 6 inch 2 x 10 bilateral Lateral step up 6 inch 2 x 10 bilateral     05/17/23 DKTC with heels on green ball 10 x 5 second holds Bridge 2 x 10 STS 2 x 10  Step up 6 inch 2 x 10 bilateral Lateral step up 6 inch 1 x 10 bilateral Standing row GTB 2 x 10 Standing shoulder extension GTB 2 x 10  Palof press GTB 2 x 10 bilateral  05/14/23 Bridge 2x 10  LTR 10 x 5 second holds Ab set 10 x 5 second holds Ab set with march 2 x 10  Standing hip abduction 2 x 10 Standing hip extension 2 x 10 Standing alternating march 2 x 10 Step up 6 inch 2 x 10  Lateral stepping 5 x 15 feet bilateral   04/30/23  EVAL and HEP   PATIENT EDUCATION:  Education details: Patient educated on exam findings, POC,  scope of PT, HEP. 05/14/23: HEP Person educated: Patient Education method: Programmer, multimedia, Demonstration, and Handouts Education comprehension: verbalized understanding, returned demonstration, verbal cues required, and tactile cues required  HOME EXERCISE PROGRAM: Access Code: [email or web address] URL: [email or web address]  Date: 04/30/2023 - Standing Hip Abduction with Counter Support  - 2 x daily - 7 x weekly - 2 sets - 10 reps - Standing Hip Extension with Counter Support  - 2 x daily - 7 x weekly - 2 sets - 10 reps  05/14/23 - Supine Bridge  - 1 x daily - 7 x weekly - 2 sets - 10 reps - Supine Lower Trunk Rotation  - 1 x daily - 7 x weekly - 10 reps - 5 second hold - Abdominal Bracing  - 1 x daily - 7 x weekly - 1 sets - 10 reps - 5 second hold - Supine March  - 1 x daily - 7 x weekly - 2 sets - 10 reps - Step Up  - 1 x daily - 7 x weekly - 2 sets - 10 reps - Side Stepping with Counter Support  - 1 x daily - 7 x weekly - 4 sets - 10 reps  05/17/23 - Standing Shoulder Row with Anchored Resistance  - 1 x daily - 7 x weekly - 2 sets - 10 reps - Shoulder extension with resistance - Neutral  - 1 x daily - 7 x weekly - 2 sets - 10 reps - Standing Anti-Rotation Press with Anchored Resistance  - 1 x daily - 7 x weekly - 2 sets - 10 reps  ASSESSMENT:  CLINICAL IMPRESSION: Pt very limited with hip ER bilaterally. Performed manual stretching of this to improve. Will prescribe HEP fig 4 stretch when pt can obtain the position independently. Pt had difficulty with sidelying hip abduction on L LE so switched to clams. Pt performs step ups better with contralateral rail assist. No c/o pain in hip or knee with step ups. Demonstrates trendelenburg sign with ambulation, but can control this with UE support.   OBJECTIVE IMPAIRMENTS: Abnormal gait, decreased activity tolerance, decreased balance, decreased endurance, decreased mobility, difficulty walking, decreased ROM, decreased strength, increased  muscle spasms, impaired flexibility, improper body mechanics, postural dysfunction, and pain.   ACTIVITY LIMITATIONS: carrying, lifting, bending, standing, squatting, transfers, hygiene/grooming, locomotion level, and caring for others  PARTICIPATION LIMITATIONS: meal prep, cleaning, laundry, shopping,  community activity, and yard work  PERSONAL FACTORS: Fitness, Time since onset of injury/illness/exacerbation, and 3+ comorbidities: HTN, HLD, DM, hx back pain  are also affecting patient's functional outcome.   REHAB POTENTIAL: Good  CLINICAL DECISION MAKING: Stable/uncomplicated  EVALUATION COMPLEXITY: Low   GOALS: Goals reviewed with patient? Yes  SHORT TERM GOALS: Target date: 05/28/2023    Patient will be independent with HEP in order to improve functional outcomes. Baseline:  Goal status: INITIAL  2.  Patient will report at least 25% improvement in symptoms for improved quality of life. Baseline:  Goal status: INITIAL    LONG TERM GOALS: Target date: 06/25/2023    Patient will report at least 75% improvement in symptoms for improved quality of life. Baseline:  Goal status: IN PROGRESS  2.  Patient will improve FOTO score by at least 9 points in order to indicate improved tolerance to activity. Baseline: 52% function Goal status: INITIAL  3.  Patient will demonstrate at least 25% improvement in lumbar ROM in all restricted planes for improved ability to move trunk while completing chores. Baseline:  Goal status: INITIAL  4.  Patient will report improved confidence with gait, balance, and strength in roder to return to general exercise routine. Baseline:  Goal status: INITIAL  5.  Patient will demonstrate 5lb increase in muscle testing in all tested musculature as evidence of improved strength to assist with stair ambulation and gait.   Baseline: see above Goal status: INITIAL     PLAN:  PT FREQUENCY: 1-2x/week  PT DURATION: 8 weeks  PLANNED  INTERVENTIONS: Therapeutic exercises, Therapeutic activity, Neuromuscular re-education, Balance training, Gait training, Patient/Family education, Joint manipulation, Joint mobilization, Stair training, Orthotic/Fit training, DME instructions, Aquatic Therapy, Dry Needling, Electrical stimulation, Spinal manipulation, Spinal mobilization, Cryotherapy, Moist heat, Compression bandaging, scar mobilization, Splintting, Taping, Traction, Ultrasound, Ionotophoresis 4mg /ml Dexamethasone, and Manual therapy  PLAN FOR NEXT SESSION: test balance, hip strength, functional strength, lumbar mobility, postural and core strength   Donnel Saxon Vernor Monnig, PTA 05/26/2023, 5:01 PM   Referring diagnosis? M54.42 (ICD-10-CM) - Left-sided low back pain with left-sided sciatica, unspecified chronicity Treatment diagnosis? (if different than referring diagnosis) M54.59 What was this (referring dx) caused by? []  Surgery [x]  Fall [x]  Ongoing issue []  Arthritis []  Other: ____________  Laterality: []  Rt []  Lt [x]  Both  Check all possible CPT codes:  *CHOOSE 10 OR LESS*    []  97110 (Therapeutic Exercise)  []  27253 (SLP Treatment)  []  97112 (Neuro Re-ed)   []  92526 (Swallowing Treatment)   a [id number] (Gait Training)   []  [id number] (Cognitive Training, 1st 15 minutes) []  97140 (Manual Therapy)   []  97130 (Cognitive Training, each add'l 15 minutes)  []  97164 (Re-evaluation)                              []  Other, List CPT Code ____________  []  97530 (Therapeutic Activities)     []  97535 (Self Care)   [x]  All codes above (97110 - 97535)  []  97012 (Mechanical Traction)  []  97014 (E-stim Unattended)  []  97032 (E-stim manual)  []  97033 (Ionto)  []  97035 (Ultrasound) []  97750 (Physical Performance Training) []  [id number] (Aquatic Therapy) []  97016 (Vasopneumatic Device) []  [id number] (Paraffin) []  97034 (Contrast Bath) []  97597 (Wound Care 1st 20 sq cm) []  97598 (Wound Care each add'l 20 sq cm) []  97760 (Orthotic Fabrication, Fitting,  Training Initial) []  [id number] (Prosthetic Management and Training Initial) []  [id number] (Orthotic or Prosthetic  Training/ Modification Subsequent)

## 2023-05-27 LAB — HM DIABETES EYE EXAM

## 2023-05-28 ENCOUNTER — Ambulatory Visit (HOSPITAL_BASED_OUTPATIENT_CLINIC_OR_DEPARTMENT_OTHER): Payer: Medicare PPO | Admitting: Physical Therapy

## 2023-06-02 ENCOUNTER — Encounter (HOSPITAL_BASED_OUTPATIENT_CLINIC_OR_DEPARTMENT_OTHER): Payer: Self-pay

## 2023-06-02 ENCOUNTER — Ambulatory Visit (HOSPITAL_BASED_OUTPATIENT_CLINIC_OR_DEPARTMENT_OTHER): Payer: Medicare PPO

## 2023-06-02 DIAGNOSIS — M5459 Other low back pain: Secondary | ICD-10-CM

## 2023-06-02 DIAGNOSIS — R2689 Other abnormalities of gait and mobility: Secondary | ICD-10-CM

## 2023-06-02 DIAGNOSIS — M6281 Muscle weakness (generalized): Secondary | ICD-10-CM | POA: Diagnosis not present

## 2023-06-02 DIAGNOSIS — R29898 Other symptoms and signs involving the musculoskeletal system: Secondary | ICD-10-CM

## 2023-06-02 NOTE — Therapy (Signed)
OUTPATIENT PHYSICAL THERAPY TREATMENT   Patient Name: Joanne Mendoza MRN: [id number] DOB:Aug 11, 1946, 77 y.o., female Today's Date: 06/02/2023  END OF SESSION:  PT End of Session - 06/02/23 1431     Visit Number 5    Number of Visits 16    Date for PT Re-Evaluation 06/25/23    Authorization Type [employer]    Authorization - Visit Number 5    Authorization - Number of Visits 16    Progress Note Due on Visit 10    PT Start Time 1432    PT Stop Time 1520    PT Time Calculation (min) 48 min    Activity Tolerance Patient tolerated treatment well    Behavior During Therapy WFL for tasks assessed/performed               Past Medical History:  Diagnosis Date   Hyperlipidemia    Hypertension    Past Surgical History:  Procedure Laterality Date   broken arm     CESAREAN SECTION     x 2    Patient Active Problem List   Diagnosis Date Noted   Hyperlipidemia associated with type 2 diabetes mellitus (HCC) 10/02/2018   White coat hypertension 09/07/2013   Severe obesity (BMI >= 40) (HCC) 05/21/2013   Type 2 diabetes mellitus with hyperglycemia (HCC) 05/20/2013   Essential hypertension 12/12/2008   NONSPECIFIC ABN FINDING RAD & OTH EXAM GU ORGAN 12/12/2008    PCP: Kristian Covey, MD  REFERRING PROVIDER: Kristian Covey, MD  REFERRING DIAG: [phone] (ICD-10-CM) - Left-sided low back pain with left-sided sciatica, unspecified chronicity  Rationale for Evaluation and Treatment: Rehabilitation  THERAPY DIAG:  Other low back pain  Other abnormalities of gait and mobility  Muscle weakness (generalized)  Other symptoms and signs involving the musculoskeletal system  ONSET DATE: chronic  SUBJECTIVE:                                                                                                                                                                                           SUBJECTIVE STATEMENT: Pt reports continued stiffness in low back and hips.  Tried ER stretch at home using a scarf to assist. "I feel like I'm stronger and steadier." "I want to be able to walk without leaning."  EVAL: Patient states used to be more mobile but has decreased over last few years. Had a fall earlier this year when [place]. Some swelling and numbness in R knee but has been getting better. Some left leg issues as well. Patient states some back aches and pains. Sciatic symptoms L hip/low  back to L knee. Is afraid of falling so she limits activity which makes it worse, caught up in cycle.   PERTINENT HISTORY:  HTN, HLD, DM  PAIN:  Are you having pain? No  PRECAUTIONS: None  WEIGHT BEARING RESTRICTIONS: No  FALLS:  Has patient fallen in last 6 months? Yes. Number of falls 1   PLOF: Independent  PATIENT GOALS: get more active again and feel better    OBJECTIVE: (objective measures from initial evaluation unless otherwise dated)  PATIENT SURVEYS:  FOTO 53% function  SCREENING FOR RED FLAGS: Bowel or bladder incontinence: No Spinal tumors: No Cauda equina syndrome: No Compression fracture: No Abdominal aneurysm: No  COGNITION: Overall cognitive status: Within functional limits for tasks assessed     SENSATION: WFL   POSTURE: rounded shoulders, forward head, decreased lumbar lordosis, anterior pelvic tilt, and flexed trunk   PALPATION: Mild ttp lumbar paraspinals, L glutes  LUMBAR ROM:   AROM eval  Flexion 0% limited  Extension 75% limited  Right lateral flexion 25% limited  Left lateral flexion 25% limited  Right rotation   Left rotation    (Blank rows = not tested) * = pain/symptoms  LOWER EXTREMITY ROM:     Active  Right eval Left eval  Hip flexion    Hip extension    Hip abduction    Hip adduction    Hip internal rotation    Hip external rotation    Knee flexion    Knee extension    Ankle dorsiflexion    Ankle plantarflexion    Ankle inversion    Ankle eversion     (Blank rows =  not tested) * = pain/symptoms  LOWER EXTREMITY MMT:    MMT Right eval Left eval Right 05/14/23 Left  05/14/23  Hip flexion 40.5 39.1    Hip extension   4/5 4/5  Hip abduction 43.0 41.4 4-/5 4/5  Hip adduction      Hip internal rotation      Hip external rotation      Knee flexion      Knee extension 73.3 66.9    Ankle dorsiflexion      Ankle plantarflexion      Ankle inversion      Ankle eversion       (Blank rows = not tested) * = pain/symptoms   FUNCTIONAL TESTS:  5 times sit to stand: 9.53 seconds without UE support, mild unsteadiness no loss of balance  GAIT: Distance walked: 80 feet Assistive device utilized: None Level of assistance: Complete Independence Comments: forward trunk, decreased foot clearance, bilateral glute weakness  TODAY'S TREATMENT:                                                                                                                              DATE:   06/02/23 DKTC without ball 30sec x3 LTR 5" x10ea Manual HSS Manual ER hip stretch bil Bridge 2 x 10 3sec hold  Sidelying hip abduction 2x10 bil Gait in hall- x56ft x2 Modified Thomas stretch 2x20sec ea Standing hip flexor stretch at railing 20sec x2ea  Hip hikes 6" step 2x10 ea HEP update  05/26/23 DKTC without ball 30sec x3 LTR 5" x10ea Manual HSS Manual ER hip stretch bil Manual hip flexor stretch in sidelying Bridge 2 x 10 3sec hold Sidelying hip abduction 2x10 R LE, sidelying clams 2x10 L LE Gait in hall- x54ft Resisted side stepping at rail RTB at ankles x2 laps Step up 6 inch 2 x 10 bilateral Lateral step up 6 inch 2 x 10 bilateral     05/17/23 DKTC with heels on green ball 10 x 5 second holds Bridge 2 x 10 STS 2 x 10  Step up 6 inch 2 x 10 bilateral Lateral step up 6 inch 1 x 10 bilateral Standing row GTB 2 x 10 Standing shoulder extension GTB 2 x 10  Palof press GTB 2 x 10 bilateral  05/14/23 Bridge 2x 10  LTR 10 x 5 second holds Ab set 10 x 5 second  holds Ab set with march 2 x 10  Standing hip abduction 2 x 10 Standing hip extension 2 x 10 Standing alternating march 2 x 10 Step up 6 inch 2 x 10  Lateral stepping 5 x 15 feet bilateral   04/30/23  EVAL and HEP   PATIENT EDUCATION:  Education details: Patient educated on exam findings, POC, scope of PT, HEP. 05/14/23: HEP Person educated: Patient Education method: Programmer, multimedia, Demonstration, and Handouts Education comprehension: verbalized understanding, returned demonstration, verbal cues required, and tactile cues required  HOME EXERCISE PROGRAM: Access Code: [email or web address] URL: [email or web address]  Date: 04/30/2023 - Standing Hip Abduction with Counter Support  - 2 x daily - 7 x weekly - 2 sets - 10 reps - Standing Hip Extension with Counter Support  - 2 x daily - 7 x weekly - 2 sets - 10 reps  05/14/23 - Supine Bridge  - 1 x daily - 7 x weekly - 2 sets - 10 reps - Supine Lower Trunk Rotation  - 1 x daily - 7 x weekly - 10 reps - 5 second hold - Abdominal Bracing  - 1 x daily - 7 x weekly - 1 sets - 10 reps - 5 second hold - Supine March  - 1 x daily - 7 x weekly - 2 sets - 10 reps - Step Up  - 1 x daily - 7 x weekly - 2 sets - 10 reps - Side Stepping with Counter Support  - 1 x daily - 7 x weekly - 4 sets - 10 reps  05/17/23 - Standing Shoulder Row with Anchored Resistance  - 1 x daily - 7 x weekly - 2 sets - 10 reps - Shoulder extension with resistance - Neutral  - 1 x daily - 7 x weekly - 2 sets - 10 reps - Standing Anti-Rotation Press with Anchored Resistance  - 1 x daily - 7 x weekly - 2 sets - 10 reps  ASSESSMENT:  CLINICAL IMPRESSION: Continued with hip and lumbar stretching interventions to improve mobility. Pt has greater restrictions in L hip ER compared to R. Hip abduction strengthening performed with good technique observed and minimal correction required. Improved gait quality was observed, though hip extension is limited with mid and terminal stance  phases. Instructed pt in thomas stretching and standing hip flexor stretch which further improved gait quality. Pt expressed she does have fear of falling which limits her  movements as well. Will continue to address this at future sessions. Updated HEP to include hip flexor stretching.   OBJECTIVE IMPAIRMENTS: Abnormal gait, decreased activity tolerance, decreased balance, decreased endurance, decreased mobility, difficulty walking, decreased ROM, decreased strength, increased muscle spasms, impaired flexibility, improper body mechanics, postural dysfunction, and pain.   ACTIVITY LIMITATIONS: carrying, lifting, bending, standing, squatting, transfers, hygiene/grooming, locomotion level, and caring for others  PARTICIPATION LIMITATIONS: meal prep, cleaning, laundry, shopping, community activity, and yard work  PERSONAL FACTORS: Fitness, Time since onset of injury/illness/exacerbation, and 3+ comorbidities: HTN, HLD, DM, hx back pain  are also affecting patient's functional outcome.   REHAB POTENTIAL: Good  CLINICAL DECISION MAKING: Stable/uncomplicated  EVALUATION COMPLEXITY: Low   GOALS: Goals reviewed with patient? Yes  SHORT TERM GOALS: Target date: 05/28/2023    Patient will be independent with HEP in order to improve functional outcomes. Baseline:  Goal status: MET (9/30)  2.  Patient will report at least 25% improvement in symptoms for improved quality of life. Baseline:  Goal status: INITIAL    LONG TERM GOALS: Target date: 06/25/2023    Patient will report at least 75% improvement in symptoms for improved quality of life. Baseline:  Goal status: IN PROGRESS  2.  Patient will improve FOTO score by at least 9 points in order to indicate improved tolerance to activity. Baseline: 52% function Goal status: INITIAL  3.  Patient will demonstrate at least 25% improvement in lumbar ROM in all restricted planes for improved ability to move trunk while completing  chores. Baseline:  Goal status: INITIAL  4.  Patient will report improved confidence with gait, balance, and strength in roder to return to general exercise routine. Baseline:  Goal status: INITIAL  5.  Patient will demonstrate 5lb increase in muscle testing in all tested musculature as evidence of improved strength to assist with stair ambulation and gait.   Baseline: see above Goal status: INITIAL     PLAN:  PT FREQUENCY: 1-2x/week  PT DURATION: 8 weeks  PLANNED INTERVENTIONS: Therapeutic exercises, Therapeutic activity, Neuromuscular re-education, Balance training, Gait training, Patient/Family education, Joint manipulation, Joint mobilization, Stair training, Orthotic/Fit training, DME instructions, Aquatic Therapy, Dry Needling, Electrical stimulation, Spinal manipulation, Spinal mobilization, Cryotherapy, Moist heat, Compression bandaging, scar mobilization, Splintting, Taping, Traction, Ultrasound, Ionotophoresis 4mg /ml Dexamethasone, and Manual therapy  PLAN FOR NEXT SESSION: test balance, hip strength, functional strength, lumbar mobility, postural and core strength   Donnel Saxon Jafet Wissing, PTA 06/02/2023, 3:43 PM   Referring diagnosis? M54.42 (ICD-10-CM) - Left-sided low back pain with left-sided sciatica, unspecified chronicity Treatment diagnosis? (if different than referring diagnosis) M54.59 What was this (referring dx) caused by? []  Surgery [x]  Fall [x]  Ongoing issue []  Arthritis []  Other: ____________  Laterality: []  Rt []  Lt [x]  Both  Check all possible CPT codes:  *CHOOSE 10 OR LESS*    []  97110 (Therapeutic Exercise)  []  92507 (SLP Treatment)  []  97112 (Neuro Re-ed)   []  92526 (Swallowing Treatment)   a [id number] (Gait Training)   []  [id number] (Cognitive Training, 1st 15 minutes) []  97140 (Manual Therapy)   []  97130 (Cognitive Training, each add'l 15 minutes)  []  97164 (Re-evaluation)                              []  Other, List CPT Code ____________  []  97530  (Therapeutic Activities)     []  97535 (Self Care)   [x]  All codes above (97110 - 97535)  []   16109 (Mechanical Traction)  []  97014 (E-stim Unattended)  []  97032 (E-stim manual)  []  97033 (Ionto)  []  97035 (Ultrasound) []  97750 (Physical Performance Training) []  [id number] (Aquatic Therapy) []  09811 (Vasopneumatic Device) []  [id number] (Paraffin) []  97034 (Contrast Bath) []  97597 (Wound Care 1st 20 sq cm) []  97598 (Wound Care each add'l 20 sq cm) []  97760 (Orthotic Fabrication, Fitting, Training Initial) []  [id number] (Prosthetic Management and Training Initial) []  [id number] (Orthotic or Prosthetic Training/ Modification Subsequent)

## 2023-06-04 ENCOUNTER — Ambulatory Visit (HOSPITAL_BASED_OUTPATIENT_CLINIC_OR_DEPARTMENT_OTHER): Payer: Medicare PPO | Attending: Family Medicine | Admitting: Physical Therapy

## 2023-06-04 ENCOUNTER — Encounter (HOSPITAL_BASED_OUTPATIENT_CLINIC_OR_DEPARTMENT_OTHER): Payer: Self-pay | Admitting: Physical Therapy

## 2023-06-04 DIAGNOSIS — R2689 Other abnormalities of gait and mobility: Secondary | ICD-10-CM | POA: Diagnosis not present

## 2023-06-04 DIAGNOSIS — M6281 Muscle weakness (generalized): Secondary | ICD-10-CM | POA: Diagnosis not present

## 2023-06-04 DIAGNOSIS — M5459 Other low back pain: Secondary | ICD-10-CM | POA: Insufficient documentation

## 2023-06-04 DIAGNOSIS — R29898 Other symptoms and signs involving the musculoskeletal system: Secondary | ICD-10-CM | POA: Insufficient documentation

## 2023-06-04 NOTE — Therapy (Signed)
OUTPATIENT PHYSICAL THERAPY TREATMENT   Patient Name: Joanne Mendoza MRN: [id number] DOB:30-Jan-1946, 77 y.o., female Today's Date: 06/04/2023  END OF SESSION:  PT End of Session - 06/04/23 1346     Visit Number 6    Number of Visits 16    Date for PT Re-Evaluation 06/25/23    Authorization Type [employer]    Authorization - Visit Number 6    Authorization - Number of Visits 16    Progress Note Due on Visit 10    PT Start Time 1346    PT Stop Time 1433    PT Time Calculation (min) 47 min    Activity Tolerance Patient tolerated treatment well    Behavior During Therapy WFL for tasks assessed/performed               Past Medical History:  Diagnosis Date   Hyperlipidemia    Hypertension    Past Surgical History:  Procedure Laterality Date   broken arm     CESAREAN SECTION     x 2    Patient Active Problem List   Diagnosis Date Noted   Hyperlipidemia associated with type 2 diabetes mellitus (HCC) 10/02/2018   White coat hypertension 09/07/2013   Severe obesity (BMI >= 40) (HCC) 05/21/2013   Type 2 diabetes mellitus with hyperglycemia (HCC) 05/20/2013   Essential hypertension 12/12/2008   NONSPECIFIC ABN FINDING RAD & OTH EXAM GU ORGAN 12/12/2008    PCP: Kristian Covey, MD  REFERRING PROVIDER: Kristian Covey, MD  REFERRING DIAG: [phone] (ICD-10-CM) - Left-sided low back pain with left-sided sciatica, unspecified chronicity  Rationale for Evaluation and Treatment: Rehabilitation  THERAPY DIAG:  Other low back pain  Other abnormalities of gait and mobility  Muscle weakness (generalized)  Other symptoms and signs involving the musculoskeletal system  ONSET DATE: chronic  SUBJECTIVE:                                                                                                                                                                                           SUBJECTIVE STATEMENT: Pt reports been feeling pretty good, some feel easy  and some do not. ER stretch is difficult. Feeling stronger, still some stiffness and some pain.  EVAL: Patient states used to be more mobile but has decreased over last few years. Had a fall earlier this year when walking [place]. Some swelling and numbness in R knee but has been getting better. Some left leg issues as well. Patient states some back aches and pains. Sciatic symptoms L hip/low back to L knee. Is afraid of falling so she  limits activity which makes it worse, caught up in cycle.   PERTINENT HISTORY:  HTN, HLD, DM  PAIN:  Are you having pain? No  PRECAUTIONS: None  WEIGHT BEARING RESTRICTIONS: No  FALLS:  Has patient fallen in last 6 months? Yes. Number of falls 1   PLOF: Independent  PATIENT GOALS: get more active again and feel better    OBJECTIVE: (objective measures from initial evaluation unless otherwise dated)  PATIENT SURVEYS:  FOTO 53% function  SCREENING FOR RED FLAGS: Bowel or bladder incontinence: No Spinal tumors: No Cauda equina syndrome: No Compression fracture: No Abdominal aneurysm: No  COGNITION: Overall cognitive status: Within functional limits for tasks assessed     SENSATION: WFL   POSTURE: rounded shoulders, forward head, decreased lumbar lordosis, anterior pelvic tilt, and flexed trunk   PALPATION: Mild ttp lumbar paraspinals, L glutes  LUMBAR ROM:   AROM eval  Flexion 0% limited  Extension 75% limited  Right lateral flexion 25% limited  Left lateral flexion 25% limited  Right rotation   Left rotation    (Blank rows = not tested) * = pain/symptoms  LOWER EXTREMITY ROM:     Active  Right eval Left eval  Hip flexion    Hip extension    Hip abduction    Hip adduction    Hip internal rotation    Hip external rotation    Knee flexion    Knee extension    Ankle dorsiflexion    Ankle plantarflexion    Ankle inversion    Ankle eversion     (Blank rows = not tested) * = pain/symptoms  LOWER  EXTREMITY MMT:    MMT Right eval Left eval Right 05/14/23 Left  05/14/23  Hip flexion 40.5 39.1    Hip extension   4/5 4/5  Hip abduction 43.0 41.4 4-/5 4/5  Hip adduction      Hip internal rotation      Hip external rotation      Knee flexion      Knee extension 73.3 66.9    Ankle dorsiflexion      Ankle plantarflexion      Ankle inversion      Ankle eversion       (Blank rows = not tested) * = pain/symptoms   FUNCTIONAL TESTS:  5 times sit to stand: 9.53 seconds without UE support, mild unsteadiness no loss of balance  GAIT: Distance walked: 80 feet Assistive device utilized: None Level of assistance: Complete Independence Comments: forward trunk, decreased foot clearance, bilateral glute weakness  TODAY'S TREATMENT:                                                                                                                              DATE:  06/04/23 Manual ER hip stretch bil Manual hip flexor stretch in sidelying Step up 6 inch 2 x 15 bilateral Lateral step up 6 inch 2 x 15 bilateral Standing hip abduction RTB  at knees 2 x 10 bilateral  06/02/23 DKTC without ball 30sec x3 LTR 5" x10ea Manual HSS Manual ER hip stretch bil [place] 2 x 10 3sec hold Sidelying hip abduction 2x10 bil Gait in hall- x73ft x2 Modified Thomas stretch 2x20sec ea Standing hip flexor stretch at railing 20sec x2ea  Hip hikes 6" step 2x10 ea HEP update  05/26/23 DKTC without ball 30sec x3 LTR 5" x10ea Manual HSS Manual ER hip stretch bil Manual hip flexor stretch in sidelying Bridge 2 x 10 3sec hold Sidelying hip abduction 2x10 R LE, sidelying clams 2x10 L LE Gait in hall- x65ft Resisted side stepping at rail RTB at ankles x2 laps Step up 6 inch 2 x 10 bilateral Lateral step up 6 inch 2 x 10 bilateral     05/17/23 DKTC with heels on green ball 10 x 5 second holds Bridge 2 x 10 STS 2 x 10  Step up 6 inch 2 x 10 bilateral Lateral step up 6 inch 1 x 10 bilateral Standing row  GTB 2 x 10 Standing shoulder extension GTB 2 x 10  Palof press GTB 2 x 10 bilateral  05/14/23 Bridge 2x 10  LTR 10 x 5 second holds Ab set 10 x 5 second holds Ab set with march 2 x 10  Standing hip abduction 2 x 10 Standing hip extension 2 x 10 Standing alternating march 2 x 10 Step up 6 inch 2 x 10  Lateral stepping 5 x 15 feet bilateral   04/30/23  EVAL and HEP   PATIENT EDUCATION:  Education details: Patient educated on exam findings, POC, scope of PT, HEP. 05/14/23: HEP Person educated: Patient Education method: Programmer, multimedia, Demonstration, and Handouts Education comprehension: verbalized understanding, returned demonstration, verbal cues required, and tactile cues required  HOME EXERCISE PROGRAM: Access Code: [email or web address] URL: [email or web address]  Date: 04/30/2023 - Standing Hip Abduction with Counter Support  - 2 x daily - 7 x weekly - 2 sets - 10 reps - Standing Hip Extension with Counter Support  - 2 x daily - 7 x weekly - 2 sets - 10 reps  05/14/23 - Supine Bridge  - 1 x daily - 7 x weekly - 2 sets - 10 reps - Supine Lower Trunk Rotation  - 1 x daily - 7 x weekly - 10 reps - 5 second hold - Abdominal Bracing  - 1 x daily - 7 x weekly - 1 sets - 10 reps - 5 second hold - Supine March  - 1 x daily - 7 x weekly - 2 sets - 10 reps - Step Up  - 1 x daily - 7 x weekly - 2 sets - 10 reps - Side Stepping with Counter Support  - 1 x daily - 7 x weekly - 4 sets - 10 reps  05/17/23 - Standing Shoulder Row with Anchored Resistance  - 1 x daily - 7 x weekly - 2 sets - 10 reps - Shoulder extension with resistance - Neutral  - 1 x daily - 7 x weekly - 2 sets - 10 reps - Standing Anti-Rotation Press with Anchored Resistance  - 1 x daily - 7 x weekly - 2 sets - 10 reps  06/04/23 - Step Up  - 1 x daily - 7 x weekly - 3 sets - 15 reps - Lateral Step Up  - 1 x daily - 7 x weekly - 3 sets - 15 reps - Sit to Stand with Arms Crossed  - 1 x daily -  7 x weekly - 3 sets - 10  reps  ASSESSMENT:  CLINICAL IMPRESSION: Began session with manual stretches with improvement in symptoms following. Continued with functional strengthening exercises which are tolerated well. Progressed as able and patient performing all exercises with good mechanics.  Patient will continue to benefit from physical therapy in order to improve function and reduce impairment.   OBJECTIVE IMPAIRMENTS: Abnormal gait, decreased activity tolerance, decreased balance, decreased endurance, decreased mobility, difficulty walking, decreased ROM, decreased strength, increased muscle spasms, impaired flexibility, improper body mechanics, postural dysfunction, and pain.   ACTIVITY LIMITATIONS: carrying, lifting, bending, standing, squatting, transfers, hygiene/grooming, locomotion level, and caring for others  PARTICIPATION LIMITATIONS: meal prep, cleaning, laundry, shopping, community activity, and yard work  PERSONAL FACTORS: Fitness, Time since onset of injury/illness/exacerbation, and 3+ comorbidities: HTN, HLD, DM, hx back pain  are also affecting patient's functional outcome.   REHAB POTENTIAL: Good  CLINICAL DECISION MAKING: Stable/uncomplicated  EVALUATION COMPLEXITY: Low   GOALS: Goals reviewed with patient? Yes  SHORT TERM GOALS: Target date: 05/28/2023    Patient will be independent with HEP in order to improve functional outcomes. Baseline:  Goal status: MET (9/30)  2.  Patient will report at least 25% improvement in symptoms for improved quality of life. Baseline:  Goal status: INITIAL    LONG TERM GOALS: Target date: 06/25/2023    Patient will report at least 75% improvement in symptoms for improved quality of life. Baseline:  Goal status: IN PROGRESS  2.  Patient will improve FOTO score by at least 9 points in order to indicate improved tolerance to activity. Baseline: 52% function Goal status: INITIAL  3.  Patient will demonstrate at least 25% improvement in lumbar  ROM in all restricted planes for improved ability to move trunk while completing chores. Baseline:  Goal status: INITIAL  4.  Patient will report improved confidence with gait, balance, and strength in roder to return to general exercise routine. Baseline:  Goal status: INITIAL  5.  Patient will demonstrate 5lb increase in muscle testing in all tested musculature as evidence of improved strength to assist with stair ambulation and gait.   Baseline: see above Goal status: INITIAL     PLAN:  PT FREQUENCY: 1-2x/week  PT DURATION: 8 weeks  PLANNED INTERVENTIONS: Therapeutic exercises, Therapeutic activity, Neuromuscular re-education, Balance training, Gait training, Patient/Family education, Joint manipulation, Joint mobilization, Stair training, Orthotic/Fit training, DME instructions, Aquatic Therapy, Dry Needling, Electrical stimulation, Spinal manipulation, Spinal mobilization, Cryotherapy, Moist heat, Compression bandaging, scar mobilization, Splintting, Taping, Traction, Ultrasound, Ionotophoresis 4mg /ml Dexamethasone, and Manual therapy  PLAN FOR NEXT SESSION: test balance, hip strength, functional strength, lumbar mobility, postural and core strength   Wyman Songster, PT 06/04/2023, 2:34 PM   Referring diagnosis? M54.42 (ICD-10-CM) - Left-sided low back pain with left-sided sciatica, unspecified chronicity Treatment diagnosis? (if different than referring diagnosis) M54.59 What was this (referring dx) caused by? []  Surgery [x]  Fall [x]  Ongoing issue []  Arthritis []  Other: ____________  Laterality: []  Rt []  Lt [x]  Both  Check all possible CPT codes:  *CHOOSE 10 OR LESS*    []  16109 (Therapeutic Exercise)  []  92507 (SLP Treatment)  []  97112 (Neuro Re-ed)   []  92526 (Swallowing Treatment)   a 60454 (Gait Training)   []  [id number] (Cognitive Training, 1st 15 minutes) []  97140 (Manual Therapy)   []  97130 (Cognitive Training, each add'l 15 minutes)  []  97164  (Re-evaluation)                              []   Other, List CPT Code ____________  []  97530 (Therapeutic Activities)     []  97535 (Self Care)   [x]  All codes above (97110 - 97535)  []  97012 (Mechanical Traction)  []  97014 (E-stim Unattended)  []  97032 (E-stim manual)  []  97033 (Ionto)  []  97035 (Ultrasound) []  97750 (Physical Performance Training) []  [id number] (Aquatic Therapy) []  97016 (Vasopneumatic Device) []  [id number] (Paraffin) []  97034 (Contrast Bath) []  97597 (Wound Care 1st 20 sq cm) []  97598 (Wound Care each add'l 20 sq cm) []  97760 (Orthotic Fabrication, Fitting, Training Initial) []  [id number] (Prosthetic Management and Training Initial) []  [id number] (Orthotic or Prosthetic Training/ Modification Subsequent)

## 2023-06-06 ENCOUNTER — Other Ambulatory Visit: Payer: Self-pay | Admitting: Family Medicine

## 2023-06-06 DIAGNOSIS — Z1212 Encounter for screening for malignant neoplasm of rectum: Secondary | ICD-10-CM

## 2023-06-06 DIAGNOSIS — Z1211 Encounter for screening for malignant neoplasm of colon: Secondary | ICD-10-CM

## 2023-06-09 ENCOUNTER — Encounter (HOSPITAL_BASED_OUTPATIENT_CLINIC_OR_DEPARTMENT_OTHER): Payer: Self-pay

## 2023-06-09 ENCOUNTER — Ambulatory Visit (HOSPITAL_BASED_OUTPATIENT_CLINIC_OR_DEPARTMENT_OTHER): Payer: Medicare PPO

## 2023-06-09 DIAGNOSIS — R2689 Other abnormalities of gait and mobility: Secondary | ICD-10-CM | POA: Diagnosis not present

## 2023-06-09 DIAGNOSIS — M5459 Other low back pain: Secondary | ICD-10-CM | POA: Diagnosis not present

## 2023-06-09 DIAGNOSIS — M6281 Muscle weakness (generalized): Secondary | ICD-10-CM | POA: Diagnosis not present

## 2023-06-09 DIAGNOSIS — R29898 Other symptoms and signs involving the musculoskeletal system: Secondary | ICD-10-CM | POA: Diagnosis not present

## 2023-06-09 NOTE — Therapy (Signed)
OUTPATIENT PHYSICAL THERAPY TREATMENT   Patient Name: Joanne Mendoza MRN: [id number] DOB:03-19-1946, 77 y.o., female Today's Date: 06/09/2023  END OF SESSION:  PT End of Session - 06/09/23 1349     Visit Number 7    Number of Visits 16    Date for PT Re-Evaluation 06/25/23    Authorization Type [employer]    Authorization - Visit Number 7    Authorization - Number of Visits 16    Progress Note Due on Visit 10    PT Start Time 1346    PT Stop Time 1432    PT Time Calculation (min) 46 min    Activity Tolerance Patient tolerated treatment well    Behavior During Therapy WFL for tasks assessed/performed                Past Medical History:  Diagnosis Date   Hyperlipidemia    Hypertension    Past Surgical History:  Procedure Laterality Date   broken arm     CESAREAN SECTION     x 2    Patient Active Problem List   Diagnosis Date Noted   Hyperlipidemia associated with type 2 diabetes mellitus (HCC) 10/02/2018   White coat hypertension 09/07/2013   Severe obesity (BMI >= 40) (HCC) 05/21/2013   Type 2 diabetes mellitus with hyperglycemia (HCC) 05/20/2013   Essential hypertension 12/12/2008   NONSPECIFIC ABN FINDING RAD & OTH EXAM GU ORGAN 12/12/2008    PCP: Kristian Covey, MD  REFERRING PROVIDER: Kristian Covey, MD  REFERRING DIAG: [phone] (ICD-10-CM) - Left-sided low back pain with left-sided sciatica, unspecified chronicity  Rationale for Evaluation and Treatment: Rehabilitation  THERAPY DIAG:  Other low back pain  Other abnormalities of gait and mobility  Muscle weakness (generalized)  Other symptoms and signs involving the musculoskeletal system  ONSET DATE: chronic  SUBJECTIVE:                                                                                                                                                                                           SUBJECTIVE STATEMENT: Pt reports constant achiness in L hip. Tried to do 5  STS each time she gets up from a chair, but this bothered her L hip more. Does report improvements in balance and strength overall.  EVAL: Patient states used to be more mobile but has decreased over last few years. Had a fall earlier this year when [place]. Some swelling and numbness in R knee but has been getting better. Some left leg issues as well. Patient states some back aches and pains. Sciatic symptoms L  hip/low back to L knee. Is afraid of falling so she limits activity which makes it worse, caught up in cycle.   PERTINENT HISTORY:  HTN, HLD, DM  PAIN:  Are you having pain? No  PRECAUTIONS: None  WEIGHT BEARING RESTRICTIONS: No  FALLS:  Has patient fallen in last 6 months? Yes. Number of falls 1   PLOF: Independent  PATIENT GOALS: get more active again and feel better    OBJECTIVE: (objective measures from initial evaluation unless otherwise dated)  PATIENT SURVEYS:  FOTO 53% function  SCREENING FOR RED FLAGS: Bowel or bladder incontinence: No Spinal tumors: No Cauda equina syndrome: No Compression fracture: No Abdominal aneurysm: No  COGNITION: Overall cognitive status: Within functional limits for tasks assessed     SENSATION: WFL   POSTURE: rounded shoulders, forward head, decreased lumbar lordosis, anterior pelvic tilt, and flexed trunk   PALPATION: Mild ttp lumbar paraspinals, L glutes  LUMBAR ROM:   AROM eval  Flexion 0% limited  Extension 75% limited  Right lateral flexion 25% limited  Left lateral flexion 25% limited  Right rotation   Left rotation    (Blank rows = not tested) * = pain/symptoms  LOWER EXTREMITY ROM:     Active  Right eval Left eval  Hip flexion    Hip extension    Hip abduction    Hip adduction    Hip internal rotation    Hip external rotation    Knee flexion    Knee extension    Ankle dorsiflexion    Ankle plantarflexion    Ankle inversion    Ankle eversion     (Blank rows = not  tested) * = pain/symptoms  LOWER EXTREMITY MMT:    MMT Right eval Left eval Right 05/14/23 Left  05/14/23  Hip flexion 40.5 39.1    Hip extension   4/5 4/5  Hip abduction 43.0 41.4 4-/5 4/5  Hip adduction      Hip internal rotation      Hip external rotation      Knee flexion      Knee extension 73.3 66.9    Ankle dorsiflexion      Ankle plantarflexion      Ankle inversion      Ankle eversion       (Blank rows = not tested) * = pain/symptoms   FUNCTIONAL TESTS:  5 times sit to stand: 9.53 seconds without UE support, mild unsteadiness no loss of balance  GAIT: Distance walked: 80 feet Assistive device utilized: None Level of assistance: Complete Independence Comments: forward trunk, decreased foot clearance, bilateral glute weakness  TODAY'S TREATMENT:                                                                                                                              DATE:   06/09/23  LTR 5" x10ea Manual HSS Manual ER hip stretch L STM to L gluteal mm Instruction in [place] 2  x 10 3sec hold Sidelying hip abduction 3x10 bil Gait in hall- x54ft x2 Modified Thomas stretch 3x30sec ea  HEP update   06/04/23 Manual ER hip stretch bil Manual hip flexor stretch in sidelying Step up 6 inch 2 x 15 bilateral Lateral step up 6 inch 2 x 15 bilateral Standing hip abduction RTB at knees 2 x 10 bilateral  06/02/23 DKTC without ball 30sec x3 LTR 5" x10ea Manual HSS Manual ER hip stretch bil Bridge 2 x 10 3sec hold Sidelying hip abduction 2x10 bil Gait in hall- x66ft x2 Modified Thomas stretch 2x20sec ea Standing hip flexor stretch at railing 20sec x2ea  Hip hikes 6" step 2x10 ea HEP update  05/26/23 DKTC without ball 30sec x3 LTR 5" x10ea Manual HSS Manual ER hip stretch bil Manual hip flexor stretch in sidelying Bridge 2 x 10 3sec hold Sidelying hip abduction 2x10 R LE, sidelying clams 2x10 L LE Gait in hall- x33ft Resisted side stepping at rail RTB at  ankles x2 laps Step up 6 inch 2 x 10 bilateral Lateral step up 6 inch 2 x 10 bilateral  PATIENT EDUCATION:  Education details: Patient educated on exam findings, POC, scope of PT, HEP. 05/14/23: HEP Person educated: Patient Education method: Programmer, multimedia, Demonstration, and Handouts Education comprehension: verbalized understanding, returned demonstration, verbal cues required, and tactile cues required  HOME EXERCISE PROGRAM: Access Code: [email or web address] URL: [email or web address]  Date: 04/30/2023 - Standing Hip Abduction with Counter Support  - 2 x daily - 7 x weekly - 2 sets - 10 reps - Standing Hip Extension with Counter Support  - 2 x daily - 7 x weekly - 2 sets - 10 reps  05/14/23 - Supine Bridge  - 1 x daily - 7 x weekly - 2 sets - 10 reps - Supine Lower Trunk Rotation  - 1 x daily - 7 x weekly - 10 reps - 5 second hold - Abdominal Bracing  - 1 x daily - 7 x weekly - 1 sets - 10 reps - 5 second hold - Supine March  - 1 x daily - 7 x weekly - 2 sets - 10 reps - Step Up  - 1 x daily - 7 x weekly - 2 sets - 10 reps - Side Stepping with Counter Support  - 1 x daily - 7 x weekly - 4 sets - 10 reps  05/17/23 - Standing Shoulder Row with Anchored Resistance  - 1 x daily - 7 x weekly - 2 sets - 10 reps - Shoulder extension with resistance - Neutral  - 1 x daily - 7 x weekly - 2 sets - 10 reps - Standing Anti-Rotation Press with Anchored Resistance  - 1 x daily - 7 x weekly - 2 sets - 10 reps  06/04/23 - Step Up  - 1 x daily - 7 x weekly - 3 sets - 15 reps - Lateral Step Up  - 1 x daily - 7 x weekly - 3 sets - 15 reps - Sit to Stand with Arms Crossed  - 1 x daily - 7 x weekly - 3 sets - 10 reps  ASSESSMENT:  CLINICAL IMPRESSION: Pt demonstrated improved L hip ER following STM to L gluteal mm. Instructed pt in self glute release using tennis ball at home. Continued to work on hip strengthening and mobility with good tolerance. Gait remains deviated with decreased hip extension and  heel contact as well as trunk sway.   OBJECTIVE IMPAIRMENTS: Abnormal gait, decreased activity tolerance, decreased  balance, decreased endurance, decreased mobility, difficulty walking, decreased ROM, decreased strength, increased muscle spasms, impaired flexibility, improper body mechanics, postural dysfunction, and pain.   ACTIVITY LIMITATIONS: carrying, lifting, bending, standing, squatting, transfers, hygiene/grooming, locomotion level, and caring for others  PARTICIPATION LIMITATIONS: meal prep, cleaning, laundry, shopping, community activity, and yard work  PERSONAL FACTORS: Fitness, Time since onset of injury/illness/exacerbation, and 3+ comorbidities: HTN, HLD, DM, hx back pain  are also affecting patient's functional outcome.   REHAB POTENTIAL: Good  CLINICAL DECISION MAKING: Stable/uncomplicated  EVALUATION COMPLEXITY: Low   GOALS: Goals reviewed with patient? Yes  SHORT TERM GOALS: Target date: 05/28/2023    Patient will be independent with HEP in order to improve functional outcomes. Baseline:  Goal status: MET (9/30)  2.  Patient will report at least 25% improvement in symptoms for improved quality of life. Baseline:  Goal status: INITIAL    LONG TERM GOALS: Target date: 06/25/2023    Patient will report at least 75% improvement in symptoms for improved quality of life. Baseline:  Goal status: IN PROGRESS  2.  Patient will improve FOTO score by at least 9 points in order to indicate improved tolerance to activity. Baseline: 52% function Goal status: INITIAL  3.  Patient will demonstrate at least 25% improvement in lumbar ROM in all restricted planes for improved ability to move trunk while completing chores. Baseline:  Goal status: INITIAL  4.  Patient will report improved confidence with gait, balance, and strength in roder to return to general exercise routine. Baseline:  Goal status: INITIAL  5.  Patient will demonstrate 5lb increase in muscle  testing in all tested musculature as evidence of improved strength to assist with stair ambulation and gait.   Baseline: see above Goal status: INITIAL     PLAN:  PT FREQUENCY: 1-2x/week  PT DURATION: 8 weeks  PLANNED INTERVENTIONS: Therapeutic exercises, Therapeutic activity, Neuromuscular re-education, Balance training, Gait training, Patient/Family education, Joint manipulation, Joint mobilization, Stair training, Orthotic/Fit training, DME instructions, Aquatic Therapy, Dry Needling, Electrical stimulation, Spinal manipulation, Spinal mobilization, Cryotherapy, Moist heat, Compression bandaging, scar mobilization, Splintting, Taping, Traction, Ultrasound, Ionotophoresis 4mg /ml Dexamethasone, and Manual therapy  PLAN FOR NEXT SESSION: test balance, hip strength, functional strength, lumbar mobility, postural and core strength   Donnel Saxon Vada Yellen, PTA 06/09/2023, 3:30 PM   Referring diagnosis? M54.42 (ICD-10-CM) - Left-sided low back pain with left-sided sciatica, unspecified chronicity Treatment diagnosis? (if different than referring diagnosis) M54.59 What was this (referring dx) caused by? []  Surgery [x]  Fall [x]  Ongoing issue []  Arthritis []  Other: ____________  Laterality: []  Rt []  Lt [x]  Both  Check all possible CPT codes:  *CHOOSE 10 OR LESS*    []  97110 (Therapeutic Exercise)  []  92507 (SLP Treatment)  []  97112 (Neuro Re-ed)   []  92526 (Swallowing Treatment)   a 25366 (Gait Training)   []  [id number] (Cognitive Training, 1st 15 minutes) []  97140 (Manual Therapy)   []  97130 (Cognitive Training, each add'l 15 minutes)  []  97164 (Re-evaluation)                              []  Other, List CPT Code ____________  []  97530 (Therapeutic Activities)     []  97535 (Self Care)   [x]  All codes above (97110 - 97535)  []  97012 (Mechanical Traction)  []  97014 (E-stim Unattended)  []  97032 (E-stim manual)  []  97033 (Ionto)  []  97035 (Ultrasound) []  97750 (Physical Performance  Training) []  [id number] (  Aquatic Therapy) []  97016 (Vasopneumatic Device) []  [id number] (Paraffin) []  97034 (Contrast Bath) []  [phone] (Wound Care 1st 20 sq cm) []  13086 (Wound Care each add'l 20 sq cm) []  97760 (Orthotic Fabrication, Fitting, Training Initial) []  [id number] (Prosthetic Management and Training Initial) []  [id number] (Orthotic or Prosthetic Training/ Modification Subsequent)

## 2023-06-11 ENCOUNTER — Encounter (HOSPITAL_BASED_OUTPATIENT_CLINIC_OR_DEPARTMENT_OTHER): Payer: Self-pay | Admitting: Physical Therapy

## 2023-06-11 ENCOUNTER — Ambulatory Visit (HOSPITAL_BASED_OUTPATIENT_CLINIC_OR_DEPARTMENT_OTHER): Payer: Medicare PPO | Admitting: Physical Therapy

## 2023-06-11 DIAGNOSIS — R29898 Other symptoms and signs involving the musculoskeletal system: Secondary | ICD-10-CM

## 2023-06-11 DIAGNOSIS — M6281 Muscle weakness (generalized): Secondary | ICD-10-CM | POA: Diagnosis not present

## 2023-06-11 DIAGNOSIS — M5459 Other low back pain: Secondary | ICD-10-CM | POA: Diagnosis not present

## 2023-06-11 DIAGNOSIS — R2689 Other abnormalities of gait and mobility: Secondary | ICD-10-CM

## 2023-06-11 NOTE — Therapy (Signed)
OUTPATIENT PHYSICAL THERAPY TREATMENT   Patient Name: Joanne Mendoza MRN: [id number] DOB:November 23, 1945, 77 y.o., female Today's Date: 06/11/2023  END OF SESSION:  PT End of Session - 06/11/23 1434     Visit Number 8    Number of Visits 16    Date for PT Re-Evaluation 06/25/23    Authorization Type [employer]    Authorization - Visit Number 8    Authorization - Number of Visits 16    Progress Note Due on Visit 10    PT Start Time 1434    PT Stop Time 1515    PT Time Calculation (min) 41 min    Activity Tolerance Patient tolerated treatment well    Behavior During Therapy WFL for tasks assessed/performed                Past Medical History:  Diagnosis Date   Hyperlipidemia    Hypertension    Past Surgical History:  Procedure Laterality Date   broken arm     CESAREAN SECTION     x 2    Patient Active Problem List   Diagnosis Date Noted   Hyperlipidemia associated with type 2 diabetes mellitus (HCC) 10/02/2018   White coat hypertension 09/07/2013   Severe obesity (BMI >= 40) (HCC) 05/21/2013   Type 2 diabetes mellitus with hyperglycemia (HCC) 05/20/2013   Essential hypertension 12/12/2008   NONSPECIFIC ABN FINDING RAD & OTH EXAM GU ORGAN 12/12/2008    PCP: Kristian Covey, MD  REFERRING PROVIDER: Kristian Covey, MD  REFERRING DIAG: [phone] (ICD-10-CM) - Left-sided low back pain with left-sided sciatica, unspecified chronicity  Rationale for Evaluation and Treatment: Rehabilitation  THERAPY DIAG:  Other low back pain  Other abnormalities of gait and mobility  Muscle weakness (generalized)  Other symptoms and signs involving the musculoskeletal system  ONSET DATE: chronic  SUBJECTIVE:                                                                                                                                                                                           SUBJECTIVE STATEMENT: Pt reports L hip sore today, can feel. HEP going  well.  EVAL: Patient states used to be more mobile but has decreased over last few years. Had a fall earlier this year when walking [place]. Some swelling and numbness in R knee but has been getting better. Some left leg issues as well. Patient states some back aches and pains. Sciatic symptoms L hip/low back to L knee. Is afraid of falling so she limits activity which makes it worse, caught up in cycle.   PERTINENT  HISTORY:  HTN, HLD, DM  PAIN:  Are you having pain? No  PRECAUTIONS: None  WEIGHT BEARING RESTRICTIONS: No  FALLS:  Has patient fallen in last 6 months? Yes. Number of falls 1   PLOF: Independent  PATIENT GOALS: get more active again and feel better    OBJECTIVE: (objective measures from initial evaluation unless otherwise dated)  PATIENT SURVEYS:  FOTO 53% function  SCREENING FOR RED FLAGS: Bowel or bladder incontinence: No Spinal tumors: No Cauda equina syndrome: No Compression fracture: No Abdominal aneurysm: No  COGNITION: Overall cognitive status: Within functional limits for tasks assessed     SENSATION: WFL   POSTURE: rounded shoulders, forward head, decreased lumbar lordosis, anterior pelvic tilt, and flexed trunk   PALPATION: Mild ttp lumbar paraspinals, L glutes  LUMBAR ROM:   AROM eval  Flexion 0% limited  Extension 75% limited  Right lateral flexion 25% limited  Left lateral flexion 25% limited  Right rotation   Left rotation    (Blank rows = not tested) * = pain/symptoms  LOWER EXTREMITY ROM:     Active  Right eval Left eval  Hip flexion    Hip extension    Hip abduction    Hip adduction    Hip internal rotation    Hip external rotation    Knee flexion    Knee extension    Ankle dorsiflexion    Ankle plantarflexion    Ankle inversion    Ankle eversion     (Blank rows = not tested) * = pain/symptoms  LOWER EXTREMITY MMT:    MMT Right eval Left eval Right 05/14/23 Left  05/14/23  Hip flexion  40.5 39.1    Hip extension   4/5 4/5  Hip abduction 43.0 41.4 4-/5 4/5  Hip adduction      Hip internal rotation      Hip external rotation      Knee flexion      Knee extension 73.3 66.9    Ankle dorsiflexion      Ankle plantarflexion      Ankle inversion      Ankle eversion       (Blank rows = not tested) * = pain/symptoms   FUNCTIONAL TESTS:  5 times sit to stand: 9.53 seconds without UE support, mild unsteadiness no loss of balance  GAIT: Distance walked: 80 feet Assistive device utilized: None Level of assistance: Complete Independence Comments: forward trunk, decreased foot clearance, bilateral glute weakness  TODAY'S TREATMENT:                                                                                                                              DATE:  06/11/23 Manual HSS and education on how to perform at home Prone hip extension 2 x 10 bilateral  Bridge with clam RTB 2 x 10 Hip hikes 6" step 2x10 ea  Resisted retro gait 10# 1 x 10    06/09/23  LTR  5" x10ea Manual HSS Manual ER hip stretch L STM to L gluteal mm Instruction in Bridge 2 x 10 3sec hold Sidelying hip abduction 3x10 bil Gait in hall- x68ft x2 Modified Thomas stretch 3x30sec ea  HEP update   06/04/23 Manual ER hip stretch bil Manual hip flexor stretch in sidelying Step up 6 inch 2 x 15 bilateral Lateral step up 6 inch 2 x 15 bilateral Standing hip abduction RTB at knees 2 x 10 bilateral Hip hikes 6" step 2x10 ea   06/02/23 DKTC without ball 30sec x3 LTR 5" x10ea Manual HSS Manual ER hip stretch bil Bridge 2 x 10 3sec hold Sidelying hip abduction 2x10 bil Gait in hall- x87ft x2 Modified Thomas stretch 2x20sec ea Standing hip flexor stretch at railing 20sec x2ea  Hip hikes 6" step 2x10 ea HEP update  05/26/23 DKTC without ball 30sec x3 LTR 5" x10ea Manual HSS Manual ER hip stretch bil Manual hip flexor stretch in sidelying Bridge 2 x 10 3sec hold Sidelying hip abduction  2x10 R LE, sidelying clams 2x10 L LE Gait in hall- x52ft Resisted side stepping at rail RTB at ankles x2 laps Step up 6 inch 2 x 10 bilateral Lateral step up 6 inch 2 x 10 bilateral  PATIENT EDUCATION:  Education details: Patient educated on exam findings, POC, scope of PT, HEP. 05/14/23: HEP Person educated: Patient Education method: Programmer, multimedia, Demonstration, and Handouts Education comprehension: verbalized understanding, returned demonstration, verbal cues required, and tactile cues required  HOME EXERCISE PROGRAM: Access Code: [email or web address] URL: [email or web address]  Date: 04/30/2023 - Standing Hip Abduction with Counter Support  - 2 x daily - 7 x weekly - 2 sets - 10 reps - Standing Hip Extension with Counter Support  - 2 x daily - 7 x weekly - 2 sets - 10 reps  05/14/23 - Supine Bridge  - 1 x daily - 7 x weekly - 2 sets - 10 reps - Supine Lower Trunk Rotation  - 1 x daily - 7 x weekly - 10 reps - 5 second hold - Abdominal Bracing  - 1 x daily - 7 x weekly - 1 sets - 10 reps - 5 second hold - Supine March  - 1 x daily - 7 x weekly - 2 sets - 10 reps - Step Up  - 1 x daily - 7 x weekly - 2 sets - 10 reps - Side Stepping with Counter Support  - 1 x daily - 7 x weekly - 4 sets - 10 reps  05/17/23 - Standing Shoulder Row with Anchored Resistance  - 1 x daily - 7 x weekly - 2 sets - 10 reps - Shoulder extension with resistance - Neutral  - 1 x daily - 7 x weekly - 2 sets - 10 reps - Standing Anti-Rotation Press with Anchored Resistance  - 1 x daily - 7 x weekly - 2 sets - 10 reps  06/04/23 - Step Up  - 1 x daily - 7 x weekly - 3 sets - 15 reps - Lateral Step Up  - 1 x daily - 7 x weekly - 3 sets - 15 reps - Sit to Stand with Arms Crossed  - 1 x daily - 7 x weekly - 3 sets - 10 reps  ASSESSMENT:  CLINICAL IMPRESSION: Pt shown how to perform HS stretch at home. Continued with glute strengthening which is tolerated well. CGA given for balance with weighted gait with mild  unsteadiness. Patient will continue to benefit from physical  therapy in order to improve function and reduce impairment.   OBJECTIVE IMPAIRMENTS: Abnormal gait, decreased activity tolerance, decreased balance, decreased endurance, decreased mobility, difficulty walking, decreased ROM, decreased strength, increased muscle spasms, impaired flexibility, improper body mechanics, postural dysfunction, and pain.   ACTIVITY LIMITATIONS: carrying, lifting, bending, standing, squatting, transfers, hygiene/grooming, locomotion level, and caring for others  PARTICIPATION LIMITATIONS: meal prep, cleaning, laundry, shopping, community activity, and yard work  PERSONAL FACTORS: Fitness, Time since onset of injury/illness/exacerbation, and 3+ comorbidities: HTN, HLD, DM, hx back pain  are also affecting patient's functional outcome.   REHAB POTENTIAL: Good  CLINICAL DECISION MAKING: Stable/uncomplicated  EVALUATION COMPLEXITY: Low   GOALS: Goals reviewed with patient? Yes  SHORT TERM GOALS: Target date: 05/28/2023    Patient will be independent with HEP in order to improve functional outcomes. Baseline:  Goal status: MET (9/30)  2.  Patient will report at least 25% improvement in symptoms for improved quality of life. Baseline:  Goal status: INITIAL    LONG TERM GOALS: Target date: 06/25/2023    Patient will report at least 75% improvement in symptoms for improved quality of life. Baseline:  Goal status: IN PROGRESS  2.  Patient will improve FOTO score by at least 9 points in order to indicate improved tolerance to activity. Baseline: 52% function Goal status: INITIAL  3.  Patient will demonstrate at least 25% improvement in lumbar ROM in all restricted planes for improved ability to move trunk while completing chores. Baseline:  Goal status: INITIAL  4.  Patient will report improved confidence with gait, balance, and strength in roder to return to general exercise routine. Baseline:   Goal status: INITIAL  5.  Patient will demonstrate 5lb increase in muscle testing in all tested musculature as evidence of improved strength to assist with stair ambulation and gait.   Baseline: see above Goal status: INITIAL     PLAN:  PT FREQUENCY: 1-2x/week  PT DURATION: 8 weeks  PLANNED INTERVENTIONS: Therapeutic exercises, Therapeutic activity, Neuromuscular re-education, Balance training, Gait training, Patient/Family education, Joint manipulation, Joint mobilization, Stair training, Orthotic/Fit training, DME instructions, Aquatic Therapy, Dry Needling, Electrical stimulation, Spinal manipulation, Spinal mobilization, Cryotherapy, Moist heat, Compression bandaging, scar mobilization, Splintting, Taping, Traction, Ultrasound, Ionotophoresis 4mg /ml Dexamethasone, and Manual therapy  PLAN FOR NEXT SESSION: test balance, hip strength, functional strength, lumbar mobility, postural and core strength   Wyman Songster, PT 06/11/2023, 2:35 PM   Referring diagnosis? M54.42 (ICD-10-CM) - Left-sided low back pain with left-sided sciatica, unspecified chronicity Treatment diagnosis? (if different than referring diagnosis) M54.59 What was this (referring dx) caused by? []  Surgery [x]  Fall [x]  Ongoing issue []  Arthritis []  Other: ____________  Laterality: []  Rt []  Lt [x]  Both  Check all possible CPT codes:  *CHOOSE 10 OR LESS*    []  97110 (Therapeutic Exercise)  []  92507 (SLP Treatment)  []  97112 (Neuro Re-ed)   []  92526 (Swallowing Treatment)   a [id number] (Gait Training)   []  [id number] (Cognitive Training, 1st 15 minutes) []  97140 (Manual Therapy)   []  97130 (Cognitive Training, each add'l 15 minutes)  []  97164 (Re-evaluation)                              []  Other, List CPT Code ____________  []  97530 (Therapeutic Activities)     []  97535 (Self Care)   [x]  All codes above (97110 - 97535)  []  97012 (Mechanical Traction)  []  97014 (E-stim Unattended)  []   54098 (E-stim  manual)  []  97033 (Ionto)  []  [phone] (Ultrasound) []  97750 (Physical Performance Training) []  [phone] (Aquatic Therapy) []  97016 (Vasopneumatic Device) []  [id number] (Paraffin) []  97034 (Contrast Bath) []  [phone] (Wound Care 1st 20 sq cm) []  97598 (Wound Care each add'l 20 sq cm) []  97760 (Orthotic Fabrication, Fitting, Training Initial) []  [id number] (Prosthetic Management and Training Initial) []  [id number] (Orthotic or Prosthetic Training/ Modification Subsequent)

## 2023-06-16 ENCOUNTER — Encounter (HOSPITAL_BASED_OUTPATIENT_CLINIC_OR_DEPARTMENT_OTHER): Payer: Self-pay

## 2023-06-16 ENCOUNTER — Ambulatory Visit (HOSPITAL_BASED_OUTPATIENT_CLINIC_OR_DEPARTMENT_OTHER): Payer: Medicare PPO

## 2023-06-16 DIAGNOSIS — M5459 Other low back pain: Secondary | ICD-10-CM | POA: Diagnosis not present

## 2023-06-16 DIAGNOSIS — M6281 Muscle weakness (generalized): Secondary | ICD-10-CM

## 2023-06-16 DIAGNOSIS — R2689 Other abnormalities of gait and mobility: Secondary | ICD-10-CM

## 2023-06-16 DIAGNOSIS — R29898 Other symptoms and signs involving the musculoskeletal system: Secondary | ICD-10-CM | POA: Diagnosis not present

## 2023-06-16 NOTE — Therapy (Signed)
OUTPATIENT PHYSICAL THERAPY TREATMENT   Patient Name: Joanne Mendoza MRN: [id number] DOB:12-22-1945, 77 y.o., female Today's Date: 06/16/2023  END OF SESSION:  PT End of Session - 06/16/23 1436     Visit Number 9    Number of Visits 16    Date for PT Re-Evaluation 06/25/23    Authorization Type [employer]    Authorization - Visit Number 9    Authorization - Number of Visits 16    Progress Note Due on Visit 10    PT Start Time 1355    PT Stop Time 1433    PT Time Calculation (min) 38 min    Activity Tolerance Patient tolerated treatment well    Behavior During Therapy WFL for tasks assessed/performed                 Past Medical History:  Diagnosis Date   Hyperlipidemia    Hypertension    Past Surgical History:  Procedure Laterality Date   broken arm     CESAREAN SECTION     x 2    Patient Active Problem List   Diagnosis Date Noted   Hyperlipidemia associated with type 2 diabetes mellitus (HCC) 10/02/2018   White coat hypertension 09/07/2013   Severe obesity (BMI >= 40) (HCC) 05/21/2013   Type 2 diabetes mellitus with hyperglycemia (HCC) 05/20/2013   Essential hypertension 12/12/2008   NONSPECIFIC ABN FINDING RAD & OTH EXAM GU ORGAN 12/12/2008    PCP: Kristian Covey, MD  REFERRING PROVIDER: Kristian Covey, MD  REFERRING DIAG: [phone] (ICD-10-CM) - Left-sided low back pain with left-sided sciatica, unspecified chronicity  Rationale for Evaluation and Treatment: Rehabilitation  THERAPY DIAG:  Other low back pain  Other symptoms and signs involving the musculoskeletal system  Muscle weakness (generalized)  Other abnormalities of gait and mobility  ONSET DATE: chronic  SUBJECTIVE:                                                                                                                                                                                           SUBJECTIVE STATEMENT: Pt reports her hip hurts depending on her  activity. Feels benefit from PT.   EVAL: Patient states used to be more mobile but has decreased over last few years. Had a fall earlier this year when walking [place]. Some swelling and numbness in R knee but has been getting better. Some left leg issues as well. Patient states some back aches and pains. Sciatic symptoms L hip/low back to L knee. Is afraid of falling so she limits activity which makes it worse, caught up in  cycle.   PERTINENT HISTORY:  HTN, HLD, DM  PAIN:  Are you having pain? No  PRECAUTIONS: None  WEIGHT BEARING RESTRICTIONS: No  FALLS:  Has patient fallen in last 6 months? Yes. Number of falls 1   PLOF: Independent  PATIENT GOALS: get more active again and feel better    OBJECTIVE: (objective measures from initial evaluation unless otherwise dated)  PATIENT SURVEYS:  FOTO 53% function  SCREENING FOR RED FLAGS: Bowel or bladder incontinence: No Spinal tumors: No Cauda equina syndrome: No Compression fracture: No Abdominal aneurysm: No  COGNITION: Overall cognitive status: Within functional limits for tasks assessed     SENSATION: WFL   POSTURE: rounded shoulders, forward head, decreased lumbar lordosis, anterior pelvic tilt, and flexed trunk   PALPATION: Mild ttp lumbar paraspinals, L glutes  LUMBAR ROM:   AROM eval  Flexion 0% limited  Extension 75% limited  Right lateral flexion 25% limited  Left lateral flexion 25% limited  Right rotation   Left rotation    (Blank rows = not tested) * = pain/symptoms  LOWER EXTREMITY ROM:     Active  Right eval Left eval  Hip flexion    Hip extension    Hip abduction    Hip adduction    Hip internal rotation    Hip external rotation    Knee flexion    Knee extension    Ankle dorsiflexion    Ankle plantarflexion    Ankle inversion    Ankle eversion     (Blank rows = not tested) * = pain/symptoms  LOWER EXTREMITY MMT:    MMT Right eval Left eval Right 05/14/23  Left  05/14/23  Hip flexion 40.5 39.1    Hip extension   4/5 4/5  Hip abduction 43.0 41.4 4-/5 4/5  Hip adduction      Hip internal rotation      Hip external rotation      Knee flexion      Knee extension 73.3 66.9    Ankle dorsiflexion      Ankle plantarflexion      Ankle inversion      Ankle eversion       (Blank rows = not tested) * = pain/symptoms   FUNCTIONAL TESTS:  5 times sit to stand: 9.53 seconds without UE support, mild unsteadiness no loss of balance  GAIT: Distance walked: 80 feet Assistive device utilized: None Level of assistance: Complete Independence Comments: forward trunk, decreased foot clearance, bilateral glute weakness  TODAY'S TREATMENT:                                                                                                                              DATE:   06/16/23 Manual HSS LTR 5" x5ea Bridge with clam RTB 2 x 10 Hip hikes 6" step 2x10 ea  Standing hip abduction/ext RTB at ankles 2 x 10 bilateral SLS without hip drop 2x10sc ea (fingertip support)  06/11/23 Manual  HSS and education on how to perform at home Prone hip extension 2 x 10 bilateral  Bridge with clam RTB 2 x 10 Hip hikes 6" step 2x10 ea  Resisted retro gait 10# 1 x 10    06/09/23  LTR 5" x10ea Manual HSS Manual ER hip stretch L STM to L gluteal mm Instruction in Bridge 2 x 10 3sec hold Sidelying hip abduction 3x10 bil Gait in hall- x10ft x2 Modified Thomas stretch 3x30sec ea  HEP update   06/04/23 Manual ER hip stretch bil Manual hip flexor stretch in sidelying Step up 6 inch 2 x 15 bilateral Lateral step up 6 inch 2 x 15 bilateral Standing hip abduction RTB at knees 2 x 10 bilateral Hip hikes 6" step 2x10 ea   06/02/23 DKTC without ball 30sec x3 LTR 5" x10ea Manual HSS Manual ER hip stretch bil Bridge 2 x 10 3sec hold Sidelying hip abduction 2x10 bil Gait in hall- x35ft x2 Modified Thomas stretch 2x20sec ea Standing hip flexor stretch at  railing 20sec x2ea  Hip hikes 6" step 2x10 ea HEP update  05/26/23 DKTC without ball 30sec x3 LTR 5" x10ea Manual HSS Manual ER hip stretch bil Manual hip flexor stretch in sidelying Bridge 2 x 10 3sec hold Sidelying hip abduction 2x10 R LE, sidelying clams 2x10 L LE Gait in hall- x43ft Resisted side stepping at rail RTB at ankles x2 laps Step up 6 inch 2 x 10 bilateral Lateral step up 6 inch 2 x 10 bilateral  PATIENT EDUCATION:  Education details: Patient educated on exam findings, POC, scope of PT, HEP. 05/14/23: HEP Person educated: Patient Education method: Programmer, multimedia, Demonstration, and Handouts Education comprehension: verbalized understanding, returned demonstration, verbal cues required, and tactile cues required  HOME EXERCISE PROGRAM: Access Code: [email or web address] URL: [email or web address]  Date: 04/30/2023 - Standing Hip Abduction with Counter Support  - 2 x daily - 7 x weekly - 2 sets - 10 reps - Standing Hip Extension with Counter Support  - 2 x daily - 7 x weekly - 2 sets - 10 reps  05/14/23 - Supine Bridge  - 1 x daily - 7 x weekly - 2 sets - 10 reps - Supine Lower Trunk Rotation  - 1 x daily - 7 x weekly - 10 reps - 5 second hold - Abdominal Bracing  - 1 x daily - 7 x weekly - 1 sets - 10 reps - 5 second hold - Supine March  - 1 x daily - 7 x weekly - 2 sets - 10 reps - Step Up  - 1 x daily - 7 x weekly - 2 sets - 10 reps - Side Stepping with Counter Support  - 1 x daily - 7 x weekly - 4 sets - 10 reps  05/17/23 - Standing Shoulder Row with Anchored Resistance  - 1 x daily - 7 x weekly - 2 sets - 10 reps - Shoulder extension with resistance - Neutral  - 1 x daily - 7 x weekly - 2 sets - 10 reps - Standing Anti-Rotation Press with Anchored Resistance  - 1 x daily - 7 x weekly - 2 sets - 10 reps  06/04/23 - Step Up  - 1 x daily - 7 x weekly - 3 sets - 15 reps - Lateral Step Up  - 1 x daily - 7 x weekly - 3 sets - 15 reps - Sit to Stand with Arms Crossed   - 1 x daily - 7 x weekly -  3 sets - 10 reps  ASSESSMENT:  CLINICAL IMPRESSION: Pt demonstrated improved trunk and pelvic control with gait. Does require cues for proper hip hiking technique off step. She remains tight bilaterally in piriformis, so spent time on manual stretch for this. Able to hold SLS without trendelenburg occurrence with fingertip assist on railing. Will continue to work on improving functional strength.  OBJECTIVE IMPAIRMENTS: Abnormal gait, decreased activity tolerance, decreased balance, decreased endurance, decreased mobility, difficulty walking, decreased ROM, decreased strength, increased muscle spasms, impaired flexibility, improper body mechanics, postural dysfunction, and pain.   ACTIVITY LIMITATIONS: carrying, lifting, bending, standing, squatting, transfers, hygiene/grooming, locomotion level, and caring for others  PARTICIPATION LIMITATIONS: meal prep, cleaning, laundry, shopping, community activity, and yard work  PERSONAL FACTORS: Fitness, Time since onset of injury/illness/exacerbation, and 3+ comorbidities: HTN, HLD, DM, hx back pain  are also affecting patient's functional outcome.   REHAB POTENTIAL: Good  CLINICAL DECISION MAKING: Stable/uncomplicated  EVALUATION COMPLEXITY: Low   GOALS: Goals reviewed with patient? Yes  SHORT TERM GOALS: Target date: 05/28/2023    Patient will be independent with HEP in order to improve functional outcomes. Baseline:  Goal status: MET (9/30)  2.  Patient will report at least 25% improvement in symptoms for improved quality of life. Baseline:  Goal status: INITIAL    LONG TERM GOALS: Target date: 06/25/2023    Patient will report at least 75% improvement in symptoms for improved quality of life. Baseline:  Goal status: IN PROGRESS  2.  Patient will improve FOTO score by at least 9 points in order to indicate improved tolerance to activity. Baseline: 52% function Goal status: INITIAL  3.  Patient  will demonstrate at least 25% improvement in lumbar ROM in all restricted planes for improved ability to move trunk while completing chores. Baseline:  Goal status: INITIAL  4.  Patient will report improved confidence with gait, balance, and strength in roder to return to general exercise routine. Baseline:  Goal status: INITIAL  5.  Patient will demonstrate 5lb increase in muscle testing in all tested musculature as evidence of improved strength to assist with stair ambulation and gait.   Baseline: see above Goal status: INITIAL     PLAN:  PT FREQUENCY: 1-2x/week  PT DURATION: 8 weeks  PLANNED INTERVENTIONS: Therapeutic exercises, Therapeutic activity, Neuromuscular re-education, Balance training, Gait training, Patient/Family education, Joint manipulation, Joint mobilization, Stair training, Orthotic/Fit training, DME instructions, Aquatic Therapy, Dry Needling, Electrical stimulation, Spinal manipulation, Spinal mobilization, Cryotherapy, Moist heat, Compression bandaging, scar mobilization, Splintting, Taping, Traction, Ultrasound, Ionotophoresis 4mg /ml Dexamethasone, and Manual therapy  PLAN FOR NEXT SESSION: test balance, hip strength, functional strength, lumbar mobility, postural and core strength   Donnel Saxon Shawntel Farnworth, PTA 06/16/2023, 3:53 PM   Referring diagnosis? M54.42 (ICD-10-CM) - Left-sided low back pain with left-sided sciatica, unspecified chronicity Treatment diagnosis? (if different than referring diagnosis) M54.59 What was this (referring dx) caused by? []  Surgery [x]  Fall [x]  Ongoing issue []  Arthritis []  Other: ____________  Laterality: []  Rt []  Lt [x]  Both  Check all possible CPT codes:  *CHOOSE 10 OR LESS*    []  97110 (Therapeutic Exercise)  []  16109 (SLP Treatment)  []  [id number] (Neuro Re-ed)   []  92526 (Swallowing Treatment)   a [id number] (Gait Training)   []  [id number] (Cognitive Training, 1st 15 minutes) []  97140 (Manual Therapy)   []  97130 (Cognitive  Training, each add'l 15 minutes)  []  97164 (Re-evaluation)                              []   Other, List CPT Code ____________  []  97530 (Therapeutic Activities)     []  97535 (Self Care)   [x]  All codes above (97110 - 97535)  []  97012 (Mechanical Traction)  []  97014 (E-stim Unattended)  []  97032 (E-stim manual)  []  97033 (Ionto)  []  97035 (Ultrasound) []  97750 (Physical Performance Training) []  [id number] (Aquatic Therapy) []  97016 (Vasopneumatic Device) []  [id number] (Paraffin) []  97034 (Contrast Bath) []  97597 (Wound Care 1st 20 sq cm) []  97598 (Wound Care each add'l 20 sq cm) []  97760 (Orthotic Fabrication, Fitting, Training Initial) []  [id number] (Prosthetic Management and Training Initial) []  [id number] (Orthotic or Prosthetic Training/ Modification Subsequent)

## 2023-06-18 ENCOUNTER — Ambulatory Visit (HOSPITAL_BASED_OUTPATIENT_CLINIC_OR_DEPARTMENT_OTHER): Payer: Medicare PPO | Admitting: Physical Therapy

## 2023-06-18 ENCOUNTER — Encounter (HOSPITAL_BASED_OUTPATIENT_CLINIC_OR_DEPARTMENT_OTHER): Payer: Self-pay | Admitting: Physical Therapy

## 2023-06-18 DIAGNOSIS — R29898 Other symptoms and signs involving the musculoskeletal system: Secondary | ICD-10-CM | POA: Diagnosis not present

## 2023-06-18 DIAGNOSIS — R2689 Other abnormalities of gait and mobility: Secondary | ICD-10-CM

## 2023-06-18 DIAGNOSIS — M6281 Muscle weakness (generalized): Secondary | ICD-10-CM

## 2023-06-18 DIAGNOSIS — M5459 Other low back pain: Secondary | ICD-10-CM | POA: Diagnosis not present

## 2023-06-18 NOTE — Therapy (Signed)
OUTPATIENT PHYSICAL THERAPY TREATMENT   Patient Name: Joanne Mendoza MRN: [id number] DOB:Apr 15, 1946, 77 y.o., female Today's Date: 06/18/2023  Progress Note   Reporting Period 04/30/23 to 06/18/23   See note below for Objective Data and Assessment of Progress/Goals   END OF SESSION:  PT End of Session - 06/18/23 1353     Visit Number 10    Number of Visits 32    Date for PT Re-Evaluation 08/13/23    Authorization Type [employer]    Authorization - Visit Number 10    Authorization - Number of Visits 16    Progress Note Due on Visit 20    PT Start Time 1352    PT Stop Time 1432    PT Time Calculation (min) 40 min    Activity Tolerance Patient tolerated treatment well    Behavior During Therapy WFL for tasks assessed/performed                 Past Medical History:  Diagnosis Date   Hyperlipidemia    Hypertension    Past Surgical History:  Procedure Laterality Date   broken arm     CESAREAN SECTION     x 2    Patient Active Problem List   Diagnosis Date Noted   Hyperlipidemia associated with type 2 diabetes mellitus (HCC) 10/02/2018   White coat hypertension 09/07/2013   Severe obesity (BMI >= 40) (HCC) 05/21/2013   Type 2 diabetes mellitus with hyperglycemia (HCC) 05/20/2013   Essential hypertension 12/12/2008   NONSPECIFIC ABN FINDING RAD & OTH EXAM GU ORGAN 12/12/2008    PCP: Kristian Covey, MD  REFERRING PROVIDER: Kristian Covey, MD  REFERRING DIAG: [phone] (ICD-10-CM) - Left-sided low back pain with left-sided sciatica, unspecified chronicity  Rationale for Evaluation and Treatment: Rehabilitation  THERAPY DIAG:  Other low back pain  Other symptoms and signs involving the musculoskeletal system  Muscle weakness (generalized)  Other abnormalities of gait and mobility  ONSET DATE: chronic  SUBJECTIVE:                                                                                                                                                                                            SUBJECTIVE STATEMENT: Pt reports a 30-40% improvement since beginning PT with improved strength and balance. Patient states a lot of achy pain, stiffness. Stiffness in back/hips. Trouble crossing leg over. She would like to continue PT.   EVAL: Patient states used to be more mobile but has decreased over last few years. Had a fall earlier this year when walking [place]. Some swelling and numbness  in R knee but has been getting better. Some left leg issues as well. Patient states some back aches and pains. Sciatic symptoms L hip/low back to L knee. Is afraid of falling so she limits activity which makes it worse, caught up in cycle.   PERTINENT HISTORY:  HTN, HLD, DM  PAIN:  Are you having pain? No  PRECAUTIONS: None  WEIGHT BEARING RESTRICTIONS: No  FALLS:  Has patient fallen in last 6 months? Yes. Number of falls 1   PLOF: Independent  PATIENT GOALS: get more active again and feel better    OBJECTIVE: (objective measures from initial evaluation unless otherwise dated)  PATIENT SURVEYS:  FOTO 53% function 06/18/23: 53% function  SCREENING FOR RED FLAGS: Bowel or bladder incontinence: No Spinal tumors: No Cauda equina syndrome: No Compression fracture: No Abdominal aneurysm: No  COGNITION: Overall cognitive status: Within functional limits for tasks assessed     SENSATION: WFL   POSTURE: rounded shoulders, forward head, decreased lumbar lordosis, anterior pelvic tilt, and flexed trunk   PALPATION: Mild ttp lumbar paraspinals, L glutes  LUMBAR ROM:   AROM eval 06/18/23  Flexion 0% limited 0% limited  Extension 75% limited 50% limited  Right lateral flexion 25% limited 0% limited  Left lateral flexion 25% limited 0% limited  Right rotation    Left rotation     (Blank rows = not tested) * = pain/symptoms  LOWER EXTREMITY ROM:     Active  Right eval Left eval  Hip flexion    Hip  extension    Hip abduction    Hip adduction    Hip internal rotation    Hip external rotation    Knee flexion    Knee extension    Ankle dorsiflexion    Ankle plantarflexion    Ankle inversion    Ankle eversion     (Blank rows = not tested) * = pain/symptoms  LOWER EXTREMITY MMT:    MMT Right eval Left eval Right 05/14/23 Left  05/14/23 Right 10/16 Left 06/18/23  Hip flexion 40.5 39.1   55.4 54.1  Hip extension   4/5 4/5 4/5 4/5  Hip abduction 43.0 41.4 4-/5 4/5 72.9/ 4-/5 60.5/ 4-/5  Hip adduction        Hip internal rotation        Hip external rotation        Knee flexion        Knee extension 73.3 66.9   82 68.3  Ankle dorsiflexion        Ankle plantarflexion        Ankle inversion        Ankle eversion         (Blank rows = not tested) * = pain/symptoms   FUNCTIONAL TESTS:  5 times sit to stand: 9.53 seconds without UE support, mild unsteadiness no loss of balance  GAIT: Distance walked: 80 feet Assistive device utilized: None Level of assistance: Complete Independence Comments: forward trunk, decreased foot clearance, bilateral glute weakness  TODAY'S TREATMENT:  DATE:  06/18/23 Reassessment    06/16/23 Manual HSS LTR 5" x5ea Bridge with clam RTB 2 x 10 Hip hikes 6" step 2x10 ea  Standing hip abduction/ext RTB at ankles 2 x 10 bilateral SLS without hip drop 2x10sc ea (fingertip support)  06/11/23 Manual HSS and education on how to perform at home Prone hip extension 2 x 10 bilateral  Bridge with clam RTB 2 x 10 Hip hikes 6" step 2x10 ea  Resisted retro gait 10# 1 x 10    06/09/23  LTR 5" x10ea Manual HSS Manual ER hip stretch L STM to L gluteal mm Instruction in Bridge 2 x 10 3sec hold Sidelying hip abduction 3x10 bil Gait in hall- x76ft x2 Modified Thomas stretch 3x30sec ea  HEP update   06/04/23 Manual ER  hip stretch bil Manual hip flexor stretch in sidelying Step up 6 inch 2 x 15 bilateral Lateral step up 6 inch 2 x 15 bilateral Standing hip abduction RTB at knees 2 x 10 bilateral Hip hikes 6" step 2x10 ea   06/02/23 DKTC without ball 30sec x3 LTR 5" x10ea Manual HSS Manual ER hip stretch bil Bridge 2 x 10 3sec hold Sidelying hip abduction 2x10 bil Gait in hall- x83ft x2 Modified Thomas stretch 2x20sec ea Standing hip flexor stretch at railing 20sec x2ea  Hip hikes 6" step 2x10 ea HEP update  05/26/23 DKTC without ball 30sec x3 LTR 5" x10ea Manual HSS Manual ER hip stretch bil Manual hip flexor stretch in sidelying Bridge 2 x 10 3sec hold Sidelying hip abduction 2x10 R LE, sidelying clams 2x10 L LE Gait in hall- x73ft Resisted side stepping at rail RTB at ankles x2 laps Step up 6 inch 2 x 10 bilateral Lateral step up 6 inch 2 x 10 bilateral  PATIENT EDUCATION:  Education details: Patient educated on exam findings, POC, scope of PT, HEP. 05/14/23: HEP Person educated: Patient Education method: Programmer, multimedia, Demonstration, and Handouts Education comprehension: verbalized understanding, returned demonstration, verbal cues required, and tactile cues required  HOME EXERCISE PROGRAM: Access Code: [email or web address] URL: [email or web address]  Date: 04/30/2023 - Standing Hip Abduction with Counter Support  - 2 x daily - 7 x weekly - 2 sets - 10 reps - Standing Hip Extension with Counter Support  - 2 x daily - 7 x weekly - 2 sets - 10 reps  05/14/23 - Supine Bridge  - 1 x daily - 7 x weekly - 2 sets - 10 reps - Supine Lower Trunk Rotation  - 1 x daily - 7 x weekly - 10 reps - 5 second hold - Abdominal Bracing  - 1 x daily - 7 x weekly - 1 sets - 10 reps - 5 second hold - Supine March  - 1 x daily - 7 x weekly - 2 sets - 10 reps - Step Up  - 1 x daily - 7 x weekly - 2 sets - 10 reps - Side Stepping with Counter Support  - 1 x daily - 7 x weekly - 4 sets - 10  reps  05/17/23 - Standing Shoulder Row with Anchored Resistance  - 1 x daily - 7 x weekly - 2 sets - 10 reps - Shoulder extension with resistance - Neutral  - 1 x daily - 7 x weekly - 2 sets - 10 reps - Standing Anti-Rotation Press with Anchored Resistance  - 1 x daily - 7 x weekly - 2 sets - 10 reps  06/04/23 - Step Up  - 1  x daily - 7 x weekly - 3 sets - 15 reps - Lateral Step Up  - 1 x daily - 7 x weekly - 3 sets - 15 reps - Sit to Stand with Arms Crossed  - 1 x daily - 7 x weekly - 3 sets - 10 reps  ASSESSMENT:  CLINICAL IMPRESSION: Patient has met 2/2 short term goals and 2/5 long term goals with ability to complete HEP and improvement in symptoms, strength, ROM, activity tolerance, gait, balance, and functional mobility. Remaining goals not met due to continued deficits in gait, balance, strength, activity tolerance and functional mobility. Patient has made good progress toward remaining goals. Extending POC 2x/week for 8 weeks to continue to work on symptoms, strength, balance, gait, and functional mobility. Patient will continue to benefit from skilled physical therapy in order to improve function and reduce impairment.    OBJECTIVE IMPAIRMENTS: Abnormal gait, decreased activity tolerance, decreased balance, decreased endurance, decreased mobility, difficulty walking, decreased ROM, decreased strength, increased muscle spasms, impaired flexibility, improper body mechanics, postural dysfunction, and pain.   ACTIVITY LIMITATIONS: carrying, lifting, bending, standing, squatting, transfers, hygiene/grooming, locomotion level, and caring for others  PARTICIPATION LIMITATIONS: meal prep, cleaning, laundry, shopping, community activity, and yard work  PERSONAL FACTORS: Fitness, Time since onset of injury/illness/exacerbation, and 3+ comorbidities: HTN, HLD, DM, hx back pain  are also affecting patient's functional outcome.   REHAB POTENTIAL: Good  CLINICAL DECISION MAKING:  Stable/uncomplicated  EVALUATION COMPLEXITY: Low   GOALS: Goals reviewed with patient? Yes  SHORT TERM GOALS: Target date: 05/28/2023    Patient will be independent with HEP in order to improve functional outcomes. Baseline:  Goal status: MET (9/30)  2.  Patient will report at least 25% improvement in symptoms for improved quality of life. Baseline:  Goal status: MET    LONG TERM GOALS: Target date: 06/25/2023    Patient will report at least 75% improvement in symptoms for improved quality of life. Baseline:  Goal status: IN PROGRESS  2.  Patient will improve FOTO score by at least 9 points in order to indicate improved tolerance to activity. Baseline: 53% function 06/18/23: 53% function Goal status: INITIAL  3.  Patient will demonstrate at least 25% improvement in lumbar ROM in all restricted planes for improved ability to move trunk while completing chores. Baseline:  Goal status: MET  4.  Patient will report improved confidence with gait, balance, and strength in roder to return to general exercise routine. Baseline:  Goal status: INITIAL  5.  Patient will demonstrate 5lb increase in muscle testing in all tested musculature as evidence of improved strength to assist with stair ambulation and gait.   Baseline: see above Goal status: MET     PLAN:  PT FREQUENCY: 2x/week  PT DURATION: 8 weeks  PLANNED INTERVENTIONS: Therapeutic exercises, Therapeutic activity, Neuromuscular re-education, Balance training, Gait training, Patient/Family education, Joint manipulation, Joint mobilization, Stair training, Orthotic/Fit training, DME instructions, Aquatic Therapy, Dry Needling, Electrical stimulation, Spinal manipulation, Spinal mobilization, Cryotherapy, Moist heat, Compression bandaging, scar mobilization, Splintting, Taping, Traction, Ultrasound, Ionotophoresis 4mg /ml Dexamethasone, and Manual therapy  PLAN FOR NEXT SESSION: test balance, hip strength, functional  strength, lumbar mobility, postural and core strength   Wyman Songster, PT 06/18/2023, 2:34 PM   Referring diagnosis? M54.42 (ICD-10-CM) - Left-sided low back pain with left-sided sciatica, unspecified chronicity Treatment diagnosis? (if different than referring diagnosis) M54.59 What was this (referring dx) caused by? []  Surgery [x]  Fall [x]  Ongoing issue []  Arthritis []  Other: ____________  Laterality: []  Rt []  Lt [x]  Both  Check all possible CPT codes:  *CHOOSE 10 OR LESS*    []  97110 (Therapeutic Exercise)  []  92507 (SLP Treatment)  []  13086 (Neuro Re-ed)   []  92526 (Swallowing Treatment)   a [id number] (Gait Training)   []  [id number] (Cognitive Training, 1st 15 minutes) []  97140 (Manual Therapy)   []  97130 (Cognitive Training, each add'l 15 minutes)  []  97164 (Re-evaluation)                              []  Other, List CPT Code ____________  []  97530 (Therapeutic Activities)     []  97535 (Self Care)   [x]  All codes above (97110 - 97535)  []  97012 (Mechanical Traction)  []  97014 (E-stim Unattended)  []  97032 (E-stim manual)  []  97033 (Ionto)  []  97035 (Ultrasound) []  96295 (Physical Performance Training) []  [id number] (Aquatic Therapy) []  97016 (Vasopneumatic Device) []  [id number] (Paraffin) []  97034 (Contrast Bath) []  97597 (Wound Care 1st 20 sq cm) []  97598 (Wound Care each add'l 20 sq cm) []  97760 (Orthotic Fabrication, Fitting, Training Initial) []  [id number] (Prosthetic Management and Training Initial) []  [id number] (Orthotic or Prosthetic Training/ Modification Subsequent)

## 2023-06-19 ENCOUNTER — Encounter (HOSPITAL_BASED_OUTPATIENT_CLINIC_OR_DEPARTMENT_OTHER): Payer: Self-pay | Admitting: Physical Therapy

## 2023-07-16 ENCOUNTER — Encounter (HOSPITAL_BASED_OUTPATIENT_CLINIC_OR_DEPARTMENT_OTHER): Payer: Medicare PPO

## 2023-07-17 ENCOUNTER — Encounter (HOSPITAL_BASED_OUTPATIENT_CLINIC_OR_DEPARTMENT_OTHER): Payer: Medicare PPO

## 2023-07-19 ENCOUNTER — Ambulatory Visit (HOSPITAL_BASED_OUTPATIENT_CLINIC_OR_DEPARTMENT_OTHER): Payer: Medicare PPO | Admitting: Physical Therapy

## 2023-07-21 ENCOUNTER — Ambulatory Visit (HOSPITAL_BASED_OUTPATIENT_CLINIC_OR_DEPARTMENT_OTHER): Payer: Medicare PPO

## 2023-07-23 ENCOUNTER — Ambulatory Visit (HOSPITAL_BASED_OUTPATIENT_CLINIC_OR_DEPARTMENT_OTHER): Payer: Medicare PPO | Attending: Family Medicine | Admitting: Physical Therapy

## 2023-07-23 ENCOUNTER — Encounter: Payer: Self-pay | Admitting: Family Medicine

## 2023-07-29 ENCOUNTER — Ambulatory Visit (HOSPITAL_BASED_OUTPATIENT_CLINIC_OR_DEPARTMENT_OTHER): Payer: Medicare PPO | Attending: Family Medicine | Admitting: Physical Therapy

## 2023-07-29 ENCOUNTER — Encounter (HOSPITAL_BASED_OUTPATIENT_CLINIC_OR_DEPARTMENT_OTHER): Payer: Self-pay | Admitting: Physical Therapy

## 2023-07-29 DIAGNOSIS — M6281 Muscle weakness (generalized): Secondary | ICD-10-CM | POA: Diagnosis not present

## 2023-07-29 DIAGNOSIS — R2689 Other abnormalities of gait and mobility: Secondary | ICD-10-CM | POA: Insufficient documentation

## 2023-07-29 DIAGNOSIS — M5459 Other low back pain: Secondary | ICD-10-CM | POA: Insufficient documentation

## 2023-07-29 DIAGNOSIS — R29898 Other symptoms and signs involving the musculoskeletal system: Secondary | ICD-10-CM | POA: Diagnosis not present

## 2023-07-29 NOTE — Therapy (Deleted)
OUTPATIENT PHYSICAL THERAPY TREATMENT   Patient Name: Joanne Mendoza MRN: [id number] DOB:1946/05/04, 77 y.o., female Today's Date: 07/29/2023  Progress Note   Reporting Period 04/30/23 to 06/18/23   See note below for Objective Data and Assessment of Progress/Goals   END OF SESSION:        Past Medical History:  Diagnosis Date   Hyperlipidemia    Hypertension    Past Surgical History:  Procedure Laterality Date   broken arm     CESAREAN SECTION     x 2    Patient Active Problem List   Diagnosis Date Noted   Hyperlipidemia associated with type 2 diabetes mellitus (HCC) 10/02/2018   White coat hypertension 09/07/2013   Severe obesity (BMI >= 40) (HCC) 05/21/2013   Type 2 diabetes mellitus with hyperglycemia (HCC) 05/20/2013   Essential hypertension 12/12/2008   NONSPECIFIC ABN FINDING RAD & OTH EXAM GU ORGAN 12/12/2008    PCP: Kristian Covey, MD  REFERRING PROVIDER: Kristian Covey, MD  REFERRING DIAG: [phone] (ICD-10-CM) - Left-sided low back pain with left-sided sciatica, unspecified chronicity  Rationale for Evaluation and Treatment: Rehabilitation  THERAPY DIAG:  No diagnosis found.  ONSET DATE: chronic  SUBJECTIVE:                                                                                                                                                                                           SUBJECTIVE STATEMENT: Pt states she feels her hip is improving with less pain, and improved strength and balance.   EVAL: Patient states used to be more mobile but has decreased over last few years. Had a fall earlier this year when [place]. Some swelling and numbness in R knee but has been getting better. Some left leg issues as well. Patient states some back aches and pains. Sciatic symptoms L hip/low back to L knee. Is afraid of falling so she limits activity which makes it worse, caught up in cycle.   PERTINENT HISTORY:   HTN, HLD, DM  PAIN:  Are you having pain? No  PRECAUTIONS: None  WEIGHT BEARING RESTRICTIONS: No  FALLS:  Has patient fallen in last 6 months? Yes. Number of falls 1   PLOF: Independent  PATIENT GOALS: get more active again and feel better    OBJECTIVE: (objective measures from initial evaluation unless otherwise dated)  PATIENT SURVEYS:  FOTO 53% function 06/18/23: 53% function  SCREENING FOR RED FLAGS: Bowel or bladder incontinence: No Spinal tumors: No Cauda equina syndrome: No Compression fracture: No Abdominal aneurysm: No  COGNITION: Overall cognitive status: Within functional limits for tasks  assessed     SENSATION: WFL   POSTURE: rounded shoulders, forward head, decreased lumbar lordosis, anterior pelvic tilt, and flexed trunk   PALPATION: Mild ttp lumbar paraspinals, L glutes  LUMBAR ROM:   AROM eval 06/18/23  Flexion 0% limited 0% limited  Extension 75% limited 50% limited  Right lateral flexion 25% limited 0% limited  Left lateral flexion 25% limited 0% limited  Right rotation    Left rotation     (Blank rows = not tested) * = pain/symptoms  LOWER EXTREMITY ROM:     Active  Right eval Left eval  Hip flexion    Hip extension    Hip abduction    Hip adduction    Hip internal rotation    Hip external rotation    Knee flexion    Knee extension    Ankle dorsiflexion    Ankle plantarflexion    Ankle inversion    Ankle eversion     (Blank rows = not tested) * = pain/symptoms  LOWER EXTREMITY MMT:    MMT Right eval Left eval Right 05/14/23 Left  05/14/23 Right 10/16 Left 06/18/23  Hip flexion 40.5 39.1   55.4 54.1  Hip extension   4/5 4/5 4/5 4/5  Hip abduction 43.0 41.4 4-/5 4/5 72.9/ 4-/5 60.5/ 4-/5  Hip adduction        Hip internal rotation        Hip external rotation        Knee flexion        Knee extension 73.3 66.9   82 68.3  Ankle dorsiflexion        Ankle plantarflexion        Ankle inversion        Ankle  eversion         (Blank rows = not tested) * = pain/symptoms   FUNCTIONAL TESTS:  5 times sit to stand: 9.53 seconds without UE support, mild unsteadiness no loss of balance  GAIT: Distance walked: 80 feet Assistive device utilized: None Level of assistance: Complete Independence Comments: forward trunk, decreased foot clearance, bilateral glute weakness  TODAY'S TREATMENT:                                                                                                                              DATE:  07/29/2023     06/18/23 Reassessment    06/16/23 Manual HSS LTR 5" x5ea Bridge with clam RTB 2 x 10 Hip hikes 6" step 2x10 ea  Standing hip abduction/ext RTB at ankles 2 x 10 bilateral SLS without hip drop 2x10sc ea (fingertip support)  06/11/23 Manual HSS and education on how to perform at home Prone hip extension 2 x 10 bilateral  Bridge with clam RTB 2 x 10 Hip hikes 6" step 2x10 ea  Resisted retro gait 10# 1 x 10    06/09/23  LTR 5" x10ea Manual HSS Manual ER hip stretch L STM to L  gluteal mm Instruction in Bridge 2 x 10 3sec hold Sidelying hip abduction 3x10 bil Gait in hall- x60ft x2 Modified Thomas stretch 3x30sec ea  HEP update   06/04/23 Manual ER hip stretch bil Manual hip flexor stretch in sidelying Step up 6 inch 2 x 15 bilateral Lateral step up 6 inch 2 x 15 bilateral Standing hip abduction RTB at knees 2 x 10 bilateral Hip hikes 6" step 2x10 ea   06/02/23 DKTC without ball 30sec x3 LTR 5" x10ea Manual HSS Manual ER hip stretch bil Bridge 2 x 10 3sec hold Sidelying hip abduction 2x10 bil Gait in hall- x81ft x2 Modified Thomas stretch 2x20sec ea Standing hip flexor stretch at railing 20sec x2ea  Hip hikes 6" step 2x10 ea HEP update  05/26/23 DKTC without ball 30sec x3 LTR 5" x10ea Manual HSS Manual ER hip stretch bil Manual hip flexor stretch in sidelying Bridge 2 x 10 3sec hold Sidelying hip abduction 2x10 R LE, sidelying  clams 2x10 L LE Gait in hall- x40ft Resisted side stepping at rail RTB at ankles x2 laps Step up 6 inch 2 x 10 bilateral Lateral step up 6 inch 2 x 10 bilateral  PATIENT EDUCATION:  Education details: Patient educated on exam findings, POC, scope of PT, HEP. 05/14/23: HEP Person educated: Patient Education method: Programmer, multimedia, Demonstration, and Handouts Education comprehension: verbalized understanding, returned demonstration, verbal cues required, and tactile cues required  HOME EXERCISE PROGRAM: Access Code: [email or web address] URL: [email or web address]  Date: 04/30/2023 - Standing Hip Abduction with Counter Support  - 2 x daily - 7 x weekly - 2 sets - 10 reps - Standing Hip Extension with Counter Support  - 2 x daily - 7 x weekly - 2 sets - 10 reps  05/14/23 - Supine Bridge  - 1 x daily - 7 x weekly - 2 sets - 10 reps - Supine Lower Trunk Rotation  - 1 x daily - 7 x weekly - 10 reps - 5 second hold - Abdominal Bracing  - 1 x daily - 7 x weekly - 1 sets - 10 reps - 5 second hold - Supine March  - 1 x daily - 7 x weekly - 2 sets - 10 reps - Step Up  - 1 x daily - 7 x weekly - 2 sets - 10 reps - Side Stepping with Counter Support  - 1 x daily - 7 x weekly - 4 sets - 10 reps  05/17/23 - Standing Shoulder Row with Anchored Resistance  - 1 x daily - 7 x weekly - 2 sets - 10 reps - Shoulder extension with resistance - Neutral  - 1 x daily - 7 x weekly - 2 sets - 10 reps - Standing Anti-Rotation Press with Anchored Resistance  - 1 x daily - 7 x weekly - 2 sets - 10 reps  06/04/23 - Step Up  - 1 x daily - 7 x weekly - 3 sets - 15 reps - Lateral Step Up  - 1 x daily - 7 x weekly - 3 sets - 15 reps - Sit to Stand with Arms Crossed  - 1 x daily - 7 x weekly - 3 sets - 10 reps  ASSESSMENT:  CLINICAL IMPRESSION: Patient has met 2/2 short term goals and 2/5 long term goals with ability to complete HEP and improvement in symptoms, strength, ROM, activity tolerance, gait, balance, and  functional mobility. Remaining goals not met due to continued deficits in gait, balance, strength, activity tolerance and functional  mobility. Patient has made good progress toward remaining goals. Extending POC 2x/week for 8 weeks to continue to work on symptoms, strength, balance, gait, and functional mobility. Patient will continue to benefit from skilled physical therapy in order to improve function and reduce impairment.    OBJECTIVE IMPAIRMENTS: Abnormal gait, decreased activity tolerance, decreased balance, decreased endurance, decreased mobility, difficulty walking, decreased ROM, decreased strength, increased muscle spasms, impaired flexibility, improper body mechanics, postural dysfunction, and pain.   ACTIVITY LIMITATIONS: carrying, lifting, bending, standing, squatting, transfers, hygiene/grooming, locomotion level, and caring for others  PARTICIPATION LIMITATIONS: meal prep, cleaning, laundry, shopping, community activity, and yard work  PERSONAL FACTORS: Fitness, Time since onset of injury/illness/exacerbation, and 3+ comorbidities: HTN, HLD, DM, hx back pain  are also affecting patient's functional outcome.   REHAB POTENTIAL: Good  CLINICAL DECISION MAKING: Stable/uncomplicated  EVALUATION COMPLEXITY: Low   GOALS: Goals reviewed with patient? Yes  SHORT TERM GOALS: Target date: 05/28/2023    Patient will be independent with HEP in order to improve functional outcomes. Baseline:  Goal status: MET (9/30)  2.  Patient will report at least 25% improvement in symptoms for improved quality of life. Baseline:  Goal status: MET    LONG TERM GOALS: Target date: 06/25/2023    Patient will report at least 75% improvement in symptoms for improved quality of life. Baseline:  Goal status: IN PROGRESS  2.  Patient will improve FOTO score by at least 9 points in order to indicate improved tolerance to activity. Baseline: 53% function 06/18/23: 53% function Goal status:  INITIAL  3.  Patient will demonstrate at least 25% improvement in lumbar ROM in all restricted planes for improved ability to move trunk while completing chores. Baseline:  Goal status: MET  4.  Patient will report improved confidence with gait, balance, and strength in roder to return to general exercise routine. Baseline:  Goal status: INITIAL  5.  Patient will demonstrate 5lb increase in muscle testing in all tested musculature as evidence of improved strength to assist with stair ambulation and gait.   Baseline: see above Goal status: MET     PLAN:  PT FREQUENCY: 2x/week  PT DURATION: 8 weeks  PLANNED INTERVENTIONS: Therapeutic exercises, Therapeutic activity, Neuromuscular re-education, Balance training, Gait training, Patient/Family education, Joint manipulation, Joint mobilization, Stair training, Orthotic/Fit training, DME instructions, Aquatic Therapy, Dry Needling, Electrical stimulation, Spinal manipulation, Spinal mobilization, Cryotherapy, Moist heat, Compression bandaging, scar mobilization, Splintting, Taping, Traction, Ultrasound, Ionotophoresis 4mg /ml Dexamethasone, and Manual therapy  PLAN FOR NEXT SESSION: test balance, hip strength, functional strength, lumbar mobility, postural and core strength   Dessie Coma, PT 07/29/2023, 1:48 PM   Referring diagnosis? M54.42 (ICD-10-CM) - Left-sided low back pain with left-sided sciatica, unspecified chronicity Treatment diagnosis? (if different than referring diagnosis) M54.59 What was this (referring dx) caused by? []  Surgery [x]  Fall [x]  Ongoing issue []  Arthritis []  Other: ____________  Laterality: []  Rt []  Lt [x]  Both  Check all possible CPT codes:  *CHOOSE 10 OR LESS*    []  97110 (Therapeutic Exercise)  []  92507 (SLP Treatment)  []  97112 (Neuro Re-ed)   []  92526 (Swallowing Treatment)   a 16109 (Gait Training)   []  [id number] (Cognitive Training, 1st 15 minutes) []  97140 (Manual Therapy)   []  97130  (Cognitive Training, each add'l 15 minutes)  []  97164 (Re-evaluation)                              []   Other, List CPT Code ____________  []  16109 (Therapeutic Activities)     []  97535 (Self Care)   [x]  All codes above (97110 - 97535)  []  97012 (Mechanical Traction)  []  97014 (E-stim Unattended)  []  97032 (E-stim manual)  []  97033 (Ionto)  []  97035 (Ultrasound) []  97750 (Physical Performance Training) []  [id number] (Aquatic Therapy) []  97016 (Vasopneumatic Device) []  [id number] (Paraffin) []  97034 (Contrast Bath) []  97597 (Wound Care 1st 20 sq cm) []  97598 (Wound Care each add'l 20 sq cm) []  97760 (Orthotic Fabrication, Fitting, Training Initial) []  [id number] (Prosthetic Management and Training Initial) []  [id number] (Orthotic or Prosthetic Training/ Modification Subsequent)

## 2023-07-29 NOTE — Therapy (Signed)
 OUTPATIENT PHYSICAL THERAPY TREATMENT   Patient Name: Joanne Mendoza MRN: [id number] DOB:11-07-1945, 77 y.o., female Today's Date: 07/29/2023    END OF SESSION:  PT End of Session - 07/29/23 1437     Visit Number 11    Number of Visits 32    Date for PT Re-Evaluation 08/13/23    Authorization Type [employer]    Authorization - Number of Visits 16    Progress Note Due on Visit 20    PT Start Time 1348    PT Stop Time 1430    PT Time Calculation (min) 42 min    Activity Tolerance Patient tolerated treatment well;No increased pain    Behavior During Therapy WFL for tasks assessed/performed                  Past Medical History:  Diagnosis Date   Hyperlipidemia    Hypertension    Past Surgical History:  Procedure Laterality Date   broken arm     CESAREAN SECTION     x 2    Patient Active Problem List   Diagnosis Date Noted   Hyperlipidemia associated with type 2 diabetes mellitus (HCC) 10/02/2018   White coat hypertension 09/07/2013   Severe obesity (BMI >= 40) (HCC) 05/21/2013   Type 2 diabetes mellitus with hyperglycemia (HCC) 05/20/2013   Essential hypertension 12/12/2008   NONSPECIFIC ABN FINDING RAD & OTH EXAM GU ORGAN 12/12/2008    PCP: Kristian Covey, MD  REFERRING PROVIDER: Kristian Covey, MD  REFERRING DIAG: [phone] (ICD-10-CM) - Left-sided low back pain with left-sided sciatica, unspecified chronicity  Rationale for Evaluation and Treatment: Rehabilitation  THERAPY DIAG:  Other low back pain  Other abnormalities of gait and mobility  Other symptoms and signs involving the musculoskeletal system  Muscle weakness (generalized)  ONSET DATE: chronic  SUBJECTIVE:                                                                                                                                                                                           SUBJECTIVE STATEMENT: Pt states she feels her hip is improving as she is having  less pain and improved strength & balance.   EVAL: Patient states used to be more mobile but has decreased over last few years. Had a fall earlier this year when walking [place]. Some swelling and numbness in R knee but has been getting better. Some left leg issues as well. Patient states some back aches and pains. Sciatic symptoms L hip/low back to L knee. Is afraid of falling so she limits activity which makes it worse,  caught up in cycle.   PERTINENT HISTORY:  HTN, HLD, DM  PAIN:  Are you having pain? No  PRECAUTIONS: None  WEIGHT BEARING RESTRICTIONS: No  FALLS:  Has patient fallen in last 6 months? Yes. Number of falls 1   PLOF: Independent  PATIENT GOALS: get more active again and feel better    OBJECTIVE: (objective measures from initial evaluation unless otherwise dated)  PATIENT SURVEYS:  FOTO 53% function 06/18/23: 53% function  SCREENING FOR RED FLAGS: Bowel or bladder incontinence: No Spinal tumors: No Cauda equina syndrome: No Compression fracture: No Abdominal aneurysm: No  COGNITION: Overall cognitive status: Within functional limits for tasks assessed     SENSATION: WFL   POSTURE: rounded shoulders, forward head, decreased lumbar lordosis, anterior pelvic tilt, and flexed trunk   PALPATION: Mild ttp lumbar paraspinals, L glutes  LUMBAR ROM:   AROM eval 06/18/23  Flexion 0% limited 0% limited  Extension 75% limited 50% limited  Right lateral flexion 25% limited 0% limited  Left lateral flexion 25% limited 0% limited  Right rotation    Left rotation     (Blank rows = not tested) * = pain/symptoms  LOWER EXTREMITY ROM:     Active  Right eval Left eval  Hip flexion    Hip extension    Hip abduction    Hip adduction    Hip internal rotation    Hip external rotation    Knee flexion    Knee extension    Ankle dorsiflexion    Ankle plantarflexion    Ankle inversion    Ankle eversion     (Blank rows = not tested) *  = pain/symptoms  LOWER EXTREMITY MMT:    MMT Right eval Left eval Right 05/14/23 Left  05/14/23 Right 10/16 Left 06/18/23  Hip flexion 40.5 39.1   55.4 54.1  Hip extension   4/5 4/5 4/5 4/5  Hip abduction 43.0 41.4 4-/5 4/5 72.9/ 4-/5 60.5/ 4-/5  Hip adduction        Hip internal rotation        Hip external rotation        Knee flexion        Knee extension 73.3 66.9   82 68.3  Ankle dorsiflexion        Ankle plantarflexion        Ankle inversion        Ankle eversion         (Blank rows = not tested) * = pain/symptoms   FUNCTIONAL TESTS:  5 times sit to stand: 9.53 seconds without UE support, mild unsteadiness no loss of balance  GAIT: Distance walked: 80 feet Assistive device utilized: None Level of assistance: Complete Independence Comments: forward trunk, decreased foot clearance, bilateral glute weakness  TODAY'S TREATMENT:  DATE:  07/29/2023 LTR 5" x5ea Bridge with clam RTB 2 x 10 Hip hikes 6" step 2x10 ea  Standing hip abduction/ext with 3# ankle weight at ankles 2 x 1s bilateral SLS without hip drop 2x20sc ea (fingertip support) Progressed with time)  -Sit to stands from mat table at knee height x12  -Squats at mat table 2x12  06/18/23 Reassessment    06/16/23 Manual HSS LTR 5" x5ea Bridge with clam RTB 2 x 10 Hip hikes 6" step 2x10 ea  Standing hip abduction/ext RTB at ankles 2 x 10 bilateral SLS without hip drop 2x10sc ea (fingertip support)  06/11/23 Manual HSS and education on how to perform at home Prone hip extension 2 x 10 bilateral  Bridge with clam RTB 2 x 10 Hip hikes 6" step 2x10 ea  Resisted retro gait 10# 1 x 10    06/09/23  LTR 5" x10ea Manual HSS Manual ER hip stretch L STM to L gluteal mm Instruction in Bridge 2 x 10 3sec hold Sidelying hip abduction 3x10 bil Gait in hall- x44ft x2 Modified  Thomas stretch 3x30sec ea  HEP update   06/04/23 Manual ER hip stretch bil Manual hip flexor stretch in sidelying Step up 6 inch 2 x 15 bilateral Lateral step up 6 inch 2 x 15 bilateral Standing hip abduction RTB at knees 2 x 10 bilateral Hip hikes 6" step 2x10 ea   06/02/23 DKTC without ball 30sec x3 LTR 5" x10ea Manual HSS Manual ER hip stretch bil Bridge 2 x 10 3sec hold Sidelying hip abduction 2x10 bil Gait in hall- x37ft x2 Modified Thomas stretch 2x20sec ea Standing hip flexor stretch at railing 20sec x2ea  Hip hikes 6" step 2x10 ea HEP update  05/26/23 DKTC without ball 30sec x3 LTR 5" x10ea Manual HSS Manual ER hip stretch bil Manual hip flexor stretch in sidelying Bridge 2 x 10 3sec hold Sidelying hip abduction 2x10 R LE, sidelying clams 2x10 L LE Gait in hall- x69ft Resisted side stepping at rail RTB at ankles x2 laps Step up 6 inch 2 x 10 bilateral Lateral step up 6 inch 2 x 10 bilateral  PATIENT EDUCATION:  Education details: Patient educated on exam findings, POC, scope of PT, HEP. 05/14/23: HEP Person educated: Patient Education method: Programmer, multimedia, Demonstration, and Handouts Education comprehension: verbalized understanding, returned demonstration, verbal cues required, and tactile cues required  HOME EXERCISE PROGRAM: Access Code: [email or web address] URL: [email or web address]  Date: 04/30/2023 - Standing Hip Abduction with Counter Support  - 2 x daily - 7 x weekly - 2 sets - 10 reps - Standing Hip Extension with Counter Support  - 2 x daily - 7 x weekly - 2 sets - 10 reps  05/14/23 - Supine Bridge  - 1 x daily - 7 x weekly - 2 sets - 10 reps - Supine Lower Trunk Rotation  - 1 x daily - 7 x weekly - 10 reps - 5 second hold - Abdominal Bracing  - 1 x daily - 7 x weekly - 1 sets - 10 reps - 5 second hold - Supine March  - 1 x daily - 7 x weekly - 2 sets - 10 reps - Step Up  - 1 x daily - 7 x weekly - 2 sets - 10 reps - Side Stepping with  Counter Support  - 1 x daily - 7 x weekly - 4 sets - 10 reps  05/17/23 - Standing Shoulder Row with Anchored Resistance  - 1 x daily - 7  x weekly - 2 sets - 10 reps - Shoulder extension with resistance - Neutral  - 1 x daily - 7 x weekly - 2 sets - 10 reps - Standing Anti-Rotation Press with Anchored Resistance  - 1 x daily - 7 x weekly - 2 sets - 10 reps  06/04/23 - Step Up  - 1 x daily - 7 x weekly - 3 sets - 15 reps - Lateral Step Up  - 1 x daily - 7 x weekly - 3 sets - 15 reps - Sit to Stand with Arms Crossed  - 1 x daily - 7 x weekly - 3 sets - 10 reps  ASSESSMENT:  CLINICAL IMPRESSION: Session focused on ther ex and NMRE to promote balance/stability and proper activation in standing/dynamic tasks. Pt able to progress multiple interventions by increasing her time and progress from sit to stands to squats. Pt continues to have some difficulty with lateral hip drop secondary to muscular fatigue and strength during single limb support interventions. Continue to promote single and double leg LE strength and balance interventions as tolerated. Patient will continue to benefit from skilled physical therapy in order to improve function and reduce impairment.      OBJECTIVE IMPAIRMENTS: Abnormal gait, decreased activity tolerance, decreased balance, decreased endurance, decreased mobility, difficulty walking, decreased ROM, decreased strength, increased muscle spasms, impaired flexibility, improper body mechanics, postural dysfunction, and pain.   ACTIVITY LIMITATIONS: carrying, lifting, bending, standing, squatting, transfers, hygiene/grooming, locomotion level, and caring for others  PARTICIPATION LIMITATIONS: meal prep, cleaning, laundry, shopping, community activity, and yard work  PERSONAL FACTORS: Fitness, Time since onset of injury/illness/exacerbation, and 3+ comorbidities: HTN, HLD, DM, hx back pain  are also affecting patient's functional outcome.   REHAB POTENTIAL: Good  CLINICAL  DECISION MAKING: Stable/uncomplicated  EVALUATION COMPLEXITY: Low   GOALS: Goals reviewed with patient? Yes  SHORT TERM GOALS: Target date: 05/28/2023    Patient will be independent with HEP in order to improve functional outcomes. Baseline:  Goal status: MET (9/30)  2.  Patient will report at least 25% improvement in symptoms for improved quality of life. Baseline:  Goal status: MET    LONG TERM GOALS: Target date: 06/25/2023    Patient will report at least 75% improvement in symptoms for improved quality of life. Baseline:  Goal status: IN PROGRESS  2.  Patient will improve FOTO score by at least 9 points in order to indicate improved tolerance to activity. Baseline: 53% function 06/18/23: 53% function Goal status: INITIAL  3.  Patient will demonstrate at least 25% improvement in lumbar ROM in all restricted planes for improved ability to move trunk while completing chores. Baseline:  Goal status: MET  4.  Patient will report improved confidence with gait, balance, and strength in roder to return to general exercise routine. Baseline:  Goal status: INITIAL  5.  Patient will demonstrate 5lb increase in muscle testing in all tested musculature as evidence of improved strength to assist with stair ambulation and gait.   Baseline: see above Goal status: MET     PLAN:  PT FREQUENCY: 2x/week  PT DURATION: 8 weeks  PLANNED INTERVENTIONS: Therapeutic exercises, Therapeutic activity, Neuromuscular re-education, Balance training, Gait training, Patient/Family education, Joint manipulation, Joint mobilization, Stair training, Orthotic/Fit training, DME instructions, Aquatic Therapy, Dry Needling, Electrical stimulation, Spinal manipulation, Spinal mobilization, Cryotherapy, Moist heat, Compression bandaging, scar mobilization, Splintting, Taping, Traction, Ultrasound, Ionotophoresis 4mg /ml Dexamethasone, and Manual therapy  PLAN FOR NEXT SESSION: test balance, hip  strength, functional strength,  lumbar mobility, postural and core strength   [employer], Student-PT  This entire session was performed under direct supervision and direction of a licensed Estate agent . I have personally read, edited and approve of the note as written. Lorayne Bender PT DPT 07/29/2023, 4:20 PM   Referring diagnosis? M54.42 (ICD-10-CM) - Left-sided low back pain with left-sided sciatica, unspecified chronicity Treatment diagnosis? (if different than referring diagnosis) M54.59 What was this (referring dx) caused by? []  Surgery [x]  Fall [x]  Ongoing issue []  Arthritis []  Other: ____________  Laterality: []  Rt []  Lt [x]  Both  Check all possible CPT codes:  *CHOOSE 10 OR LESS*    []  97110 (Therapeutic Exercise)  []  92507 (SLP Treatment)  []  97112 (Neuro Re-ed)   []  92526 (Swallowing Treatment)   a 32440 (Gait Training)   []  [id number] (Cognitive Training, 1st 15 minutes) []  97140 (Manual Therapy)   []  97130 (Cognitive Training, each add'l 15 minutes)  []  97164 (Re-evaluation)                              []  Other, List CPT Code ____________  []  97530 (Therapeutic Activities)     []  97535 (Self Care)   [x]  All codes above (97110 - 97535)  []  97012 (Mechanical Traction)  []  97014 (E-stim Unattended)  []  97032 (E-stim manual)  []  97033 (Ionto)  []  97035 (Ultrasound) []  97750 (Physical Performance Training) []  [id number] (Aquatic Therapy) []  97016 (Vasopneumatic Device) []  [id number] (Paraffin) []  97034 (Contrast Bath) []  97597 (Wound Care 1st 20 sq cm) []  97598 (Wound Care each add'l 20 sq cm) []  10272 (Orthotic Fabrication, Fitting, Training Initial) []  [id number] (Prosthetic Management and Training Initial) []  [id number] (Orthotic or Prosthetic Training/ Modification Subsequent)

## 2023-08-04 ENCOUNTER — Ambulatory Visit (HOSPITAL_BASED_OUTPATIENT_CLINIC_OR_DEPARTMENT_OTHER): Payer: Medicare PPO | Attending: Family Medicine

## 2023-08-04 DIAGNOSIS — R2689 Other abnormalities of gait and mobility: Secondary | ICD-10-CM | POA: Diagnosis not present

## 2023-08-04 DIAGNOSIS — M6281 Muscle weakness (generalized): Secondary | ICD-10-CM | POA: Insufficient documentation

## 2023-08-04 DIAGNOSIS — R29898 Other symptoms and signs involving the musculoskeletal system: Secondary | ICD-10-CM | POA: Diagnosis not present

## 2023-08-04 DIAGNOSIS — M5459 Other low back pain: Secondary | ICD-10-CM | POA: Insufficient documentation

## 2023-08-04 NOTE — Therapy (Signed)
OUTPATIENT PHYSICAL THERAPY TREATMENT   Patient Name: Joanne Mendoza MRN: [id number] DOB:09-18-1945, 77 y.o., female Today's Date: 08/04/2023    END OF SESSION:  PT End of Session - 08/04/23 1350     Visit Number 12    Number of Visits 32    Date for PT Re-Evaluation 08/13/23    Authorization Type [employer]    Authorization - Visit Number 12    Authorization - Number of Visits 16    Progress Note Due on Visit 20    PT Start Time 1355    PT Stop Time 1440    PT Time Calculation (min) 45 min    Activity Tolerance Patient tolerated treatment well    Behavior During Therapy WFL for tasks assessed/performed                   Past Medical History:  Diagnosis Date   Hyperlipidemia    Hypertension    Past Surgical History:  Procedure Laterality Date   broken arm     CESAREAN SECTION     x 2    Patient Active Problem List   Diagnosis Date Noted   Hyperlipidemia associated with type 2 diabetes mellitus (HCC) 10/02/2018   White coat hypertension 09/07/2013   Severe obesity (BMI >= 40) (HCC) 05/21/2013   Type 2 diabetes mellitus with hyperglycemia (HCC) 05/20/2013   Essential hypertension 12/12/2008   NONSPECIFIC ABN FINDING RAD & OTH EXAM GU ORGAN 12/12/2008    PCP: Kristian Covey, MD  REFERRING PROVIDER: Kristian Covey, MD  REFERRING DIAG: [phone] (ICD-10-CM) - Left-sided low back pain with left-sided sciatica, unspecified chronicity  Rationale for Evaluation and Treatment: Rehabilitation  THERAPY DIAG:  Muscle weakness (generalized)  Other symptoms and signs involving the musculoskeletal system  Other abnormalities of gait and mobility  Other low back pain  ONSET DATE: chronic  SUBJECTIVE:                                                                                                                                                                                           SUBJECTIVE STATEMENT: Pt reports improved walking pattern.     EVAL: Patient states used to be more mobile but has decreased over last few years. Had a fall earlier this year when walking [place]. Some swelling and numbness in R knee but has been getting better. Some left leg issues as well. Patient states some back aches and pains. Sciatic symptoms L hip/low back to L knee. Is afraid of falling so she limits activity which makes it worse, caught up in cycle.  PERTINENT HISTORY:  HTN, HLD, DM  PAIN:  Are you having pain? No  PRECAUTIONS: None  WEIGHT BEARING RESTRICTIONS: No  FALLS:  Has patient fallen in last 6 months? Yes. Number of falls 1   PLOF: Independent  PATIENT GOALS: get more active again and feel better    OBJECTIVE: (objective measures from initial evaluation unless otherwise dated)  PATIENT SURVEYS:  FOTO 53% function 06/18/23: 53% function  SCREENING FOR RED FLAGS: Bowel or bladder incontinence: No Spinal tumors: No Cauda equina syndrome: No Compression fracture: No Abdominal aneurysm: No  COGNITION: Overall cognitive status: Within functional limits for tasks assessed     SENSATION: WFL   POSTURE: rounded shoulders, forward head, decreased lumbar lordosis, anterior pelvic tilt, and flexed trunk   PALPATION: Mild ttp lumbar paraspinals, L glutes  LUMBAR ROM:   AROM eval 06/18/23  Flexion 0% limited 0% limited  Extension 75% limited 50% limited  Right lateral flexion 25% limited 0% limited  Left lateral flexion 25% limited 0% limited  Right rotation    Left rotation     (Blank rows = not tested) * = pain/symptoms  LOWER EXTREMITY ROM:     Active  Right eval Left eval  Hip flexion    Hip extension    Hip abduction    Hip adduction    Hip internal rotation    Hip external rotation    Knee flexion    Knee extension    Ankle dorsiflexion    Ankle plantarflexion    Ankle inversion    Ankle eversion     (Blank rows = not tested) * = pain/symptoms  LOWER EXTREMITY MMT:     MMT Right eval Left eval Right 05/14/23 Left  05/14/23 Right 10/16 Left 06/18/23  Hip flexion 40.5 39.1   55.4 54.1  Hip extension   4/5 4/5 4/5 4/5  Hip abduction 43.0 41.4 4-/5 4/5 72.9/ 4-/5 60.5/ 4-/5  Hip adduction        Hip internal rotation        Hip external rotation        Knee flexion        Knee extension 73.3 66.9   82 68.3  Ankle dorsiflexion        Ankle plantarflexion        Ankle inversion        Ankle eversion         (Blank rows = not tested) * = pain/symptoms   FUNCTIONAL TESTS:  5 times sit to stand: 9.53 seconds without UE support, mild unsteadiness no loss of balance  GAIT: Distance walked: 80 feet Assistive device utilized: None Level of assistance: Complete Independence Comments: forward trunk, decreased foot clearance, bilateral glute weakness  TODAY'S TREATMENT:                                                                                                                              DATE:  08/04/2023  LTR 5" x10ea Bridge with clam RTB 2 x 10 Sidelying hip abduction 2x10ea Hip hikes 6" step 2x20 ea  Standing hip abduction/ext with 3# ankle weight at ankles 2 x 1s bilateral SLS without hip drop 2x20sc ea (fingertip support) Progressed with time) -Squats at mat table 2x10 elevated, 2x10 lowered -gait in hall  06/18/23 Reassessment    06/16/23 Manual HSS LTR 5" x5ea Bridge with clam RTB 2 x 10 Hip hikes 6" step 2x10 ea  Standing hip abduction/ext RTB at ankles 2 x 10 bilateral SLS without hip drop 2x10sc ea (fingertip support)  06/11/23 Manual HSS and education on how to perform at home Prone hip extension 2 x 10 bilateral  Bridge with clam RTB 2 x 10 Hip hikes 6" step 2x10 ea  Resisted retro gait 10# 1 x 10    06/09/23  LTR 5" x10ea Manual HSS Manual ER hip stretch L STM to L gluteal mm Instruction in Bridge 2 x 10 3sec hold Sidelying hip abduction 3x10 bil Gait in hall- x22ft x2 Modified Thomas stretch 3x30sec  ea  HEP update   PATIENT EDUCATION:  Education details: Patient educated on exam findings, POC, scope of PT, HEP. 05/14/23: HEP Person educated: Patient Education method: Programmer, multimedia, Demonstration, and Handouts Education comprehension: verbalized understanding, returned demonstration, verbal cues required, and tactile cues required  HOME EXERCISE PROGRAM: Access Code: [email or web address] URL: [email or web address]  Date: 04/30/2023 - Standing Hip Abduction with Counter Support  - 2 x daily - 7 x weekly - 2 sets - 10 reps - Standing Hip Extension with Counter Support  - 2 x daily - 7 x weekly - 2 sets - 10 reps  05/14/23 - Supine Bridge  - 1 x daily - 7 x weekly - 2 sets - 10 reps - Supine Lower Trunk Rotation  - 1 x daily - 7 x weekly - 10 reps - 5 second hold - Abdominal Bracing  - 1 x daily - 7 x weekly - 1 sets - 10 reps - 5 second hold - Supine March  - 1 x daily - 7 x weekly - 2 sets - 10 reps - Step Up  - 1 x daily - 7 x weekly - 2 sets - 10 reps - Side Stepping with Counter Support  - 1 x daily - 7 x weekly - 4 sets - 10 reps  05/17/23 - Standing Shoulder Row with Anchored Resistance  - 1 x daily - 7 x weekly - 2 sets - 10 reps - Shoulder extension with resistance - Neutral  - 1 x daily - 7 x weekly - 2 sets - 10 reps - Standing Anti-Rotation Press with Anchored Resistance  - 1 x daily - 7 x weekly - 2 sets - 10 reps  06/04/23 - Step Up  - 1 x daily - 7 x weekly - 3 sets - 15 reps - Lateral Step Up  - 1 x daily - 7 x weekly - 3 sets - 15 reps - Sit to Stand with Arms Crossed  - 1 x daily - 7 x weekly - 3 sets - 10 reps  ASSESSMENT:  CLINICAL IMPRESSION: Improving with gait quality, though mild reversion to swayed pattern when not ficused on correction. She denies pain with exercises. Able to complete s/l hip abd without cuing for positioning. Pt tolerates stretching program well. Will benefit from continued hip strengthening to further improve gait quality and tolerance  for functional activities.    OBJECTIVE IMPAIRMENTS: Abnormal gait, decreased activity  tolerance, decreased balance, decreased endurance, decreased mobility, difficulty walking, decreased ROM, decreased strength, increased muscle spasms, impaired flexibility, improper body mechanics, postural dysfunction, and pain.   ACTIVITY LIMITATIONS: carrying, lifting, bending, standing, squatting, transfers, hygiene/grooming, locomotion level, and caring for others  PARTICIPATION LIMITATIONS: meal prep, cleaning, laundry, shopping, community activity, and yard work  PERSONAL FACTORS: Fitness, Time since onset of injury/illness/exacerbation, and 3+ comorbidities: HTN, HLD, DM, hx back pain  are also affecting patient's functional outcome.   REHAB POTENTIAL: Good  CLINICAL DECISION MAKING: Stable/uncomplicated  EVALUATION COMPLEXITY: Low   GOALS: Goals reviewed with patient? Yes  SHORT TERM GOALS: Target date: 05/28/2023    Patient will be independent with HEP in order to improve functional outcomes. Baseline:  Goal status: MET (9/30)  2.  Patient will report at least 25% improvement in symptoms for improved quality of life. Baseline:  Goal status: MET    LONG TERM GOALS: Target date: 06/25/2023    Patient will report at least 75% improvement in symptoms for improved quality of life. Baseline:  Goal status: IN PROGRESS  2.  Patient will improve FOTO score by at least 9 points in order to indicate improved tolerance to activity. Baseline: 53% function 06/18/23: 53% function Goal status: INITIAL  3.  Patient will demonstrate at least 25% improvement in lumbar ROM in all restricted planes for improved ability to move trunk while completing chores. Baseline:  Goal status: MET  4.  Patient will report improved confidence with gait, balance, and strength in roder to return to general exercise routine. Baseline:  Goal status: INITIAL  5.  Patient will demonstrate 5lb increase in  muscle testing in all tested musculature as evidence of improved strength to assist with stair ambulation and gait.   Baseline: see above Goal status: MET     PLAN:  PT FREQUENCY: 2x/week  PT DURATION: 8 weeks  PLANNED INTERVENTIONS: Therapeutic exercises, Therapeutic activity, Neuromuscular re-education, Balance training, Gait training, Patient/Family education, Joint manipulation, Joint mobilization, Stair training, Orthotic/Fit training, DME instructions, Aquatic Therapy, Dry Needling, Electrical stimulation, Spinal manipulation, Spinal mobilization, Cryotherapy, Moist heat, Compression bandaging, scar mobilization, Splintting, Taping, Traction, Ultrasound, Ionotophoresis 4mg /ml Dexamethasone, and Manual therapy  PLAN FOR NEXT SESSION: test balance, hip strength, functional strength, lumbar mobility, postural and core strength   Riki Altes, PTA   Referring diagnosis? M54.42 (ICD-10-CM) - Left-sided low back pain with left-sided sciatica, unspecified chronicity Treatment diagnosis? (if different than referring diagnosis) M54.59 What was this (referring dx) caused by? []  Surgery [x]  Fall [x]  Ongoing issue []  Arthritis []  Other: ____________  Laterality: []  Rt []  Lt [x]  Both  Check all possible CPT codes:  *CHOOSE 10 OR LESS*    []  97110 (Therapeutic Exercise)  []  92507 (SLP Treatment)  []  16109 (Neuro Re-ed)   []  92526 (Swallowing Treatment)   a 60454 (Gait Training)   []  [id number] (Cognitive Training, 1st 15 minutes) []  97140 (Manual Therapy)   []  97130 (Cognitive Training, each add'l 15 minutes)  []  97164 (Re-evaluation)                              []  Other, List CPT Code ____________  []  09811 (Therapeutic Activities)     []  97535 (Self Care)   [x]  All codes above (97110 - 97535)  []  97012 (Mechanical Traction)  []  97014 (E-stim Unattended)  []  97032 (E-stim manual)  []  97033 (Ionto)  []  97035 (Ultrasound) []  97750 (Physical Performance Training) []  [id number]  (  Aquatic Therapy) []  97016 (Vasopneumatic Device) []  [id number] (Paraffin) []  97034 (Contrast Bath) []  [phone] (Wound Care 1st 20 sq cm) []  97598 (Wound Care each add'l 20 sq cm) []  60454 (Orthotic Fabrication, Fitting, Training Initial) []  [id number] (Prosthetic Management and Training Initial) []  [id number] (Orthotic or Prosthetic Training/ Modification Subsequent)

## 2023-08-06 ENCOUNTER — Ambulatory Visit (HOSPITAL_BASED_OUTPATIENT_CLINIC_OR_DEPARTMENT_OTHER): Payer: Medicare PPO | Admitting: Physical Therapy

## 2023-08-11 ENCOUNTER — Ambulatory Visit (HOSPITAL_BASED_OUTPATIENT_CLINIC_OR_DEPARTMENT_OTHER): Payer: Medicare PPO | Admitting: Physical Therapy

## 2023-08-11 DIAGNOSIS — M6281 Muscle weakness (generalized): Secondary | ICD-10-CM | POA: Diagnosis not present

## 2023-08-11 DIAGNOSIS — R29898 Other symptoms and signs involving the musculoskeletal system: Secondary | ICD-10-CM

## 2023-08-11 DIAGNOSIS — M5459 Other low back pain: Secondary | ICD-10-CM | POA: Diagnosis not present

## 2023-08-11 DIAGNOSIS — R2689 Other abnormalities of gait and mobility: Secondary | ICD-10-CM | POA: Diagnosis not present

## 2023-08-11 NOTE — Therapy (Signed)
OUTPATIENT PHYSICAL THERAPY TREATMENT/progress note    Patient Name: Joanne Mendoza MRN: [id number] DOB:1945/10/23, 77 y.o., female Today's Date: 08/12/2023    END OF SESSION:  PT End of Session - 08/11/23 1344     Visit Number 13    Number of Visits 32    Date for PT Re-Evaluation 10/06/23    Authorization Type [employer]    PT Start Time 1345   Patient was 30 min late   PT Stop Time 1410    PT Time Calculation (min) 25 min    Activity Tolerance Patient tolerated treatment well    Behavior During Therapy [facility] for tasks assessed/performed             Progress Note Reporting Period 06/28/2023 to 08/11/2023  See note below for Objective Data and Assessment of Progress/Goals.          Past Medical History:  Diagnosis Date   Hyperlipidemia    Hypertension    Past Surgical History:  Procedure Laterality Date   broken arm     CESAREAN SECTION     x 2    Patient Active Problem List   Diagnosis Date Noted   Hyperlipidemia associated with type 2 diabetes mellitus (HCC) 10/02/2018   White coat hypertension 09/07/2013   Severe obesity (BMI >= 40) (HCC) 05/21/2013   Type 2 diabetes mellitus with hyperglycemia (HCC) 05/20/2013   Essential hypertension 12/12/2008   NONSPECIFIC ABN FINDING RAD & OTH EXAM GU ORGAN 12/12/2008    PCP: Kristian Covey, MD  REFERRING PROVIDER: Kristian Covey, MD  REFERRING DIAG: [phone] (ICD-10-CM) - Left-sided low back pain with left-sided sciatica, unspecified chronicity  Rationale for Evaluation and Treatment: Rehabilitation  THERAPY DIAG:  Muscle weakness (generalized)  Other symptoms and signs involving the musculoskeletal system  Other abnormalities of gait and mobility  Other low back pain  ONSET DATE: chronic  SUBJECTIVE:                                                                                                                                                                                            SUBJECTIVE STATEMENT: Pt reports improved walking pattern. She feels like overall her balance is better. She has had left hip pain.    EVAL: Patient states used to be more mobile but has decreased over last few years. Had a fall earlier this year when walking [place]. Some swelling and numbness in R knee but has been getting better. Some left leg issues as well. Patient states some back aches and pains. Sciatic symptoms L hip/low back to L knee. Is  afraid of falling so she limits activity which makes it worse, caught up in cycle.   PERTINENT HISTORY:  HTN, HLD, DM  PAIN:  Are you having pain? No  PRECAUTIONS: None  WEIGHT BEARING RESTRICTIONS: No  FALLS:  Has patient fallen in last 6 months? Yes. Number of falls 1   PLOF: Independent  PATIENT GOALS: get more active again and feel better    OBJECTIVE: (objective measures from initial evaluation unless otherwise dated)  PATIENT SURVEYS:  FOTO 53% function 06/18/23: 53% function  SCREENING FOR RED FLAGS: Bowel or bladder incontinence: No Spinal tumors: No Cauda equina syndrome: No Compression fracture: No Abdominal aneurysm: No  COGNITION: Overall cognitive status: Within functional limits for tasks assessed     SENSATION: WFL   POSTURE: rounded shoulders, forward head, decreased lumbar lordosis, anterior pelvic tilt, and flexed trunk   PALPATION: Mild ttp lumbar paraspinals, L glutes  LUMBAR ROM:   AROM eval 12/9   Flexion 0% limited 0% limited   Extension 75% limited 50% limited 0% No pain   Right lateral flexion 25% limited 0% limited   Left lateral flexion 25% limited 0% limited   Right rotation     Left rotation      (Blank rows = not tested) * = pain/symptoms  LOWER EXTREMITY ROM:     Passive  Right eval Left eval  Hip flexion    Hip extension    Hip abduction    Hip adduction    Hip internal rotation    Hip external rotation    Knee flexion    Knee extension    Ankle  dorsiflexion    Ankle plantarflexion    Ankle inversion    Ankle eversion     (Blank rows = not tested) * = pain/symptoms  LOWER EXTREMITY MMT:    MMT Right eval Left eval Right 05/14/23 Left  05/14/23 Right 10/16 Left 06/18/23 Right 12/09 Left 12/09  Hip flexion 40.5 39.1   55.4 54.1 39.9 37.1  Hip extension   4/5 4/5 4/5 4/5    Hip abduction 43.0 41.4 4-/5 4/5 72.9/ 4-/5 60.5/ 4-/5 43.7 36.5  Hip adduction          Hip internal rotation          Hip external rotation          Knee flexion          Knee extension 73.3 66.9   82 68.3 26.7 43.3  Ankle dorsiflexion          Ankle plantarflexion          Ankle inversion          Ankle eversion           (Blank rows = not tested) * = pain/symptoms   FUNCTIONAL TESTS:  5 times sit to stand: 9.53 seconds without UE support, mild unsteadiness no loss of balance   12/9 9 seconds      GAIT: Distance walked: 80 feet Assistive device utilized: None Level of assistance: Complete Independence Comments: forward trunk, decreased foot clearance, bilateral glute weakness  TODAY'S TREATMENT:  DATE:  12/9 Testing for re eval    Last visit: LTR 5" x10ea Bridge with clam RTB 2 x 10 Sidelying hip abduction 2x10ea Hip hikes 6" step 2x20 ea  Standing hip abduction/ext with 3# ankle weight at ankles 2 x 1s bilateral SLS without hip drop 2x20sc ea (fingertip support) Progressed with time) -Squats at mat table 2x10 elevated, 2x10 lowered -gait in hall  06/18/23 Reassessment    06/16/23 Manual HSS LTR 5" x5ea Bridge with clam RTB 2 x 10 Hip hikes 6" step 2x10 ea  Standing hip abduction/ext RTB at ankles 2 x 10 bilateral SLS without hip drop 2x10sc ea (fingertip support)  06/11/23 Manual HSS and education on how to perform at home Prone hip extension 2 x 10 bilateral  Bridge with clam RTB 2 x  10 Hip hikes 6" step 2x10 ea  Resisted retro gait 10# 1 x 10    06/09/23  LTR 5" x10ea Manual HSS Manual ER hip stretch L STM to L gluteal mm Instruction in Bridge 2 x 10 3sec hold Sidelying hip abduction 3x10 bil Gait in hall- x44ft x2 Modified Thomas stretch 3x30sec ea  HEP update   PATIENT EDUCATION:  Education details: Patient educated on exam findings, POC, scope of PT, HEP. 05/14/23: HEP Person educated: Patient Education method: Programmer, multimedia, Demonstration, and Handouts Education comprehension: verbalized understanding, returned demonstration, verbal cues required, and tactile cues required  HOME EXERCISE PROGRAM: Access Code: [email or web address] URL: [email or web address]  Date: 04/30/2023 - Standing Hip Abduction with Counter Support  - 2 x daily - 7 x weekly - 2 sets - 10 reps - Standing Hip Extension with Counter Support  - 2 x daily - 7 x weekly - 2 sets - 10 reps  05/14/23 - Supine Bridge  - 1 x daily - 7 x weekly - 2 sets - 10 reps - Supine Lower Trunk Rotation  - 1 x daily - 7 x weekly - 10 reps - 5 second hold - Abdominal Bracing  - 1 x daily - 7 x weekly - 1 sets - 10 reps - 5 second hold - Supine March  - 1 x daily - 7 x weekly - 2 sets - 10 reps - Step Up  - 1 x daily - 7 x weekly - 2 sets - 10 reps - Side Stepping with Counter Support  - 1 x daily - 7 x weekly - 4 sets - 10 reps  05/17/23 - Standing Shoulder Row with Anchored Resistance  - 1 x daily - 7 x weekly - 2 sets - 10 reps - Shoulder extension with resistance - Neutral  - 1 x daily - 7 x weekly - 2 sets - 10 reps - Standing Anti-Rotation Press with Anchored Resistance  - 1 x daily - 7 x weekly - 2 sets - 10 reps  06/04/23 - Step Up  - 1 x daily - 7 x weekly - 3 sets - 15 reps - Lateral Step Up  - 1 x daily - 7 x weekly - 3 sets - 15 reps - Sit to Stand with Arms Crossed  - 1 x daily - 7 x weekly - 3 sets - 10 reps  ASSESSMENT:  CLINICAL IMPRESSION: Therapy re-assessed the patient today.  Her strength numbers are less but likely because of inter rater measuring differences. Her FOTO is about the same. Over the past month she has not been able to do much of her program 2nd to travel and other issues. Her sit to  stand balance is better. She feels like her balance is better. She has pain with hip external rotation on the left. Her lumbar movement has improved. She would benefit from further skilled therapy 2W6 to continue to work on reducing hip pain, increasing community mobility, and improving balance.   OBJECTIVE IMPAIRMENTS: Abnormal gait, decreased activity tolerance, decreased balance, decreased endurance, decreased mobility, difficulty walking, decreased ROM, decreased strength, increased muscle spasms, impaired flexibility, improper body mechanics, postural dysfunction, and pain.   ACTIVITY LIMITATIONS: carrying, lifting, bending, standing, squatting, transfers, hygiene/grooming, locomotion level, and caring for others  PARTICIPATION LIMITATIONS: meal prep, cleaning, laundry, shopping, community activity, and yard work  PERSONAL FACTORS: Fitness, Time since onset of injury/illness/exacerbation, and 3+ comorbidities: HTN, HLD, DM, hx back pain  are also affecting patient's functional outcome.   REHAB POTENTIAL: Good  CLINICAL DECISION MAKING: Stable/uncomplicated  EVALUATION COMPLEXITY: Low   GOALS: Goals reviewed with patient? Yes  SHORT TERM GOALS: Target date: 05/28/2023    Patient will be independent with HEP in order to improve functional outcomes. Baseline:  Goal status: MET (9/30)  2.  Patient will report at least 25% improvement in symptoms for improved quality of life. Baseline:  Goal status: MET  3. Patient will increase left hip ER to full without pain  Goal status: initial   4. Patient will increase right quad strength by 10 lbs  Goal status: initial    LONG TERM GOALS: Target date: 06/25/2023    Patient will report at least 75% improvement in  symptoms for improved quality of life. Baseline:  Goal status: IN PROGRESS  2.  Patient will improve FOTO score by at least 9 points in order to indicate improved tolerance to activity. Baseline: 53% function 06/18/23: 53% function 53% 12/09 Goal status: INITIAL  3.  Patient will demonstrate at least 25% improvement in lumbar ROM in all restricted planes for improved ability to move trunk while completing chores. Baseline:  Goal status: MET  4.  Patient will report improved confidence with gait, balance, and strength in roder to return to general exercise routine. Baseline:  Goal status: improving 12/9  5.  Patient will demonstrate 5lb increase in muscle testing in all tested musculature as evidence of improved strength to assist with stair ambulation and gait.   Baseline: see above Goal status: MET     PLAN:  PT FREQUENCY: 2x/week  PT DURATION: 8 weeks  PLANNED INTERVENTIONS: Therapeutic exercises, Therapeutic activity, Neuromuscular re-education, Balance training, Gait training, Patient/Family education, Joint manipulation, Joint mobilization, Stair training, Orthotic/Fit training, DME instructions, Aquatic Therapy, Dry Needling, Electrical stimulation, Spinal manipulation, Spinal mobilization, Cryotherapy, Moist heat, Compression bandaging, scar mobilization, Splintting, Taping, Traction, Ultrasound, Ionotophoresis 4mg /ml Dexamethasone, and Manual therapy  PLAN FOR NEXT SESSION: test balance, hip strength, functional strength, lumbar mobility, postural and core strength   Lorayne Bender PT DPT  Referring diagnosis? M54.42 (ICD-10-CM) - Left-sided low back pain with left-sided sciatica, unspecified chronicity Treatment diagnosis? (if different than referring diagnosis) M54.59 What was this (referring dx) caused by? []  Surgery [x]  Fall [x]  Ongoing issue []  Arthritis []  Other: ____________  Laterality: []  Rt []  Lt [x]  Both  Check all possible CPT codes:  *CHOOSE 10 OR  LESS*    []  97110 (Therapeutic Exercise)  []  92507 (SLP Treatment)  []  97112 (Neuro Re-ed)   []  92526 (Swallowing Treatment)   a 09811 (Gait Training)   []  [id number] (Cognitive Training, 1st 15 minutes) []  97140 (Manual Therapy)   []  97130 (Cognitive Training, each add'l 15  minutes)  []  97164 (Re-evaluation)                              []  Other, List CPT Code ____________  []  97530 (Therapeutic Activities)     []  97535 (Self Care)   [x]  All codes above (97110 - 97535)  []  97012 (Mechanical Traction)  []  97014 (E-stim Unattended)  []  97032 (E-stim manual)  []  97033 (Ionto)  []  97035 (Ultrasound) []  97750 (Physical Performance Training) []  [id number] (Aquatic Therapy) []  97016 (Vasopneumatic Device) []  [id number] (Paraffin) []  97034 (Contrast Bath) []  97597 (Wound Care 1st 20 sq cm) []  97598 (Wound Care each add'l 20 sq cm) []  97760 (Orthotic Fabrication, Fitting, Training Initial) []  [id number] (Prosthetic Management and Training Initial) []  [phone] (Orthotic or Prosthetic Training/ Modification Subsequent)

## 2023-08-13 ENCOUNTER — Encounter (HOSPITAL_BASED_OUTPATIENT_CLINIC_OR_DEPARTMENT_OTHER): Payer: Medicare PPO | Admitting: Physical Therapy

## 2023-08-18 ENCOUNTER — Ambulatory Visit (HOSPITAL_BASED_OUTPATIENT_CLINIC_OR_DEPARTMENT_OTHER): Payer: Medicare PPO

## 2023-08-18 ENCOUNTER — Encounter (HOSPITAL_BASED_OUTPATIENT_CLINIC_OR_DEPARTMENT_OTHER): Payer: Self-pay

## 2023-08-18 DIAGNOSIS — R2689 Other abnormalities of gait and mobility: Secondary | ICD-10-CM

## 2023-08-18 DIAGNOSIS — M5459 Other low back pain: Secondary | ICD-10-CM | POA: Diagnosis not present

## 2023-08-18 DIAGNOSIS — R29898 Other symptoms and signs involving the musculoskeletal system: Secondary | ICD-10-CM

## 2023-08-18 DIAGNOSIS — M6281 Muscle weakness (generalized): Secondary | ICD-10-CM

## 2023-08-18 NOTE — Therapy (Signed)
OUTPATIENT PHYSICAL THERAPY TREATMENT   Patient Name: Joanne Mendoza MRN: [id number] DOB:11-02-1945, 77 y.o., female Today's Date: 08/18/2023    END OF SESSION:  PT End of Session - 08/18/23 1352     Visit Number 14    Number of Visits 32    Date for PT Re-Evaluation 10/06/23    Authorization Type [employer]    Authorization - Visit Number 13    Authorization - Number of Visits 16    Progress Note Due on Visit 20    PT Start Time 1352    PT Stop Time 1430    PT Time Calculation (min) 38 min    Activity Tolerance Patient tolerated treatment well    Behavior During Therapy WFL for tasks assessed/performed              Past Medical History:  Diagnosis Date   Hyperlipidemia    Hypertension    Past Surgical History:  Procedure Laterality Date   broken arm     CESAREAN SECTION     x 2    Patient Active Problem List   Diagnosis Date Noted   Hyperlipidemia associated with type 2 diabetes mellitus (HCC) 10/02/2018   White coat hypertension 09/07/2013   Severe obesity (BMI >= 40) (HCC) 05/21/2013   Type 2 diabetes mellitus with hyperglycemia (HCC) 05/20/2013   Essential hypertension 12/12/2008   NONSPECIFIC ABN FINDING RAD & OTH EXAM GU ORGAN 12/12/2008    PCP: Kristian Covey, MD  REFERRING PROVIDER: Kristian Covey, MD  REFERRING DIAG: [id number] (ICD-10-CM) - Left-sided low back pain with left-sided sciatica, unspecified chronicity  Rationale for Evaluation and Treatment: Rehabilitation  THERAPY DIAG:  Muscle weakness (generalized)  Other abnormalities of gait and mobility  Other symptoms and signs involving the musculoskeletal system  Other low back pain  ONSET DATE: chronic  SUBJECTIVE:                                                                                                                                                                                           SUBJECTIVE STATEMENT: Pt reports she feels that since she has been  feeling better she has been doing more. "I think I over did it this weekend. My hip is really hurting now."    EVAL: Patient states used to be more mobile but has decreased over last few years. Had a fall earlier this year when walking [place]. Some swelling and numbness in R knee but has been getting better. Some left leg issues as well. Patient states some back aches and pains. Sciatic symptoms L hip/low back to  L knee. Is afraid of falling so she limits activity which makes it worse, caught up in cycle.   PERTINENT HISTORY:  HTN, HLD, DM  PAIN:  Are you having pain? None at rest, 5/10 with activity.   PRECAUTIONS: None  WEIGHT BEARING RESTRICTIONS: No  FALLS:  Has patient fallen in last 6 months? Yes. Number of falls 1   PLOF: Independent  PATIENT GOALS: get more active again and feel better    OBJECTIVE: (objective measures from initial evaluation unless otherwise dated)  PATIENT SURVEYS:  FOTO 53% function 06/18/23: 53% function  SCREENING FOR RED FLAGS: Bowel or bladder incontinence: No Spinal tumors: No Cauda equina syndrome: No Compression fracture: No Abdominal aneurysm: No  COGNITION: Overall cognitive status: Within functional limits for tasks assessed     SENSATION: WFL   POSTURE: rounded shoulders, forward head, decreased lumbar lordosis, anterior pelvic tilt, and flexed trunk   PALPATION: Mild ttp lumbar paraspinals, L glutes  LUMBAR ROM:   AROM eval 12/9   Flexion 0% limited 0% limited   Extension 75% limited 50% limited 0% No pain   Right lateral flexion 25% limited 0% limited   Left lateral flexion 25% limited 0% limited   Right rotation     Left rotation      (Blank rows = not tested) * = pain/symptoms  LOWER EXTREMITY ROM:     Passive  Right eval Left eval  Hip flexion    Hip extension    Hip abduction    Hip adduction    Hip internal rotation    Hip external rotation    Knee flexion    Knee extension     Ankle dorsiflexion    Ankle plantarflexion    Ankle inversion    Ankle eversion     (Blank rows = not tested) * = pain/symptoms  LOWER EXTREMITY MMT:    MMT Right eval Left eval Right 05/14/23 Left  05/14/23 Right 10/16 Left 06/18/23 Right 12/09 Left 12/09  Hip flexion 40.5 39.1   55.4 54.1 39.9 37.1  Hip extension   4/5 4/5 4/5 4/5    Hip abduction 43.0 41.4 4-/5 4/5 72.9/ 4-/5 60.5/ 4-/5 43.7 36.5  Hip adduction          Hip internal rotation          Hip external rotation          Knee flexion          Knee extension 73.3 66.9   82 68.3 26.7 43.3  Ankle dorsiflexion          Ankle plantarflexion          Ankle inversion          Ankle eversion           (Blank rows = not tested) * = pain/symptoms   FUNCTIONAL TESTS:  5 times sit to stand: 9.53 seconds without UE support, mild unsteadiness no loss of balance   12/9 9 seconds      GAIT: Distance walked: 80 feet Assistive device utilized: None Level of assistance: Complete Independence Comments: forward trunk, decreased foot clearance, bilateral glute weakness  TODAY'S TREATMENT:  DATE:    12/16 LTR 5" x10ea Fig 4 stretching Piriformis stretching Thomas stretching Standing hip extension 2x10ea -gait in hall -heel slides with TAC -condensed HEP   12/9 Testing for re eval    Last visit: LTR 5" x10ea Bridge with clam RTB 2 x 10 Sidelying hip abduction 2x10ea Hip hikes 6" step 2x20 ea  Standing hip abduction/ext with 3# ankle weight at ankles 2 x 1s bilateral SLS without hip drop 2x20sc ea (fingertip support) Progressed with time) -Squats at mat table 2x10 elevated, 2x10 lowered -gait in hall  06/18/23 Reassessment    06/16/23 Manual HSS LTR 5" x5ea Bridge with clam RTB 2 x 10 Hip hikes 6" step 2x10 ea  Standing hip abduction/ext RTB at ankles 2 x 10  bilateral SLS without hip drop 2x10sc ea (fingertip support)  06/11/23 Manual HSS and education on how to perform at home Prone hip extension 2 x 10 bilateral  Bridge with clam RTB 2 x 10 Hip hikes 6" step 2x10 ea  Resisted retro gait 10# 1 x 10    06/09/23  LTR 5" x10ea Manual HSS Manual ER hip stretch L STM to L gluteal mm Instruction in Bridge 2 x 10 3sec hold Sidelying hip abduction 3x10 bil Gait in hall- x81ft x2 Modified Thomas stretch 3x30sec ea  HEP update   PATIENT EDUCATION:  Education details: Patient educated on exam findings, POC, scope of PT, HEP. 05/14/23: HEP Person educated: Patient Education method: Programmer, multimedia, Demonstration, and Handouts Education comprehension: verbalized understanding, returned demonstration, verbal cues required, and tactile cues required  HOME EXERCISE PROGRAM: Access Code: [email or web address] URL: [email or web address]  Date: 04/30/2023 - Standing Hip Abduction with Counter Support  - 2 x daily - 7 x weekly - 2 sets - 10 reps - Standing Hip Extension with Counter Support  - 2 x daily - 7 x weekly - 2 sets - 10 reps  05/14/23 - Supine Bridge  - 1 x daily - 7 x weekly - 2 sets - 10 reps - Supine Lower Trunk Rotation  - 1 x daily - 7 x weekly - 10 reps - 5 second hold - Abdominal Bracing  - 1 x daily - 7 x weekly - 1 sets - 10 reps - 5 second hold - Supine March  - 1 x daily - 7 x weekly - 2 sets - 10 reps - Step Up  - 1 x daily - 7 x weekly - 2 sets - 10 reps - Side Stepping with Counter Support  - 1 x daily - 7 x weekly - 4 sets - 10 reps  05/17/23 - Standing Shoulder Row with Anchored Resistance  - 1 x daily - 7 x weekly - 2 sets - 10 reps - Shoulder extension with resistance - Neutral  - 1 x daily - 7 x weekly - 2 sets - 10 reps - Standing Anti-Rotation Press with Anchored Resistance  - 1 x daily - 7 x weekly - 2 sets - 10 reps  06/04/23 - Step Up  - 1 x daily - 7 x weekly - 3 sets - 15 reps - Lateral Step Up  - 1 x daily -  7 x weekly - 3 sets - 15 reps - Sit to Stand with Arms Crossed  - 1 x daily - 7 x weekly - 3 sets - 10 reps  ASSESSMENT:  CLINICAL IMPRESSION: Regressed with activity today due to increased pain level. She felt better after stretching program and noted improved gait pattern.  She continues to demonstrate hip flexor irritation with exercises. Reviewed core exercises pt can compete without  irritating this.   OBJECTIVE IMPAIRMENTS: Abnormal gait, decreased activity tolerance, decreased balance, decreased endurance, decreased mobility, difficulty walking, decreased ROM, decreased strength, increased muscle spasms, impaired flexibility, improper body mechanics, postural dysfunction, and pain.   ACTIVITY LIMITATIONS: carrying, lifting, bending, standing, squatting, transfers, hygiene/grooming, locomotion level, and caring for others  PARTICIPATION LIMITATIONS: meal prep, cleaning, laundry, shopping, community activity, and yard work  PERSONAL FACTORS: Fitness, Time since onset of injury/illness/exacerbation, and 3+ comorbidities: HTN, HLD, DM, hx back pain  are also affecting patient's functional outcome.   REHAB POTENTIAL: Good  CLINICAL DECISION MAKING: Stable/uncomplicated  EVALUATION COMPLEXITY: Low   GOALS: Goals reviewed with patient? Yes  SHORT TERM GOALS: Target date: 05/28/2023    Patient will be independent with HEP in order to improve functional outcomes. Baseline:  Goal status: MET (9/30)  2.  Patient will report at least 25% improvement in symptoms for improved quality of life. Baseline:  Goal status: MET  3. Patient will increase left hip ER to full without pain  Goal status: initial   4. Patient will increase right quad strength by 10 lbs  Goal status: initial    LONG TERM GOALS: Target date: 06/25/2023    Patient will report at least 75% improvement in symptoms for improved quality of life. Baseline:  Goal status: IN PROGRESS  2.  Patient will improve FOTO  score by at least 9 points in order to indicate improved tolerance to activity. Baseline: 53% function 06/18/23: 53% function 53% 12/09 Goal status: INITIAL  3.  Patient will demonstrate at least 25% improvement in lumbar ROM in all restricted planes for improved ability to move trunk while completing chores. Baseline:  Goal status: MET  4.  Patient will report improved confidence with gait, balance, and strength in roder to return to general exercise routine. Baseline:  Goal status: improving 12/9  5.  Patient will demonstrate 5lb increase in muscle testing in all tested musculature as evidence of improved strength to assist with stair ambulation and gait.   Baseline: see above Goal status: MET     PLAN:  PT FREQUENCY: 2x/week  PT DURATION: 8 weeks  PLANNED INTERVENTIONS: Therapeutic exercises, Therapeutic activity, Neuromuscular re-education, Balance training, Gait training, Patient/Family education, Joint manipulation, Joint mobilization, Stair training, Orthotic/Fit training, DME instructions, Aquatic Therapy, Dry Needling, Electrical stimulation, Spinal manipulation, Spinal mobilization, Cryotherapy, Moist heat, Compression bandaging, scar mobilization, Splintting, Taping, Traction, Ultrasound, Ionotophoresis 4mg /ml Dexamethasone, and Manual therapy  PLAN FOR NEXT SESSION: test balance, hip strength, functional strength, lumbar mobility, postural and core strength   Riki Altes, PTA   Referring diagnosis? M54.42 (ICD-10-CM) - Left-sided low back pain with left-sided sciatica, unspecified chronicity Treatment diagnosis? (if different than referring diagnosis) M54.59 What was this (referring dx) caused by? []  Surgery [x]  Fall [x]  Ongoing issue []  Arthritis []  Other: ____________  Laterality: []  Rt []  Lt [x]  Both  Check all possible CPT codes:  *CHOOSE 10 OR LESS*    []  97110 (Therapeutic Exercise)  []  92507 (SLP Treatment)  []  97112 (Neuro Re-ed)   []  92526  (Swallowing Treatment)   a [id number] (Gait Training)   []  62130 (Cognitive Training, 1st 15 minutes) []  97140 (Manual Therapy)   []  97130 (Cognitive Training, each add'l 15 minutes)  []  97164 (Re-evaluation)                              []   Other, List CPT Code ____________  []  97530 (Therapeutic Activities)     []  97535 (Self Care)   [x]  All codes above (97110 - 97535)  []  97012 (Mechanical Traction)  []  97014 (E-stim Unattended)  []  44010 (E-stim manual)  []  97033 (Ionto)  []  97035 (Ultrasound) []  97750 (Physical Performance Training) []  [id number] (Aquatic Therapy) []  97016 (Vasopneumatic Device) []  [id number] (Paraffin) []  97034 (Contrast Bath) []  97597 (Wound Care 1st 20 sq cm) []  97598 (Wound Care each add'l 20 sq cm) []  97760 (Orthotic Fabrication, Fitting, Training Initial) []  [id number] (Prosthetic Management and Training Initial) []  [id number] (Orthotic or Prosthetic Training/ Modification Subsequent)

## 2023-08-20 ENCOUNTER — Encounter (HOSPITAL_BASED_OUTPATIENT_CLINIC_OR_DEPARTMENT_OTHER): Payer: Self-pay | Admitting: Physical Therapy

## 2023-08-20 ENCOUNTER — Ambulatory Visit (HOSPITAL_BASED_OUTPATIENT_CLINIC_OR_DEPARTMENT_OTHER): Payer: Medicare PPO | Admitting: Physical Therapy

## 2023-08-20 DIAGNOSIS — M5459 Other low back pain: Secondary | ICD-10-CM

## 2023-08-20 DIAGNOSIS — M6281 Muscle weakness (generalized): Secondary | ICD-10-CM | POA: Diagnosis not present

## 2023-08-20 DIAGNOSIS — R2689 Other abnormalities of gait and mobility: Secondary | ICD-10-CM | POA: Diagnosis not present

## 2023-08-20 DIAGNOSIS — R29898 Other symptoms and signs involving the musculoskeletal system: Secondary | ICD-10-CM

## 2023-08-20 NOTE — Therapy (Signed)
OUTPATIENT PHYSICAL THERAPY TREATMENT   Patient Name: Joanne Mendoza MRN: [id number] DOB:1946-08-24, 77 y.o., female Today's Date: 08/20/2023    END OF SESSION:  PT End of Session - 08/20/23 1349     Visit Number 15    Number of Visits 32    Date for PT Re-Evaluation 10/06/23    Authorization Type [employer]    Authorization Time Period 20 visits approved  From 12.12.2024 - 02.03.2025    Authorization - Visit Number 2    Authorization - Number of Visits 20    Progress Note Due on Visit 23    PT Start Time 1349    PT Stop Time 1432    PT Time Calculation (min) 43 min    Activity Tolerance Patient tolerated treatment well    Behavior During Therapy WFL for tasks assessed/performed              Past Medical History:  Diagnosis Date   Hyperlipidemia    Hypertension    Past Surgical History:  Procedure Laterality Date   broken arm     CESAREAN SECTION     x 2    Patient Active Problem List   Diagnosis Date Noted   Hyperlipidemia associated with type 2 diabetes mellitus (HCC) 10/02/2018   White coat hypertension 09/07/2013   Severe obesity (BMI >= 40) (HCC) 05/21/2013   Type 2 diabetes mellitus with hyperglycemia (HCC) 05/20/2013   Essential hypertension 12/12/2008   NONSPECIFIC ABN FINDING RAD & OTH EXAM GU ORGAN 12/12/2008    PCP: Kristian Covey, MD  REFERRING PROVIDER: Kristian Covey, MD  REFERRING DIAG: [phone] (ICD-10-CM) - Left-sided low back pain with left-sided sciatica, unspecified chronicity  Rationale for Evaluation and Treatment: Rehabilitation  THERAPY DIAG:  Muscle weakness (generalized)  Other abnormalities of gait and mobility  Other symptoms and signs involving the musculoskeletal system  Other low back pain  ONSET DATE: chronic  SUBJECTIVE:                                                                                                                                                                                            SUBJECTIVE STATEMENT: Pt reports moving slow today. Been taking care of her mother. Thinks she may have overdid it some. A lot of family coming for holidays.    EVAL: Patient states used to be more mobile but has decreased over last few years. Had a fall earlier this year when walking [place]. Some swelling and numbness in R knee but has been getting better. Some left leg issues as well. Patient states some back  aches and pains. Sciatic symptoms L hip/low back to L knee. Is afraid of falling so she limits activity which makes it worse, caught up in cycle.   PERTINENT HISTORY:  HTN, HLD, DM  PAIN:  Are you having pain? None at rest, 7/10 with activity.   PRECAUTIONS: None  WEIGHT BEARING RESTRICTIONS: No  FALLS:  Has patient fallen in last 6 months? Yes. Number of falls 1   PLOF: Independent  PATIENT GOALS: get more active again and feel better    OBJECTIVE: (objective measures from initial evaluation unless otherwise dated)  PATIENT SURVEYS:  FOTO 53% function 06/18/23: 53% function  SCREENING FOR RED FLAGS: Bowel or bladder incontinence: No Spinal tumors: No Cauda equina syndrome: No Compression fracture: No Abdominal aneurysm: No  COGNITION: Overall cognitive status: Within functional limits for tasks assessed     SENSATION: WFL   POSTURE: rounded shoulders, forward head, decreased lumbar lordosis, anterior pelvic tilt, and flexed trunk   PALPATION: Mild ttp lumbar paraspinals, L glutes  LUMBAR ROM:   AROM eval 12/9   Flexion 0% limited 0% limited   Extension 75% limited 50% limited 0% No pain   Right lateral flexion 25% limited 0% limited   Left lateral flexion 25% limited 0% limited   Right rotation     Left rotation      (Blank rows = not tested) * = pain/symptoms  LOWER EXTREMITY ROM:     Passive  Right eval Left eval  Hip flexion    Hip extension    Hip abduction    Hip adduction    Hip internal rotation    Hip  external rotation    Knee flexion    Knee extension    Ankle dorsiflexion    Ankle plantarflexion    Ankle inversion    Ankle eversion     (Blank rows = not tested) * = pain/symptoms  LOWER EXTREMITY MMT:    MMT Right eval Left eval Right 05/14/23 Left  05/14/23 Right 10/16 Left 06/18/23 Right 12/09 Left 12/09  Hip flexion 40.5 39.1   55.4 54.1 39.9 37.1  Hip extension   4/5 4/5 4/5 4/5    Hip abduction 43.0 41.4 4-/5 4/5 72.9/ 4-/5 60.5/ 4-/5 43.7 36.5  Hip adduction          Hip internal rotation          Hip external rotation          Knee flexion          Knee extension 73.3 66.9   82 68.3 26.7 43.3  Ankle dorsiflexion          Ankle plantarflexion          Ankle inversion          Ankle eversion           (Blank rows = not tested) * = pain/symptoms   FUNCTIONAL TESTS:  5 times sit to stand: 9.53 seconds without UE support, mild unsteadiness no loss of balance   12/9 9 seconds      GAIT: Distance walked: 80 feet Assistive device utilized: None Level of assistance: Complete Independence Comments: forward trunk, decreased foot clearance, bilateral glute weakness  TODAY'S TREATMENT:  DATE:  08/20/23 LTR 5" x10ea Piriformis stretch 3 x 20 second holds Active hamstring stretch 5 x 10 second holds Sidelying clam GTB at knees 2 x 10  Standing hip flexor stretch at step 3 x 20 second holds Standing hip adductor stretch with over head reach 10 x 10 second holds  12/16 LTR 5" x10ea Fig 4 stretching Piriformis stretching Thomas stretching Standing hip extension 2x10ea -gait in hall -heel slides with TAC -condensed HEP   12/9 Testing for re eval    Last visit: LTR 5" x10ea Bridge with clam RTB 2 x 10 Sidelying hip abduction 2x10ea Hip hikes 6" step 2x20 ea  Standing hip abduction/ext with 3# ankle weight at ankles 2 x 1s  bilateral SLS without hip drop 2x20sc ea (fingertip support) Progressed with time) -Squats at mat table 2x10 elevated, 2x10 lowered -gait in hall  06/18/23 Reassessment    06/16/23 Manual HSS LTR 5" x5ea Bridge with clam RTB 2 x 10 Hip hikes 6" step 2x10 ea  Standing hip abduction/ext RTB at ankles 2 x 10 bilateral SLS without hip drop 2x10sc ea (fingertip support)  06/11/23 Manual HSS and education on how to perform at home Prone hip extension 2 x 10 bilateral  Bridge with clam RTB 2 x 10 Hip hikes 6" step 2x10 ea  Resisted retro gait 10# 1 x 10    06/09/23  LTR 5" x10ea Manual HSS Manual ER hip stretch L STM to L gluteal mm Instruction in Bridge 2 x 10 3sec hold Sidelying hip abduction 3x10 bil Gait in hall- x24ft x2 Modified Thomas stretch 3x30sec ea  HEP update   PATIENT EDUCATION:  Education details: Patient educated on exam findings, POC, scope of PT, HEP. 05/14/23: HEP Person educated: Patient Education method: Programmer, multimedia, Demonstration, and Handouts Education comprehension: verbalized understanding, returned demonstration, verbal cues required, and tactile cues required  HOME EXERCISE PROGRAM: Access Code: [email or web address] URL: [email or web address]  Date: 04/30/2023 - Standing Hip Abduction with Counter Support  - 2 x daily - 7 x weekly - 2 sets - 10 reps - Standing Hip Extension with Counter Support  - 2 x daily - 7 x weekly - 2 sets - 10 reps  05/14/23 - Supine Bridge  - 1 x daily - 7 x weekly - 2 sets - 10 reps - Supine Lower Trunk Rotation  - 1 x daily - 7 x weekly - 10 reps - 5 second hold - Abdominal Bracing  - 1 x daily - 7 x weekly - 1 sets - 10 reps - 5 second hold - Supine March  - 1 x daily - 7 x weekly - 2 sets - 10 reps - Step Up  - 1 x daily - 7 x weekly - 2 sets - 10 reps - Side Stepping with Counter Support  - 1 x daily - 7 x weekly - 4 sets - 10 reps  05/17/23 - Standing Shoulder Row with Anchored Resistance  - 1 x daily - 7 x  weekly - 2 sets - 10 reps - Shoulder extension with resistance - Neutral  - 1 x daily - 7 x weekly - 2 sets - 10 reps - Standing Anti-Rotation Press with Anchored Resistance  - 1 x daily - 7 x weekly - 2 sets - 10 reps  06/04/23 - Step Up  - 1 x daily - 7 x weekly - 3 sets - 15 reps - Lateral Step Up  - 1 x daily - 7 x weekly - 3  sets - 15 reps - Sit to Stand with Arms Crossed  - 1 x daily - 7 x weekly - 3 sets - 10 reps  ASSESSMENT:  CLINICAL IMPRESSION: Continued with previously completed mobility exercises which are tolerated well. Additional hip mobility exercises are completed with good mechanics. Improvement in symptoms at end of session.  Patient will continue to benefit from physical therapy in order to improve function and reduce impairment.   OBJECTIVE IMPAIRMENTS: Abnormal gait, decreased activity tolerance, decreased balance, decreased endurance, decreased mobility, difficulty walking, decreased ROM, decreased strength, increased muscle spasms, impaired flexibility, improper body mechanics, postural dysfunction, and pain.   ACTIVITY LIMITATIONS: carrying, lifting, bending, standing, squatting, transfers, hygiene/grooming, locomotion level, and caring for others  PARTICIPATION LIMITATIONS: meal prep, cleaning, laundry, shopping, community activity, and yard work  PERSONAL FACTORS: Fitness, Time since onset of injury/illness/exacerbation, and 3+ comorbidities: HTN, HLD, DM, hx back pain  are also affecting patient's functional outcome.   REHAB POTENTIAL: Good  CLINICAL DECISION MAKING: Stable/uncomplicated  EVALUATION COMPLEXITY: Low   GOALS: Goals reviewed with patient? Yes  SHORT TERM GOALS: Target date: 05/28/2023    Patient will be independent with HEP in order to improve functional outcomes. Baseline:  Goal status: MET (9/30)  2.  Patient will report at least 25% improvement in symptoms for improved quality of life. Baseline:  Goal status: MET  3. Patient will  increase left hip ER to full without pain  Goal status: initial   4. Patient will increase right quad strength by 10 lbs  Goal status: initial    LONG TERM GOALS: Target date: 06/25/2023    Patient will report at least 75% improvement in symptoms for improved quality of life. Baseline:  Goal status: IN PROGRESS  2.  Patient will improve FOTO score by at least 9 points in order to indicate improved tolerance to activity. Baseline: 53% function 06/18/23: 53% function 53% 12/09 Goal status: INITIAL  3.  Patient will demonstrate at least 25% improvement in lumbar ROM in all restricted planes for improved ability to move trunk while completing chores. Baseline:  Goal status: MET  4.  Patient will report improved confidence with gait, balance, and strength in roder to return to general exercise routine. Baseline:  Goal status: improving 12/9  5.  Patient will demonstrate 5lb increase in muscle testing in all tested musculature as evidence of improved strength to assist with stair ambulation and gait.   Baseline: see above Goal status: MET     PLAN:  PT FREQUENCY: 2x/week  PT DURATION: 8 weeks  PLANNED INTERVENTIONS: Therapeutic exercises, Therapeutic activity, Neuromuscular re-education, Balance training, Gait training, Patient/Family education, Joint manipulation, Joint mobilization, Stair training, Orthotic/Fit training, DME instructions, Aquatic Therapy, Dry Needling, Electrical stimulation, Spinal manipulation, Spinal mobilization, Cryotherapy, Moist heat, Compression bandaging, scar mobilization, Splintting, Taping, Traction, Ultrasound, Ionotophoresis 4mg /ml Dexamethasone, and Manual therapy  PLAN FOR NEXT SESSION: test balance, hip strength, functional strength, lumbar mobility, postural and core strength    Wyman Songster, PT 08/20/2023, 2:31 PM    Referring diagnosis? M54.42 (ICD-10-CM) - Left-sided low back pain with left-sided sciatica, unspecified  chronicity Treatment diagnosis? (if different than referring diagnosis) M54.59 What was this (referring dx) caused by? []  Surgery [x]  Fall [x]  Ongoing issue []  Arthritis []  Other: ____________  Laterality: []  Rt []  Lt [x]  Both  Check all possible CPT codes:  *CHOOSE 10 OR LESS*    []  97110 (Therapeutic Exercise)  []  92507 (SLP Treatment)  []  16109 (Neuro Re-ed)   []   09811 (Swallowing Treatment)   a [phone] Electrical engineer)   []  [phone] (Cognitive Training, 1st 15 minutes) []  97140 (Manual Therapy)   []  97130 (Cognitive Training, each add'l 15 minutes)  []  97164 (Re-evaluation)                              []  Other, List CPT Code ____________  []  97530 (Therapeutic Activities)     []  97535 (Self Care)   [x]  All codes above (97110 - 97535)  []  97012 (Mechanical Traction)  []  97014 (E-stim Unattended)  []  13086 (E-stim manual)  []  97033 (Ionto)  []  97035 (Ultrasound) []  97750 (Physical Performance Training) []  [id number] (Aquatic Therapy) []  97016 (Vasopneumatic Device) []  [id number] (Paraffin) []  97034 (Contrast Bath) []  97597 (Wound Care 1st 20 sq cm) []  97598 (Wound Care each add'l 20 sq cm) []  97760 (Orthotic Fabrication, Fitting, Training Initial) []  [id number] (Prosthetic Management and Training Initial) []  [id number] (Orthotic or Prosthetic Training/ Modification Subsequent)

## 2023-09-01 ENCOUNTER — Ambulatory Visit (HOSPITAL_BASED_OUTPATIENT_CLINIC_OR_DEPARTMENT_OTHER): Payer: Medicare PPO

## 2023-09-01 ENCOUNTER — Encounter (HOSPITAL_BASED_OUTPATIENT_CLINIC_OR_DEPARTMENT_OTHER): Payer: Self-pay

## 2023-09-01 DIAGNOSIS — M6281 Muscle weakness (generalized): Secondary | ICD-10-CM | POA: Diagnosis not present

## 2023-09-01 DIAGNOSIS — M5459 Other low back pain: Secondary | ICD-10-CM | POA: Diagnosis not present

## 2023-09-01 DIAGNOSIS — R29898 Other symptoms and signs involving the musculoskeletal system: Secondary | ICD-10-CM

## 2023-09-01 DIAGNOSIS — R2689 Other abnormalities of gait and mobility: Secondary | ICD-10-CM

## 2023-09-01 NOTE — Therapy (Signed)
OUTPATIENT PHYSICAL THERAPY TREATMENT   Patient Name: Joanne Mendoza MRN: [id number] DOB:[birth year], 77 y.o., female Today's Date: 09/01/2023    END OF SESSION:  PT End of Session - 09/01/23 1535     Visit Number 16    Number of Visits 32    Date for PT Re-Evaluation 10/06/23    Authorization Type [employer]    Authorization Time Period 20 visits approved  From 12.12.2024 - 02.03.2025    Authorization - Visit Number 3    Authorization - Number of Visits 20    Progress Note Due on Visit 23    PT Start Time 1347    PT Stop Time 1430    PT Time Calculation (min) 43 min    Activity Tolerance Patient tolerated treatment well    Behavior During Therapy WFL for tasks assessed/performed               Past Medical History:  Diagnosis Date   Hyperlipidemia    Hypertension    Past Surgical History:  Procedure Laterality Date   broken arm     CESAREAN SECTION     x 2    Patient Active Problem List   Diagnosis Date Noted   Hyperlipidemia associated with type 2 diabetes mellitus (HCC) 10/02/2018   White coat hypertension 09/07/2013   Severe obesity (BMI >= 40) (HCC) 05/21/2013   Type 2 diabetes mellitus with hyperglycemia (HCC) 05/20/2013   Essential hypertension 12/12/2008   NONSPECIFIC ABN FINDING RAD & OTH EXAM GU ORGAN 12/12/2008    PCP: Kristian Covey, MD  REFERRING PROVIDER: Kristian Covey, MD  REFERRING DIAG: [phone] (ICD-10-CM) - Left-sided low back pain with left-sided sciatica, unspecified chronicity  Rationale for Evaluation and Treatment: Rehabilitation  THERAPY DIAG:  Muscle weakness (generalized)  Other abnormalities of gait and mobility  Other symptoms and signs involving the musculoskeletal system  Other low back pain  ONSET DATE: chronic  SUBJECTIVE:                                                                                                                                                                                            SUBJECTIVE STATEMENT: Pt reports she overdid it over Christmas and feels flared up. Feels her balance and strength has improved overall.    EVAL: Patient states used to be more mobile but has decreased over last few years. Had a fall earlier this year when walking [place]. Some swelling and numbness in R knee but has been getting better. Some left leg issues as well. Patient states some back aches and pains. Sciatic symptoms  L hip/low back to L knee. Is afraid of falling so she limits activity which makes it worse, caught up in cycle.   PERTINENT HISTORY:  HTN, HLD, DM  PAIN:  Are you having pain? None at rest, 7/10 with activity.   PRECAUTIONS: None  WEIGHT BEARING RESTRICTIONS: No  FALLS:  Has patient fallen in last 6 months? Yes. Number of falls 1   PLOF: Independent  PATIENT GOALS: get more active again and feel better    OBJECTIVE: (objective measures from initial evaluation unless otherwise dated)  PATIENT SURVEYS:  FOTO 53% function 06/18/23: 53% function 12/9:54%  SCREENING FOR RED FLAGS: Bowel or bladder incontinence: No Spinal tumors: No Cauda equina syndrome: No Compression fracture: No Abdominal aneurysm: No  COGNITION: Overall cognitive status: Within functional limits for tasks assessed     SENSATION: WFL   POSTURE: rounded shoulders, forward head, decreased lumbar lordosis, anterior pelvic tilt, and flexed trunk   PALPATION: Mild ttp lumbar paraspinals, L glutes  LUMBAR ROM:   AROM eval 12/9   Flexion 0% limited 0% limited   Extension 75% limited 50% limited 0% No pain   Right lateral flexion 25% limited 0% limited   Left lateral flexion 25% limited 0% limited   Right rotation     Left rotation      (Blank rows = not tested) * = pain/symptoms  LOWER EXTREMITY ROM:     Passive  Right eval Left eval  Hip flexion    Hip extension    Hip abduction    Hip adduction    Hip internal rotation    Hip external  rotation    Knee flexion    Knee extension    Ankle dorsiflexion    Ankle plantarflexion    Ankle inversion    Ankle eversion     (Blank rows = not tested) * = pain/symptoms  LOWER EXTREMITY MMT:    MMT Right eval Left eval Right 05/14/23 Left  05/14/23 Right 10/16 Left 06/18/23 Right 12/09 Left 12/09  Hip flexion 40.5 39.1   55.4 54.1 39.9 37.1  Hip extension   4/5 4/5 4/5 4/5    Hip abduction 43.0 41.4 4-/5 4/5 72.9/ 4-/5 60.5/ 4-/5 43.7 36.5  Hip adduction          Hip internal rotation          Hip external rotation          Knee flexion          Knee extension 73.3 66.9   82 68.3 26.7 43.3  Ankle dorsiflexion          Ankle plantarflexion          Ankle inversion          Ankle eversion           (Blank rows = not tested) * = pain/symptoms   FUNCTIONAL TESTS:  5 times sit to stand: 9.53 seconds without UE support, mild unsteadiness no loss of balance   12/9 9 seconds      GAIT: Distance walked: 80 feet Assistive device utilized: None Level of assistance: Complete Independence Comments: forward trunk, decreased foot clearance, bilateral glute weakness  TODAY'S TREATMENT:  DATE:   12/30 STM and IASTM to R quads and hip flexors, R glutes LTR 5" x10ea Thomas stretching Sidelying clam GTB at knees 2x15  Standing hip flexion stretch (sliding LE posteriorly) Standing hip extension 2x10ea Hip hikes 4" step 3x20L -gait in hall   08/20/23 LTR 5" x10ea Piriformis stretch 3 x 20 second holds Active hamstring stretch 5 x 10 second holds Sidelying clam GTB at knees 2 x 10  Standing hip flexor stretch at step 3 x 20 second holds Standing hip adductor stretch with over head reach 10 x 10 second holds  12/16 LTR 5" x10ea Fig 4 stretching Piriformis stretching Thomas stretching Standing hip extension 2x10ea -gait in hall -heel  slides with TAC -condensed HEP   12/9 Testing for re eval    Last visit: LTR 5" x10ea Bridge with clam RTB 2 x 10 Sidelying hip abduction 2x10ea Hip hikes 6" step 2x20 ea  Standing hip abduction/ext with 3# ankle weight at ankles 2 x 1s bilateral SLS without hip drop 2x20sc ea (fingertip support) Progressed with time) -Squats at mat table 2x10 elevated, 2x10 lowered -gait in hall   PATIENT EDUCATION:  Education details: Patient educated on exam findings, POC, scope of PT, HEP. 05/14/23: HEP Person educated: Patient Education method: Programmer, multimedia, Demonstration, and Handouts Education comprehension: verbalized understanding, returned demonstration, verbal cues required, and tactile cues required  HOME EXERCISE PROGRAM: Access Code: [email or web address] URL: [email or web address]  Date: 04/30/2023 - Standing Hip Abduction with Counter Support  - 2 x daily - 7 x weekly - 2 sets - 10 reps - Standing Hip Extension with Counter Support  - 2 x daily - 7 x weekly - 2 sets - 10 reps  05/14/23 - Supine Bridge  - 1 x daily - 7 x weekly - 2 sets - 10 reps - Supine Lower Trunk Rotation  - 1 x daily - 7 x weekly - 10 reps - 5 second hold - Abdominal Bracing  - 1 x daily - 7 x weekly - 1 sets - 10 reps - 5 second hold - Supine March  - 1 x daily - 7 x weekly - 2 sets - 10 reps - Step Up  - 1 x daily - 7 x weekly - 2 sets - 10 reps - Side Stepping with Counter Support  - 1 x daily - 7 x weekly - 4 sets - 10 reps  05/17/23 - Standing Shoulder Row with Anchored Resistance  - 1 x daily - 7 x weekly - 2 sets - 10 reps - Shoulder extension with resistance - Neutral  - 1 x daily - 7 x weekly - 2 sets - 10 reps - Standing Anti-Rotation Press with Anchored Resistance  - 1 x daily - 7 x weekly - 2 sets - 10 reps  06/04/23 - Step Up  - 1 x daily - 7 x weekly - 3 sets - 15 reps - Lateral Step Up  - 1 x daily - 7 x weekly - 3 sets - 15 reps - Sit to Stand with Arms Crossed  - 1 x daily - 7 x weekly -  3 sets - 10 reps  ASSESSMENT:  CLINICAL IMPRESSION: Pt continues to be limited due to hip flexor pain and tightness. Spent time on manual techniques to decrease tightness in hip flexors. Continues with trendelenburg gait, though her steadiness has improved. She is able to complete hip hikes with good form for increased reps. Recommended pt make appt with ortho MD for assessment  due to ongoing pain and gait limitations.   OBJECTIVE IMPAIRMENTS: Abnormal gait, decreased activity tolerance, decreased balance, decreased endurance, decreased mobility, difficulty walking, decreased ROM, decreased strength, increased muscle spasms, impaired flexibility, improper body mechanics, postural dysfunction, and pain.   ACTIVITY LIMITATIONS: carrying, lifting, bending, standing, squatting, transfers, hygiene/grooming, locomotion level, and caring for others  PARTICIPATION LIMITATIONS: meal prep, cleaning, laundry, shopping, community activity, and yard work  PERSONAL FACTORS: Fitness, Time since onset of injury/illness/exacerbation, and 3+ comorbidities: HTN, HLD, DM, hx back pain  are also affecting patient's functional outcome.   REHAB POTENTIAL: Good  CLINICAL DECISION MAKING: Stable/uncomplicated  EVALUATION COMPLEXITY: Low   GOALS: Goals reviewed with patient? Yes  SHORT TERM GOALS: Target date: 05/28/2023    Patient will be independent with HEP in order to improve functional outcomes. Baseline:  Goal status: MET (9/30)  2.  Patient will report at least 25% improvement in symptoms for improved quality of life. Baseline:  Goal status: MET  3. Patient will increase left hip ER to full without pain  Goal status: initial   4. Patient will increase right quad strength by 10 lbs  Goal status: initial    LONG TERM GOALS: Target date: 06/25/2023    Patient will report at least 75% improvement in symptoms for improved quality of life. Baseline:  Goal status: IN PROGRESS  2.  Patient will  improve FOTO score by at least 9 points in order to indicate improved tolerance to activity. Baseline: 53% function 06/18/23: 53% function 53% 12/09 Goal status: INITIAL  3.  Patient will demonstrate at least 25% improvement in lumbar ROM in all restricted planes for improved ability to move trunk while completing chores. Baseline:  Goal status: MET  4.  Patient will report improved confidence with gait, balance, and strength in roder to return to general exercise routine. Baseline:  Goal status: improving 12/9  5.  Patient will demonstrate 5lb increase in muscle testing in all tested musculature as evidence of improved strength to assist with stair ambulation and gait.   Baseline: see above Goal status: MET     PLAN:  PT FREQUENCY: 2x/week  PT DURATION: 8 weeks  PLANNED INTERVENTIONS: Therapeutic exercises, Therapeutic activity, Neuromuscular re-education, Balance training, Gait training, Patient/Family education, Joint manipulation, Joint mobilization, Stair training, Orthotic/Fit training, DME instructions, Aquatic Therapy, Dry Needling, Electrical stimulation, Spinal manipulation, Spinal mobilization, Cryotherapy, Moist heat, Compression bandaging, scar mobilization, Splintting, Taping, Traction, Ultrasound, Ionotophoresis 4mg /ml Dexamethasone, and Manual therapy  PLAN FOR NEXT SESSION: test balance, hip strength, functional strength, lumbar mobility, postural and core strength    Donnel Saxon Jeson Camacho, PTA 09/01/2023, 3:36 PM    Referring diagnosis? M54.42 (ICD-10-CM) - Left-sided low back pain with left-sided sciatica, unspecified chronicity Treatment diagnosis? (if different than referring diagnosis) M54.59 What was this (referring dx) caused by? []  Surgery [x]  Fall [x]  Ongoing issue []  Arthritis []  Other: ____________  Laterality: []  Rt []  Lt [x]  Both  Check all possible CPT codes:  *CHOOSE 10 OR LESS*    []  97110 (Therapeutic Exercise)  []  92507 (SLP  Treatment)  []  97112 (Neuro Re-ed)   []  92526 (Swallowing Treatment)   a 16109 (Gait Training)   []  [id number] (Cognitive Training, 1st 15 minutes) []  97140 (Manual Therapy)   []  97130 (Cognitive Training, each add'l 15 minutes)  []  97164 (Re-evaluation)                              []   Other, List CPT Code ____________  []  97530 (Therapeutic Activities)     []  97535 (Self Care)   [x]  All codes above (97110 - 97535)  []  97012 (Mechanical Traction)  []  97014 (E-stim Unattended)  []  97032 (E-stim manual)  []  97033 (Ionto)  []  97035 (Ultrasound) []  97750 (Physical Performance Training) []  [id number] (Aquatic Therapy) []  97016 (Vasopneumatic Device) []  [id number] (Paraffin) []  97034 (Contrast Bath) []  [phone] (Wound Care 1st 20 sq cm) []  97598 (Wound Care each add'l 20 sq cm) []  97760 (Orthotic Fabrication, Fitting, Training Initial) []  [id number] (Prosthetic Management and Training Initial) []  [id number] (Orthotic or Prosthetic Training/ Modification Subsequent)

## 2023-09-03 ENCOUNTER — Other Ambulatory Visit: Payer: Self-pay | Admitting: Family Medicine

## 2023-09-04 ENCOUNTER — Ambulatory Visit (HOSPITAL_BASED_OUTPATIENT_CLINIC_OR_DEPARTMENT_OTHER): Payer: Medicare PPO | Attending: Family Medicine | Admitting: Physical Therapy

## 2023-09-04 ENCOUNTER — Encounter (HOSPITAL_BASED_OUTPATIENT_CLINIC_OR_DEPARTMENT_OTHER): Payer: Self-pay | Admitting: Physical Therapy

## 2023-09-04 DIAGNOSIS — M5459 Other low back pain: Secondary | ICD-10-CM | POA: Insufficient documentation

## 2023-09-04 DIAGNOSIS — R29898 Other symptoms and signs involving the musculoskeletal system: Secondary | ICD-10-CM | POA: Diagnosis not present

## 2023-09-04 DIAGNOSIS — R2689 Other abnormalities of gait and mobility: Secondary | ICD-10-CM | POA: Insufficient documentation

## 2023-09-04 DIAGNOSIS — M6281 Muscle weakness (generalized): Secondary | ICD-10-CM | POA: Diagnosis not present

## 2023-09-04 NOTE — Therapy (Signed)
 OUTPATIENT PHYSICAL THERAPY TREATMENT   Patient Name: Joanne Mendoza MRN: [id number] DOB:09-Jun-1946, 78 y.o., female Today's Date: 09/04/2023    END OF SESSION:  PT End of Session - 09/04/23 1349     Visit Number 17    Number of Visits 32    Date for PT Re-Evaluation 10/06/23    Authorization Type [employer]    Authorization Time Period 20 visits approved  From 12.12.2024 - 02.03.2025    Authorization - Visit Number 4    Authorization - Number of Visits 20    Progress Note Due on Visit 23    PT Start Time 1349    PT Stop Time 1430    PT Time Calculation (min) 41 min    Activity Tolerance Patient tolerated treatment well    Behavior During Therapy [facility] for tasks assessed/performed               Past Medical History:  Diagnosis Date   Hyperlipidemia    Hypertension    Past Surgical History:  Procedure Laterality Date   broken arm     CESAREAN SECTION     x 2    Patient Active Problem List   Diagnosis Date Noted   Hyperlipidemia associated with type 2 diabetes mellitus (HCC) 10/02/2018   White coat hypertension 09/07/2013   Severe obesity (BMI >= 40) (HCC) 05/21/2013   Type 2 diabetes mellitus with hyperglycemia (HCC) 05/20/2013   Essential hypertension 12/12/2008   NONSPECIFIC ABN FINDING RAD & OTH EXAM GU ORGAN 12/12/2008    PCP: Micheal Wolm ORN, MD  REFERRING PROVIDER: Micheal Wolm ORN, MD  REFERRING DIAG: [phone] (ICD-10-CM) - Left-sided low back pain with left-sided sciatica, unspecified chronicity  Rationale for Evaluation and Treatment: Rehabilitation  THERAPY DIAG:  Muscle weakness (generalized)  Other abnormalities of gait and mobility  Other symptoms and signs involving the musculoskeletal system  Other low back pain  ONSET DATE: chronic  SUBJECTIVE:                                                                                                                                                                                            SUBJECTIVE STATEMENT: Pt reports that she felt better after last session. Some soreness that got better. Continued issues if she steps wrong or is on her leg for a while. Feeling better though.    EVAL: Patient states used to be more mobile but has decreased over last few years. Had a fall earlier this year when walking [place]. Some swelling and numbness in R knee but has been getting better. Some left leg  issues as well. Patient states some back aches and pains. Sciatic symptoms L hip/low back to L knee. Is afraid of falling so she limits activity which makes it worse, caught up in cycle.   PERTINENT HISTORY:  HTN, HLD, DM  PAIN:  Are you having pain? None at rest, 5-8/10 with activity.   PRECAUTIONS: None  WEIGHT BEARING RESTRICTIONS: No  FALLS:  Has patient fallen in last 6 months? Yes. Number of falls 1   PLOF: Independent  PATIENT GOALS: get more active again and feel better    OBJECTIVE: (objective measures from initial evaluation unless otherwise dated)  PATIENT SURVEYS:  FOTO 53% function 06/18/23: 53% function 12/9:54%  SCREENING FOR RED FLAGS: Bowel or bladder incontinence: No Spinal tumors: No Cauda equina syndrome: No Compression fracture: No Abdominal aneurysm: No  COGNITION: Overall cognitive status: Within functional limits for tasks assessed     SENSATION: WFL   POSTURE: rounded shoulders, forward head, decreased lumbar lordosis, anterior pelvic tilt, and flexed trunk   PALPATION: Mild ttp lumbar paraspinals, L glutes  LUMBAR ROM:   AROM eval 12/9   Flexion 0% limited 0% limited   Extension 75% limited 50% limited 0% No pain   Right lateral flexion 25% limited 0% limited   Left lateral flexion 25% limited 0% limited   Right rotation     Left rotation      (Blank rows = not tested) * = pain/symptoms  LOWER EXTREMITY ROM:     Passive  Right eval Left eval  Hip flexion    Hip extension    Hip abduction    Hip  adduction    Hip internal rotation    Hip external rotation    Knee flexion    Knee extension    Ankle dorsiflexion    Ankle plantarflexion    Ankle inversion    Ankle eversion     (Blank rows = not tested) * = pain/symptoms  LOWER EXTREMITY MMT:    MMT Right eval Left eval Right 05/14/23 Left  05/14/23 Right 10/16 Left 06/18/23 Right 12/09 Left 12/09  Hip flexion 40.5 39.1   55.4 54.1 39.9 37.1  Hip extension   4/5 4/5 4/5 4/5    Hip abduction 43.0 41.4 4-/5 4/5 72.9/ 4-/5 60.5/ 4-/5 43.7 36.5  Hip adduction          Hip internal rotation          Hip external rotation          Knee flexion          Knee extension 73.3 66.9   82 68.3 26.7 43.3  Ankle dorsiflexion          Ankle plantarflexion          Ankle inversion          Ankle eversion           (Blank rows = not tested) * = pain/symptoms   FUNCTIONAL TESTS:  5 times sit to stand: 9.53 seconds without UE support, mild unsteadiness no loss of balance   12/9 9 seconds      GAIT: Distance walked: 80 feet Assistive device utilized: None Level of assistance: Complete Independence Comments: forward trunk, decreased foot clearance, bilateral glute weakness  TODAY'S TREATMENT:  DATE:  09/04/23 Sidelying clam BTB at knees 3x10 Standing hip flexor stretch 5 x 20 second holds Hip hikes 4 inch step 3 x 20 bilateral PPT with ab set 3 x 10   12/30 STM and IASTM to R quads and hip flexors, R glutes LTR 5 x10ea Thomas stretching Sidelying clam GTB at knees 2x15  Standing hip flexion stretch (sliding LE posteriorly) Standing hip extension 2x10ea Hip hikes 4 step 3x20L -gait in hall   08/20/23 LTR 5 x10ea Piriformis stretch 3 x 20 second holds Active hamstring stretch 5 x 10 second holds Sidelying clam GTB at knees 2 x 10  Standing hip flexor stretch at step 3 x 20 second  holds Standing hip adductor stretch with over head reach 10 x 10 second holds  12/16 LTR 5 x10ea Fig 4 stretching Piriformis stretching Thomas stretching Standing hip extension 2x10ea -gait in hall -heel slides with TAC -condensed HEP   12/9 Testing for re eval    Last visit: LTR 5 x10ea Bridge with clam RTB 2 x 10 Sidelying hip abduction 2x10ea Hip hikes 6 step 2x20 ea  Standing hip abduction/ext with 3# ankle weight at ankles 2 x 1s bilateral SLS without hip drop 2x20sc ea (fingertip support) Progressed with time) -Squats at mat table 2x10 elevated, 2x10 lowered -gait in hall   PATIENT EDUCATION:  Education details: Patient educated on exam findings, POC, scope of PT, HEP. 05/14/23: HEP Person educated: Patient Education method: Programmer, Multimedia, Demonstration, and Handouts Education comprehension: verbalized understanding, returned demonstration, verbal cues required, and tactile cues required  HOME EXERCISE PROGRAM: Access Code: [email or web address] URL: [email or web address]  Date: 04/30/2023 - Standing Hip Abduction with Counter Support  - 2 x daily - 7 x weekly - 2 sets - 10 reps - Standing Hip Extension with Counter Support  - 2 x daily - 7 x weekly - 2 sets - 10 reps  05/14/23 - Supine Bridge  - 1 x daily - 7 x weekly - 2 sets - 10 reps - Supine Lower Trunk Rotation  - 1 x daily - 7 x weekly - 10 reps - 5 second hold - Abdominal Bracing  - 1 x daily - 7 x weekly - 1 sets - 10 reps - 5 second hold - Supine March  - 1 x daily - 7 x weekly - 2 sets - 10 reps - Step Up  - 1 x daily - 7 x weekly - 2 sets - 10 reps - Side Stepping with Counter Support  - 1 x daily - 7 x weekly - 4 sets - 10 reps  05/17/23 - Standing Shoulder Row with Anchored Resistance  - 1 x daily - 7 x weekly - 2 sets - 10 reps - Shoulder extension with resistance - Neutral  - 1 x daily - 7 x weekly - 2 sets - 10 reps - Standing Anti-Rotation Press with Anchored Resistance  - 1 x daily - 7 x  weekly - 2 sets - 10 reps  06/04/23 - Step Up  - 1 x daily - 7 x weekly - 3 sets - 15 reps - Lateral Step Up  - 1 x daily - 7 x weekly - 3 sets - 15 reps - Sit to Stand with Arms Crossed  - 1 x daily - 7 x weekly - 3 sets - 10 reps  ASSESSMENT:  CLINICAL IMPRESSION: Patient with continued hip symptoms likely due to weakness and overall posture. Continued with glute strength and began ab sets/PPT to  begin to incorporate more into gait/exercise. Patient will continue to benefit from physical therapy in order to improve function and reduce impairment.   OBJECTIVE IMPAIRMENTS: Abnormal gait, decreased activity tolerance, decreased balance, decreased endurance, decreased mobility, difficulty walking, decreased ROM, decreased strength, increased muscle spasms, impaired flexibility, improper body mechanics, postural dysfunction, and pain.   ACTIVITY LIMITATIONS: carrying, lifting, bending, standing, squatting, transfers, hygiene/grooming, locomotion level, and caring for others  PARTICIPATION LIMITATIONS: meal prep, cleaning, laundry, shopping, community activity, and yard work  PERSONAL FACTORS: Fitness, Time since onset of injury/illness/exacerbation, and 3+ comorbidities: HTN, HLD, DM, hx back pain  are also affecting patient's functional outcome.   REHAB POTENTIAL: Good  CLINICAL DECISION MAKING: Stable/uncomplicated  EVALUATION COMPLEXITY: Low   GOALS: Goals reviewed with patient? Yes  SHORT TERM GOALS: Target date: 05/28/2023    Patient will be independent with HEP in order to improve functional outcomes. Baseline:  Goal status: MET (9/30)  2.  Patient will report at least 25% improvement in symptoms for improved quality of life. Baseline:  Goal status: MET  3. Patient will increase left hip ER to full without pain  Goal status: initial   4. Patient will increase right quad strength by 10 lbs  Goal status: initial    LONG TERM GOALS: Target date: 06/25/2023    Patient  will report at least 75% improvement in symptoms for improved quality of life. Baseline:  Goal status: IN PROGRESS  2.  Patient will improve FOTO score by at least 9 points in order to indicate improved tolerance to activity. Baseline: 53% function 06/18/23: 53% function 53% 12/09 Goal status: INITIAL  3.  Patient will demonstrate at least 25% improvement in lumbar ROM in all restricted planes for improved ability to move trunk while completing chores. Baseline:  Goal status: MET  4.  Patient will report improved confidence with gait, balance, and strength in roder to return to general exercise routine. Baseline:  Goal status: improving 12/9  5.  Patient will demonstrate 5lb increase in muscle testing in all tested musculature as evidence of improved strength to assist with stair ambulation and gait.   Baseline: see above Goal status: MET     PLAN:  PT FREQUENCY: 2x/week  PT DURATION: 8 weeks  PLANNED INTERVENTIONS: Therapeutic exercises, Therapeutic activity, Neuromuscular re-education, Balance training, Gait training, Patient/Family education, Joint manipulation, Joint mobilization, Stair training, Orthotic/Fit training, DME instructions, Aquatic Therapy, Dry Needling, Electrical stimulation, Spinal manipulation, Spinal mobilization, Cryotherapy, Moist heat, Compression bandaging, scar mobilization, Splintting, Taping, Traction, Ultrasound, Ionotophoresis 4mg /ml Dexamethasone , and Manual therapy  PLAN FOR NEXT SESSION: test balance, hip strength, functional strength, lumbar mobility, postural and core strength    Prentice GORMAN Stains, PT 09/04/2023, 2:31 PM    Referring diagnosis? M54.42 (ICD-10-CM) - Left-sided low back pain with left-sided sciatica, unspecified chronicity Treatment diagnosis? (if different than referring diagnosis) M54.59 What was this (referring dx) caused by? []  Surgery [x]  Fall [x]  Ongoing issue []  Arthritis []  Other: ____________  Laterality: []   Rt []  Lt [x]  Both  Check all possible CPT codes:  *CHOOSE 10 OR LESS*    []  97110 (Therapeutic Exercise)  []  92507 (SLP Treatment)  []  02887 (Neuro Re-ed)   []  92526 (Swallowing Treatment)   a 02883 (Gait Training)   []  [id number] (Cognitive Training, 1st 15 minutes) []  97140 (Manual Therapy)   []  97130 (Cognitive Training, each add'l 15 minutes)  []  97164 (Re-evaluation)                              []   Other, List CPT Code ____________  []  97530 (Therapeutic Activities)     []  97535 (Self Care)   [x]  All codes above (97110 - 97535)  []  97012 (Mechanical Traction)  []  97014 (E-stim Unattended)  []  02967 (E-stim manual)  []  97033 (Ionto)  []  97035 (Ultrasound) []  97750 (Physical Performance Training) []  [id number] (Aquatic Therapy) []  97016 (Vasopneumatic Device) []  [id number] (Paraffin) []  97034 (Contrast Bath) []  97597 (Wound Care 1st 20 sq cm) []  97598 (Wound Care each add'l 20 sq cm) []  97760 (Orthotic Fabrication, Fitting, Training Initial) []  [id number] (Prosthetic Management and Training Initial) []  [id number] (Orthotic or Prosthetic Training/ Modification Subsequent)

## 2023-09-06 ENCOUNTER — Other Ambulatory Visit: Payer: Self-pay | Admitting: Family Medicine

## 2023-09-08 ENCOUNTER — Other Ambulatory Visit: Payer: Self-pay

## 2023-09-08 DIAGNOSIS — I10 Essential (primary) hypertension: Secondary | ICD-10-CM

## 2023-09-08 MED ORDER — LOSARTAN POTASSIUM-HCTZ 100-12.5 MG PO TABS: 1.0000 | ORAL_TABLET | Freq: Every day | ORAL | 3 refills | Status: DC

## 2023-09-08 MED ORDER — AMLODIPINE BESYLATE 5 MG PO TABS: 5.0000 mg | ORAL_TABLET | Freq: Every day | ORAL | 1 refills | Status: DC

## 2023-09-08 MED ORDER — ATORVASTATIN CALCIUM 40 MG PO TABS: 40.0000 mg | ORAL_TABLET | Freq: Every day | ORAL | 1 refills | Status: DC

## 2023-09-08 MED ORDER — METOPROLOL SUCCINATE ER 50 MG PO TB24: 50.0000 mg | ORAL_TABLET | Freq: Every day | ORAL | 1 refills | Status: DC

## 2023-09-08 MED ORDER — METFORMIN HCL 500 MG PO TABS: ORAL_TABLET | ORAL | 1 refills | Status: DC

## 2023-09-08 NOTE — Telephone Encounter (Signed)
 Pt has an appt with md  10-01-2023 Please refill today

## 2023-09-08 NOTE — Telephone Encounter (Signed)
 Pt was wondering if this can be filled today. She is completely out.   Please advise.

## 2023-09-08 NOTE — Telephone Encounter (Signed)
 Pt has an appt on 10-01-2023 and would like rx to be refill tdoay

## 2023-09-09 ENCOUNTER — Ambulatory Visit (HOSPITAL_BASED_OUTPATIENT_CLINIC_OR_DEPARTMENT_OTHER): Payer: Medicare PPO | Admitting: Physical Therapy

## 2023-09-10 ENCOUNTER — Encounter (HOSPITAL_BASED_OUTPATIENT_CLINIC_OR_DEPARTMENT_OTHER): Payer: Self-pay

## 2023-09-10 ENCOUNTER — Ambulatory Visit (HOSPITAL_BASED_OUTPATIENT_CLINIC_OR_DEPARTMENT_OTHER): Payer: Medicare PPO

## 2023-09-10 DIAGNOSIS — R29898 Other symptoms and signs involving the musculoskeletal system: Secondary | ICD-10-CM

## 2023-09-10 DIAGNOSIS — R2689 Other abnormalities of gait and mobility: Secondary | ICD-10-CM | POA: Diagnosis not present

## 2023-09-10 DIAGNOSIS — M6281 Muscle weakness (generalized): Secondary | ICD-10-CM | POA: Diagnosis not present

## 2023-09-10 DIAGNOSIS — M5459 Other low back pain: Secondary | ICD-10-CM | POA: Diagnosis not present

## 2023-09-10 NOTE — Therapy (Signed)
 OUTPATIENT PHYSICAL THERAPY TREATMENT   Patient Name: Joanne Mendoza MRN: [id number] DOB:[birth year], 78 y.o., female Today's Date: 09/10/2023    END OF SESSION:  PT End of Session - 09/10/23 1519     Visit Number 18    Number of Visits 32    Date for PT Re-Evaluation 10/06/23    Authorization Type [employer]    Authorization Time Period 20 visits approved  From 12.12.2024 - 02.03.2025    Authorization - Visit Number 5    Authorization - Number of Visits 20    Progress Note Due on Visit 23    PT Start Time 1522    PT Stop Time 1600    PT Time Calculation (min) 38 min    Activity Tolerance Patient tolerated treatment well    Behavior During Therapy WFL for tasks assessed/performed                Past Medical History:  Diagnosis Date   Hyperlipidemia    Hypertension    Past Surgical History:  Procedure Laterality Date   broken arm     CESAREAN SECTION     x 2    Patient Active Problem List   Diagnosis Date Noted   Hyperlipidemia associated with type 2 diabetes mellitus (HCC) 10/02/2018   White coat hypertension 09/07/2013   Severe obesity (BMI >= 40) (HCC) 05/21/2013   Type 2 diabetes mellitus with hyperglycemia (HCC) 05/20/2013   Essential hypertension 12/12/2008   NONSPECIFIC ABN FINDING RAD & OTH EXAM GU ORGAN 12/12/2008    PCP: Micheal Wolm ORN, MD  REFERRING PROVIDER: Micheal Wolm ORN, MD  REFERRING DIAG: [phone] (ICD-10-CM) - Left-sided low back pain with left-sided sciatica, unspecified chronicity  Rationale for Evaluation and Treatment: Rehabilitation  THERAPY DIAG:  Other low back pain  Other symptoms and signs involving the musculoskeletal system  Other abnormalities of gait and mobility  Muscle weakness (generalized)  ONSET DATE: chronic  SUBJECTIVE:                                                                                                                                                                                            SUBJECTIVE STATEMENT: Pt reports that her hip is doing better overall. Does have increased discomfort by end of day, especially with increased activity.   EVAL: Patient states used to be more mobile but has decreased over last few years. Had a fall earlier this year when walking [place]. Some swelling and numbness in R knee but has been getting better. Some left leg issues as well. Patient states some back aches and pains.  Sciatic symptoms L hip/low back to L knee. Is afraid of falling so she limits activity which makes it worse, caught up in cycle.   PERTINENT HISTORY:  HTN, HLD, DM  PAIN:  Are you having pain? None at rest, 5-8/10 with activity.   PRECAUTIONS: None  WEIGHT BEARING RESTRICTIONS: No  FALLS:  Has patient fallen in last 6 months? Yes. Number of falls 1   PLOF: Independent  PATIENT GOALS: get more active again and feel better    OBJECTIVE: (objective measures from initial evaluation unless otherwise dated)  PATIENT SURVEYS:  FOTO 53% function 06/18/23: 53% function 12/9:54%  SCREENING FOR RED FLAGS: Bowel or bladder incontinence: No Spinal tumors: No Cauda equina syndrome: No Compression fracture: No Abdominal aneurysm: No  COGNITION: Overall cognitive status: Within functional limits for tasks assessed     SENSATION: WFL   POSTURE: rounded shoulders, forward head, decreased lumbar lordosis, anterior pelvic tilt, and flexed trunk   PALPATION: Mild ttp lumbar paraspinals, L glutes  LUMBAR ROM:   AROM eval 12/9   Flexion 0% limited 0% limited   Extension 75% limited 50% limited 0% No pain   Right lateral flexion 25% limited 0% limited   Left lateral flexion 25% limited 0% limited   Right rotation     Left rotation      (Blank rows = not tested) * = pain/symptoms  LOWER EXTREMITY ROM:     Passive  Right eval Left eval  Hip flexion    Hip extension    Hip abduction    Hip adduction    Hip internal rotation     Hip external rotation    Knee flexion    Knee extension    Ankle dorsiflexion    Ankle plantarflexion    Ankle inversion    Ankle eversion     (Blank rows = not tested) * = pain/symptoms  LOWER EXTREMITY MMT:    MMT Right eval Left eval Right 05/14/23 Left  05/14/23 Right 10/16 Left 06/18/23 Right 12/09 Left 12/09  Hip flexion 40.5 39.1   55.4 54.1 39.9 37.1  Hip extension   4/5 4/5 4/5 4/5    Hip abduction 43.0 41.4 4-/5 4/5 72.9/ 4-/5 60.5/ 4-/5 43.7 36.5  Hip adduction          Hip internal rotation          Hip external rotation          Knee flexion          Knee extension 73.3 66.9   82 68.3 26.7 43.3  Ankle dorsiflexion          Ankle plantarflexion          Ankle inversion          Ankle eversion           (Blank rows = not tested) * = pain/symptoms   FUNCTIONAL TESTS:  5 times sit to stand: 9.53 seconds without UE support, mild unsteadiness no loss of balance   12/9 9 seconds      GAIT: Distance walked: 80 feet Assistive device utilized: None Level of assistance: Complete Independence Comments: forward trunk, decreased foot clearance, bilateral glute weakness  TODAY'S TREATMENT:  DATE:   09/10/23 LTR x20 PPT with ab set 5 2x10 Sidelying clam 3 hold 2x15ea Hip hike off 4 step 3x20bil PPT against wall 3 seconds x 10 Gait in hall x2 laps Prone quad stretch with manual resistance bilaterally Prone hip extension x 10 bilaterally Instruction in [place] stretch with additional quad stretch.   09/04/23 Sidelying clam BTB at knees 3x10 Standing hip flexor stretch 5 x 20 second holds Hip hikes 4 inch step 3 x 20 bilateral PPT with ab set 3 x 10   12/30 STM and IASTM to R quads and hip flexors, R glutes LTR 5 x10ea Thomas stretching Sidelying clam GTB at knees 2x15  Standing hip flexion stretch (sliding LE  posteriorly) Standing hip extension 2x10ea Hip hikes 4 step 3x20L -gait in hall   08/20/23 LTR 5 x10ea Piriformis stretch 3 x 20 second holds Active hamstring stretch 5 x 10 second holds Sidelying clam GTB at knees 2 x 10  Standing hip flexor stretch at step 3 x 20 second holds Standing hip adductor stretch with over head reach 10 x 10 second holds   PATIENT EDUCATION:  Education details: Patient educated on exam findings, POC, scope of PT, HEP. 05/14/23: HEP Person educated: Patient Education method: Programmer, Multimedia, Demonstration, and Handouts Education comprehension: verbalized understanding, returned demonstration, verbal cues required, and tactile cues required  HOME EXERCISE PROGRAM: Access Code: [email or web address] URL: [email or web address]  Date: 04/30/2023 - Standing Hip Abduction with Counter Support  - 2 x daily - 7 x weekly - 2 sets - 10 reps - Standing Hip Extension with Counter Support  - 2 x daily - 7 x weekly - 2 sets - 10 reps  05/14/23 - Supine Bridge  - 1 x daily - 7 x weekly - 2 sets - 10 reps - Supine Lower Trunk Rotation  - 1 x daily - 7 x weekly - 10 reps - 5 second hold - Abdominal Bracing  - 1 x daily - 7 x weekly - 1 sets - 10 reps - 5 second hold - Supine March  - 1 x daily - 7 x weekly - 2 sets - 10 reps - Step Up  - 1 x daily - 7 x weekly - 2 sets - 10 reps - Side Stepping with Counter Support  - 1 x daily - 7 x weekly - 4 sets - 10 reps  05/17/23 - Standing Shoulder Row with Anchored Resistance  - 1 x daily - 7 x weekly - 2 sets - 10 reps - Shoulder extension with resistance - Neutral  - 1 x daily - 7 x weekly - 2 sets - 10 reps - Standing Anti-Rotation Press with Anchored Resistance  - 1 x daily - 7 x weekly - 2 sets - 10 reps  06/04/23 - Step Up  - 1 x daily - 7 x weekly - 3 sets - 15 reps - Lateral Step Up  - 1 x daily - 7 x weekly - 3 sets - 15 reps - Sit to Stand with Arms Crossed  - 1 x daily - 7 x weekly - 3 sets - 10  reps  ASSESSMENT:  CLINICAL IMPRESSION: Continued to work on correction of pelvic alignment and lateral glute strength.  Patient limited by left hip extension with gait.  Noted bilateral quad tightness with prolonged stretching.  Instructed patient to add this exercise or add quad stretch to [employer].  She was instructed in how to do so.  Patient did report improvement in  gait following quad stretching.  Patient with good grasp of pelvic tilting in hook lying position, however when trialed in standing against the ball she required significant cues for correct performance.  Patient will benefit from continued work on this with cues as needed.  Progressing well towards goals.   OBJECTIVE IMPAIRMENTS: Abnormal gait, decreased activity tolerance, decreased balance, decreased endurance, decreased mobility, difficulty walking, decreased ROM, decreased strength, increased muscle spasms, impaired flexibility, improper body mechanics, postural dysfunction, and pain.   ACTIVITY LIMITATIONS: carrying, lifting, bending, standing, squatting, transfers, hygiene/grooming, locomotion level, and caring for others  PARTICIPATION LIMITATIONS: meal prep, cleaning, laundry, shopping, community activity, and yard work  PERSONAL FACTORS: Fitness, Time since onset of injury/illness/exacerbation, and 3+ comorbidities: HTN, HLD, DM, hx back pain  are also affecting patient's functional outcome.   REHAB POTENTIAL: Good  CLINICAL DECISION MAKING: Stable/uncomplicated  EVALUATION COMPLEXITY: Low   GOALS: Goals reviewed with patient? Yes  SHORT TERM GOALS: Target date: 05/28/2023    Patient will be independent with HEP in order to improve functional outcomes. Baseline:  Goal status: MET (9/30)  2.  Patient will report at least 25% improvement in symptoms for improved quality of life. Baseline:  Goal status: MET  3. Patient will increase left hip ER to full without pain  Goal status: initial   4.  Patient will increase right quad strength by 10 lbs  Goal status: initial    LONG TERM GOALS: Target date: 06/25/2023    Patient will report at least 75% improvement in symptoms for improved quality of life. Baseline:  Goal status: IN PROGRESS  2.  Patient will improve FOTO score by at least 9 points in order to indicate improved tolerance to activity. Baseline: 53% function 06/18/23: 53% function 53% 12/09 Goal status: INITIAL  3.  Patient will demonstrate at least 25% improvement in lumbar ROM in all restricted planes for improved ability to move trunk while completing chores. Baseline:  Goal status: MET  4.  Patient will report improved confidence with gait, balance, and strength in roder to return to general exercise routine. Baseline:  Goal status: improving 12/9  5.  Patient will demonstrate 5lb increase in muscle testing in all tested musculature as evidence of improved strength to assist with stair ambulation and gait.   Baseline: see above Goal status: MET     PLAN:  PT FREQUENCY: 2x/week  PT DURATION: 8 weeks  PLANNED INTERVENTIONS: Therapeutic exercises, Therapeutic activity, Neuromuscular re-education, Balance training, Gait training, Patient/Family education, Joint manipulation, Joint mobilization, Stair training, Orthotic/Fit training, DME instructions, Aquatic Therapy, Dry Needling, Electrical stimulation, Spinal manipulation, Spinal mobilization, Cryotherapy, Moist heat, Compression bandaging, scar mobilization, Splintting, Taping, Traction, Ultrasound, Ionotophoresis 4mg /ml Dexamethasone , and Manual therapy  PLAN FOR NEXT SESSION: test balance, hip strength, functional strength, lumbar mobility, postural and core strength    Asberry BRAVO Asiel Chrostowski, PTA 09/10/2023, 5:08 PM    Referring diagnosis? M54.42 (ICD-10-CM) - Left-sided low back pain with left-sided sciatica, unspecified chronicity Treatment diagnosis? (if different than referring diagnosis) M54.59 What  was this (referring dx) caused by? []  Surgery [x]  Fall [x]  Ongoing issue []  Arthritis []  Other: ____________  Laterality: []  Rt []  Lt [x]  Both  Check all possible CPT codes:  *CHOOSE 10 OR LESS*    []  97110 (Therapeutic Exercise)  []  92507 (SLP Treatment)  []  02887 (Neuro Re-ed)   []  07473 (Swallowing Treatment)   a 02883 (Gait Training)   []  [id number] (Cognitive Training, 1st 15 minutes) []  97140 (Manual Therapy)   []   02869 (Cognitive Training, each add'l 15 minutes)  []  [id number] (Re-evaluation)                              []  Other, List CPT Code ____________  []  97530 (Therapeutic Activities)     []  97535 (Self Care)   [x]  All codes above (97110 - 97535)  []  97012 (Mechanical Traction)  []  97014 (E-stim Unattended)  []  97032 (E-stim manual)  []  97033 (Ionto)  []  97035 (Ultrasound) []  97750 (Physical Performance Training) []  [id number] (Aquatic Therapy) []  97016 (Vasopneumatic Device) []  [id number] (Paraffin) []  97034 (Contrast Bath) []  97597 (Wound Care 1st 20 sq cm) []  02401 (Wound Care each add'l 20 sq cm) []  97760 (Orthotic Fabrication, Fitting, Training Initial) []  [id number] (Prosthetic Management and Training Initial) []  [id number] (Orthotic or Prosthetic Training/ Modification Subsequent)

## 2023-09-16 ENCOUNTER — Ambulatory Visit (HOSPITAL_BASED_OUTPATIENT_CLINIC_OR_DEPARTMENT_OTHER): Payer: Medicare PPO | Admitting: Physical Therapy

## 2023-09-16 ENCOUNTER — Encounter (HOSPITAL_BASED_OUTPATIENT_CLINIC_OR_DEPARTMENT_OTHER): Payer: Self-pay | Admitting: Physical Therapy

## 2023-09-16 DIAGNOSIS — M6281 Muscle weakness (generalized): Secondary | ICD-10-CM

## 2023-09-16 DIAGNOSIS — R29898 Other symptoms and signs involving the musculoskeletal system: Secondary | ICD-10-CM | POA: Diagnosis not present

## 2023-09-16 DIAGNOSIS — R2689 Other abnormalities of gait and mobility: Secondary | ICD-10-CM | POA: Diagnosis not present

## 2023-09-16 DIAGNOSIS — M5459 Other low back pain: Secondary | ICD-10-CM | POA: Diagnosis not present

## 2023-09-16 NOTE — Therapy (Signed)
 OUTPATIENT PHYSICAL THERAPY TREATMENT   Patient Name: Joanne Mendoza MRN: [id number] DOB:12/01/1945, 78 y.o., female Today's Date: 09/16/2023    END OF SESSION:  PT End of Session - 09/16/23 1517     Visit Number 19    Number of Visits 32    Date for PT Re-Evaluation 10/06/23    Authorization Type [employer]    Authorization Time Period 20 visits approved  From 12.12.2024 - 02.03.2025    Authorization - Visit Number 6    Authorization - Number of Visits 20    Progress Note Due on Visit 23    PT Start Time 1516    PT Stop Time 1555    PT Time Calculation (min) 39 min    Activity Tolerance Patient tolerated treatment well    Behavior During Therapy WFL for tasks assessed/performed                Past Medical History:  Diagnosis Date   Hyperlipidemia    Hypertension    Past Surgical History:  Procedure Laterality Date   broken arm     CESAREAN SECTION     x 2    Patient Active Problem List   Diagnosis Date Noted   Hyperlipidemia associated with type 2 diabetes mellitus (HCC) 10/02/2018   White coat hypertension 09/07/2013   Severe obesity (BMI >= 40) (HCC) 05/21/2013   Type 2 diabetes mellitus with hyperglycemia (HCC) 05/20/2013   Essential hypertension 12/12/2008   NONSPECIFIC ABN FINDING RAD & OTH EXAM GU ORGAN 12/12/2008    PCP: Micheal Wolm ORN, MD  REFERRING PROVIDER: Micheal Wolm ORN, MD  REFERRING DIAG: [phone] (ICD-10-CM) - Left-sided low back pain with left-sided sciatica, unspecified chronicity  Rationale for Evaluation and Treatment: Rehabilitation  THERAPY DIAG:  Other low back pain  Other symptoms and signs involving the musculoskeletal system  Other abnormalities of gait and mobility  Muscle weakness (generalized)  ONSET DATE: chronic  SUBJECTIVE:                                                                                                                                                                                            SUBJECTIVE STATEMENT: Pt reports she overdid it yesterday taking down christmas stuff. Hip is still bothering her. HEP going well, didn't do it yesterday though.   EVAL: Patient states used to be more mobile but has decreased over last few years. Had a fall earlier this year when walking [place]. Some swelling and numbness in R knee but has been getting better. Some left leg issues as well. Patient states some back aches  and pains. Sciatic symptoms L hip/low back to L knee. Is afraid of falling so she limits activity which makes it worse, caught up in cycle.   PERTINENT HISTORY:  HTN, HLD, DM  PAIN:  Are you having pain? None at rest, 5-8/10 with activity.   PRECAUTIONS: None  WEIGHT BEARING RESTRICTIONS: No  FALLS:  Has patient fallen in last 6 months? Yes. Number of falls 1   PLOF: Independent  PATIENT GOALS: get more active again and feel better    OBJECTIVE: (objective measures from initial evaluation unless otherwise dated)  PATIENT SURVEYS:  FOTO 53% function 06/18/23: 53% function 12/9:54%  SCREENING FOR RED FLAGS: Bowel or bladder incontinence: No Spinal tumors: No Cauda equina syndrome: No Compression fracture: No Abdominal aneurysm: No  COGNITION: Overall cognitive status: Within functional limits for tasks assessed     SENSATION: WFL   POSTURE: rounded shoulders, forward head, decreased lumbar lordosis, anterior pelvic tilt, and flexed trunk   PALPATION: Mild ttp lumbar paraspinals, L glutes  LUMBAR ROM:   AROM eval 12/9   Flexion 0% limited 0% limited   Extension 75% limited 50% limited 0% No pain   Right lateral flexion 25% limited 0% limited   Left lateral flexion 25% limited 0% limited   Right rotation     Left rotation      (Blank rows = not tested) * = pain/symptoms  LOWER EXTREMITY ROM:     Passive  Right eval Left eval  Hip flexion    Hip extension    Hip abduction    Hip adduction    Hip internal  rotation    Hip external rotation    Knee flexion    Knee extension    Ankle dorsiflexion    Ankle plantarflexion    Ankle inversion    Ankle eversion     (Blank rows = not tested) * = pain/symptoms  LOWER EXTREMITY MMT:    MMT Right eval Left eval Right 05/14/23 Left  05/14/23 Right 10/16 Left 06/18/23 Right 12/09 Left 12/09  Hip flexion 40.5 39.1   55.4 54.1 39.9 37.1  Hip extension   4/5 4/5 4/5 4/5    Hip abduction 43.0 41.4 4-/5 4/5 72.9/ 4-/5 60.5/ 4-/5 43.7 36.5  Hip adduction          Hip internal rotation          Hip external rotation          Knee flexion          Knee extension 73.3 66.9   82 68.3 26.7 43.3  Ankle dorsiflexion          Ankle plantarflexion          Ankle inversion          Ankle eversion           (Blank rows = not tested) * = pain/symptoms   FUNCTIONAL TESTS:  5 times sit to stand: 9.53 seconds without UE support, mild unsteadiness no loss of balance   12/9 9 seconds      GAIT: Distance walked: 80 feet Assistive device utilized: None Level of assistance: Complete Independence Comments: forward trunk, decreased foot clearance, bilateral glute weakness  TODAY'S TREATMENT:  DATE:  09/16/23 LTR x20 PPT with ab set 5 3x10 Bridge 2 x 10 Seated PPT 2 x 10 with 5 second holds  Standing PPT against wall 3 x 10 3-5 second holds Standing PPT 2 x 10 Hip hinge 2 x 10  Standing hip extension with PPT and glute set 1 x 15 bilateral  09/10/23 LTR x20 PPT with ab set 5 2x10 Sidelying clam 3 hold 2x15ea Hip hike off 4 step 3x20bil PPT against wall 3 seconds x 10 Gait in hall x2 laps Prone quad stretch with manual resistance bilaterally Prone hip extension x 10 bilaterally Instruction in [place] stretch with additional quad stretch.   09/04/23 Sidelying clam BTB at knees 3x10 Standing hip flexor stretch 5 x 20  second holds Hip hikes 4 inch step 3 x 20 bilateral PPT with ab set 3 x 10   12/30 STM and IASTM to R quads and hip flexors, R glutes LTR 5 x10ea Thomas stretching Sidelying clam GTB at knees 2x15  Standing hip flexion stretch (sliding LE posteriorly) Standing hip extension 2x10ea Hip hikes 4 step 3x20L -gait in hall   08/20/23 LTR 5 x10ea Piriformis stretch 3 x 20 second holds Active hamstring stretch 5 x 10 second holds Sidelying clam GTB at knees 2 x 10  Standing hip flexor stretch at step 3 x 20 second holds Standing hip adductor stretch with over head reach 10 x 10 second holds   PATIENT EDUCATION:  Education details: Patient educated on exam findings, POC, scope of PT, HEP. 05/14/23: HEP Person educated: Patient Education method: Programmer, Multimedia, Demonstration, and Handouts Education comprehension: verbalized understanding, returned demonstration, verbal cues required, and tactile cues required  HOME EXERCISE PROGRAM: Access Code: [email or web address] URL: [email or web address]  Date: 04/30/2023 - Standing Hip Abduction with Counter Support  - 2 x daily - 7 x weekly - 2 sets - 10 reps - Standing Hip Extension with Counter Support  - 2 x daily - 7 x weekly - 2 sets - 10 reps  05/14/23 - Supine Bridge  - 1 x daily - 7 x weekly - 2 sets - 10 reps - Supine Lower Trunk Rotation  - 1 x daily - 7 x weekly - 10 reps - 5 second hold - Abdominal Bracing  - 1 x daily - 7 x weekly - 1 sets - 10 reps - 5 second hold - Supine March  - 1 x daily - 7 x weekly - 2 sets - 10 reps - Step Up  - 1 x daily - 7 x weekly - 2 sets - 10 reps - Side Stepping with Counter Support  - 1 x daily - 7 x weekly - 4 sets - 10 reps  05/17/23 - Standing Shoulder Row with Anchored Resistance  - 1 x daily - 7 x weekly - 2 sets - 10 reps - Shoulder extension with resistance - Neutral  - 1 x daily - 7 x weekly - 2 sets - 10 reps - Standing Anti-Rotation Press with Anchored Resistance  - 1 x daily - 7 x  weekly - 2 sets - 10 reps  06/04/23 - Step Up  - 1 x daily - 7 x weekly - 3 sets - 15 reps - Lateral Step Up  - 1 x daily - 7 x weekly - 3 sets - 15 reps - Sit to Stand with Arms Crossed  - 1 x daily - 7 x weekly - 3 sets - 10 reps  ASSESSMENT:  CLINICAL IMPRESSION: Continued with  core and hip strengthening which is tolerated well. Improving core activation in different positions. Educated on continuing to perform pelvic tilts with TRA activation throughout day for pelvic alignment. Patient will continue to benefit from physical therapy in order to improve function and reduce impairment.   OBJECTIVE IMPAIRMENTS: Abnormal gait, decreased activity tolerance, decreased balance, decreased endurance, decreased mobility, difficulty walking, decreased ROM, decreased strength, increased muscle spasms, impaired flexibility, improper body mechanics, postural dysfunction, and pain.   ACTIVITY LIMITATIONS: carrying, lifting, bending, standing, squatting, transfers, hygiene/grooming, locomotion level, and caring for others  PARTICIPATION LIMITATIONS: meal prep, cleaning, laundry, shopping, community activity, and yard work  PERSONAL FACTORS: Fitness, Time since onset of injury/illness/exacerbation, and 3+ comorbidities: HTN, HLD, DM, hx back pain  are also affecting patient's functional outcome.   REHAB POTENTIAL: Good  CLINICAL DECISION MAKING: Stable/uncomplicated  EVALUATION COMPLEXITY: Low   GOALS: Goals reviewed with patient? Yes  SHORT TERM GOALS: Target date: 05/28/2023    Patient will be independent with HEP in order to improve functional outcomes. Baseline:  Goal status: MET (9/30)  2.  Patient will report at least 25% improvement in symptoms for improved quality of life. Baseline:  Goal status: MET  3. Patient will increase left hip ER to full without pain  Goal status: initial   4. Patient will increase right quad strength by 10 lbs  Goal status: initial    LONG TERM GOALS:  Target date: 06/25/2023    Patient will report at least 75% improvement in symptoms for improved quality of life. Baseline:  Goal status: IN PROGRESS  2.  Patient will improve FOTO score by at least 9 points in order to indicate improved tolerance to activity. Baseline: 53% function 06/18/23: 53% function 53% 12/09 Goal status: INITIAL  3.  Patient will demonstrate at least 25% improvement in lumbar ROM in all restricted planes for improved ability to move trunk while completing chores. Baseline:  Goal status: MET  4.  Patient will report improved confidence with gait, balance, and strength in roder to return to general exercise routine. Baseline:  Goal status: improving 12/9  5.  Patient will demonstrate 5lb increase in muscle testing in all tested musculature as evidence of improved strength to assist with stair ambulation and gait.   Baseline: see above Goal status: MET     PLAN:  PT FREQUENCY: 2x/week  PT DURATION: 8 weeks  PLANNED INTERVENTIONS: Therapeutic exercises, Therapeutic activity, Neuromuscular re-education, Balance training, Gait training, Patient/Family education, Joint manipulation, Joint mobilization, Stair training, Orthotic/Fit training, DME instructions, Aquatic Therapy, Dry Needling, Electrical stimulation, Spinal manipulation, Spinal mobilization, Cryotherapy, Moist heat, Compression bandaging, scar mobilization, Splintting, Taping, Traction, Ultrasound, Ionotophoresis 4mg /ml Dexamethasone , and Manual therapy  PLAN FOR NEXT SESSION: test balance, hip strength, functional strength, lumbar mobility, postural and core strength    Prentice GORMAN Stains, PT 09/16/2023, 3:17 PM    Referring diagnosis? M54.42 (ICD-10-CM) - Left-sided low back pain with left-sided sciatica, unspecified chronicity Treatment diagnosis? (if different than referring diagnosis) M54.59 What was this (referring dx) caused by? []  Surgery [x]  Fall [x]  Ongoing issue []  Arthritis []   Other: ____________  Laterality: []  Rt []  Lt [x]  Both  Check all possible CPT codes:  *CHOOSE 10 OR LESS*    []  02889 (Therapeutic Exercise)  []  92507 (SLP Treatment)  []  97112 (Neuro Re-ed)   []  92526 (Swallowing Treatment)   a 02883 (Gait Training)   []  [id number] (Cognitive Training, 1st 15 minutes) []  97140 (Manual Therapy)   []  97130 (Cognitive Training,  each add'l 15 minutes)  []  97164 (Re-evaluation)                              []  Other, List CPT Code ____________  []  97530 (Therapeutic Activities)     []  97535 (Self Care)   [x]  All codes above (97110 - 97535)  []  97012 (Mechanical Traction)  []  97014 (E-stim Unattended)  []  97032 (E-stim manual)  []  97033 (Ionto)  []  97035 (Ultrasound) []  97750 (Physical Performance Training) []  [id number] (Aquatic Therapy) []  97016 (Vasopneumatic Device) []  [id number] (Paraffin) []  97034 (Contrast Bath) []  97597 (Wound Care 1st 20 sq cm) []  97598 (Wound Care each add'l 20 sq cm) []  97760 (Orthotic Fabrication, Fitting, Training Initial) []  [id number] (Prosthetic Management and Training Initial) []  [id number] (Orthotic or Prosthetic Training/ Modification Subsequent)

## 2023-09-23 ENCOUNTER — Ambulatory Visit (INDEPENDENT_AMBULATORY_CARE_PROVIDER_SITE_OTHER): Payer: Medicare PPO | Admitting: Family Medicine

## 2023-09-23 ENCOUNTER — Encounter: Payer: Medicare PPO | Admitting: Family Medicine

## 2023-09-23 ENCOUNTER — Ambulatory Visit (HOSPITAL_BASED_OUTPATIENT_CLINIC_OR_DEPARTMENT_OTHER): Payer: Medicare PPO

## 2023-09-23 ENCOUNTER — Encounter (HOSPITAL_BASED_OUTPATIENT_CLINIC_OR_DEPARTMENT_OTHER): Payer: Self-pay

## 2023-09-23 DIAGNOSIS — Z0000 Encounter for general adult medical examination without abnormal findings: Secondary | ICD-10-CM | POA: Diagnosis not present

## 2023-09-23 DIAGNOSIS — M6281 Muscle weakness (generalized): Secondary | ICD-10-CM | POA: Diagnosis not present

## 2023-09-23 DIAGNOSIS — M5459 Other low back pain: Secondary | ICD-10-CM

## 2023-09-23 DIAGNOSIS — R29898 Other symptoms and signs involving the musculoskeletal system: Secondary | ICD-10-CM | POA: Diagnosis not present

## 2023-09-23 DIAGNOSIS — R2689 Other abnormalities of gait and mobility: Secondary | ICD-10-CM | POA: Diagnosis not present

## 2023-09-23 NOTE — Progress Notes (Deleted)
PATIENT CHECK-IN and HEALTH RISK ASSESSMENT QUESTIONNAIRE:  -completed by phone/video for upcoming Medicare Preventive Visit  -PLEASE SELECT "NOT IN PERSON" for the method of visit.   FIRST check to see if the patient completed the online questionnaire - if so, this can be found under the "rooming tab", then go to the "questionnaires" tab. Some of the questions are the same and you can use the answers to complete all of the bold questions below before calling the patient. Question #s below: 6 (fall risk screening), under habits # 1, 2, 3, 8 and 9,  under everyday activities #2, 3 and 8.   Pre-Visit Check-in: 1)Vitals (height, wt, BP, etc) - record in vitals section for visit on day of visit Request home vitals (wt, BP, etc.) and enter into vitals, THEN update Vital Signs SmartPhrase below at the top of the HPI. See below.  2)Review and Update Medications, Allergies PMH, Surgeries, Social history in Epic 3)Hospitalizations in the last year with date/reason? ***  4)Review and Update Care Team (patient's specialists) in Epic 5) Complete PHQ9 in Epic  6) Complete Fall Screening in Epic 7)Review all Health Maintenance Due and order under PCP if not done.  Medicare Wellness Patient Questionnaire:  Answer theses question about your habits: How often do you have a drink containing alcohol?*** How many drinks containing alcohol do you have on a typical day when you are drinking?*** How often do you have six or more drinks on one occasion?*** Have you ever smoked?*** Quit date if applicable? ***  How many packs a day do/did you smoke? *** Do you use smokeless tobacco?*** Do you use an illicit drugs?*** On average, how many days per week do you engage in moderate to strenuous exercise (like a brisk walk)?*** On average, how many minutes do you engage in exercise at this level?*** Are you sexually active? ***Number of partners?*** Typical breakfast**** Typical lunch*** Typical dinner*** Typical  snacks:****  Beverages: ***  Answer theses question about your everyday activities: Can you perform most household chores?*** Are you deaf or have significant trouble hearing?*** Do you feel that you have a problem with memory?*** Do you feel safe at home?*** Last dentist visit?*** 8. Do you have any difficulty performing your everyday activities?*** Are you having any difficulty walking, taking medications on your own, and or difficulty managing daily home needs?*** Do you have difficulty walking or climbing stairs?*** Do you have difficulty dressing or bathing?*** Do you have difficulty doing errands alone such as visiting a doctor's office or shopping?*** Do you currently have any difficulty preparing food and eating?*** Do you currently have any difficulty using the toilet?*** Do you have any difficulty managing your finances?*** Do you have any difficulties with housekeeping of managing your housekeeping?***   Do you have Advanced Directives in place (Living Will, Healthcare Power or Attorney)? ***   Last eye Exam and location?***   Do you currently use prescribed or non-prescribed narcotic or opioid pain medications?***  Do you have a history or close family history of breast, ovarian, tubal or peritoneal cancer or a family member with BRCA (breast cancer susceptibility 1 and 2) gene mutations?  ***Request home vitals (wt, BP, etc.) and enter into vitals, THEN update Vital Signs SmartPhrase below at the top of the HPI. See below.   Nurse/Assistant Credentials/time stamp:    ----------------------------------------------------------------------------------------------------------------------------------------------------------------------------------------------------------------------  Because this visit was a virtual/telehealth visit, some criteria may be missing or patient reported. Any vitals not documented were not able to be obtained and  vitals that have been  documented are patient reported.    MEDICARE ANNUAL PREVENTIVE VISIT WITH PROVIDER: (Welcome to Medicare, initial annual wellness or annual wellness exam)  Virtual Visit via Video***Phone Note  I connected with KALITA NASUTI on 09/23/23 by phone *** a video enabled telemedicine application and verified that I am speaking with the correct person using two identifiers.  Location patient: home Location provider:work or home office Persons participating in the virtual visit: patient, provider  Concerns and/or follow up today:   See HM section in Epic for other details of completed HM.    ROS: negative for report of fevers, unintentional weight loss, vision changes, vision loss, hearing loss or change, chest pain, sob, hemoptysis, melena, hematochezia, hematuria, falls, bleeding or bruising, thoughts of suicide or self harm, memory loss  Patient-completed extensive health risk assessment - reviewed and discussed with the patient: See Health Risk Assessment completed with patient prior to the visit either above or in recent phone note. This was reviewed in detailed with the patient today and appropriate recommendations, orders and referrals were placed as needed per Summary below and patient instructions.   Review of Medical History: -PMH, PSH, Family History and current specialty and care providers reviewed and updated and listed below   Patient Care Team: Kristian Covey, MD as PCP - General   Past Medical History:  Diagnosis Date   Hyperlipidemia    Hypertension     Past Surgical History:  Procedure Laterality Date   broken arm     CESAREAN SECTION     x 2     Social History   Socioeconomic History   Marital status: Divorced    Spouse name: Not on file   Number of children: Not on file   Years of education: Not on file   Highest education level: Not on file  Occupational History   Occupation: retired  Tobacco Use   Smoking status: Never   Smokeless tobacco:  Never  Vaping Use   Vaping status: Never Used  Substance and Sexual Activity   Alcohol use: No    Comment: 4 times a year wine   Drug use: No   Sexual activity: Not on file  Other Topics Concern   Not on file  Social History Narrative   Not on file   Social Drivers of Health   Financial Resource Strain: Low Risk  (07/10/2021)   Overall Financial Resource Strain (CARDIA)    Difficulty of Paying Living Expenses: Not hard at all  Food Insecurity: No Food Insecurity (07/10/2021)   Hunger Vital Sign    Worried About Running Out of Food in the Last Year: Never true    Ran Out of Food in the Last Year: Never true  Transportation Needs: No Transportation Needs (07/10/2021)   PRAPARE - Administrator, Civil Service (Medical): No    Lack of Transportation (Non-Medical): No  Physical Activity: Inactive (07/10/2021)   Exercise Vital Sign    Days of Exercise per Week: 0 days    Minutes of Exercise per Session: 0 min  Stress: No Stress Concern Present (07/10/2021)   [employer] of Occupational Health - Occupational Stress Questionnaire    Feeling of Stress : Not at all  Social Connections: Moderately Integrated (06/14/2020)   Social Connection and Isolation Panel [NHANES]    Frequency of Communication with Friends and Family: More than three times a week    Frequency of Social Gatherings with Friends and Family: More  than three times a week    Attends Religious Services: More than 4 times per year    Active Member of Clubs or Organizations: Yes    Attends Banker Meetings: 1 to 4 times per year    Marital Status: Divorced  Intimate Partner Violence: Not At Risk (06/14/2020)   Humiliation, Afraid, Rape, and Kick questionnaire    Fear of Current or Ex-Partner: No    Emotionally Abused: No    Physically Abused: No    Sexually Abused: No    Family History  Problem Relation Age of Onset   Alcohol abuse Father    Hyperlipidemia Other        Grandparent     Hypertension Mother    Colon cancer Paternal Uncle    Pancreatic cancer Paternal Uncle     Current Outpatient Medications on File Prior to Visit  Medication Sig Dispense Refill   amLODipine (NORVASC) 5 MG tablet Take 1 tablet (5 mg total) by mouth daily. 90 tablet 1   atorvastatin (LIPITOR) 40 MG tablet Take 1 tablet (40 mg total) by mouth daily. 90 tablet 1   losartan-hydrochlorothiazide (HYZAAR) 100-12.5 MG tablet Take 1 tablet by mouth daily. 90 tablet 3   metFORMIN (GLUCOPHAGE) 500 MG tablet TAKE 2 TABLETS BY MOUTH TWICE DAILY 360 tablet 1   metoprolol succinate (TOPROL-XL) 50 MG 24 hr tablet Take 1 tablet (50 mg total) by mouth daily. Take with or immediately following a meal.TAKE 1 TABLET BY MOUTH EVERY DAY WITH OR IMMEDIATELY FOLLOWING A MEAL 90 tablet 1   PREVIDENT 5000 PLUS 1.1 % CREA dental cream Take by mouth.     No current facility-administered medications on file prior to visit.    Allergies  Allergen Reactions   Penicillins     REACTION: hives       Physical Exam Vitals requested from patient and listed below if patient had equipment and was able to obtain at home for this virtual visit: There were no vitals filed for this visit. Estimated body mass index is 33.64 kg/m as calculated from the following:   Height as of 03/31/23: 5' 5.5" (1.664 m).   Weight as of 03/31/23: 205 lb 4.8 oz (93.1 kg).  EKG (optional): deferred due to virtual visit  GENERAL: alert, oriented, no acute distress detected, full vision exam deferred due to pandemic and/or virtual encounter  *** HEENT: atraumatic, conjunttiva clear, no obvious abnormalities on inspection of external nose and ears  NECK: normal movements of the head and neck  LUNGS: on inspection no signs of respiratory distress, breathing rate appears normal, no obvious gross SOB, gasping or wheezing  CV: no obvious cyanosis  MS: moves all visible extremities without noticeable abnormality  PSYCH/NEURO: pleasant and  cooperative, no obvious depression or anxiety, speech and thought processing grossly intact, Cognitive function grossly intact  Flowsheet Row Video Visit from 09/17/2022 in [facility]  PHQ-9 Total Score 0           09/17/2022   12:33 PM 06/07/2022    2:19 PM 07/10/2021    1:33 PM 06/14/2020    1:13 PM 04/21/2020    1:17 PM  Depression screen PHQ 2/9  Decreased Interest 0 0 0 0 0  Down, Depressed, Hopeless 0 0 0 0 0  PHQ - 2 Score 0 0 0 0 0  Altered sleeping 0      Tired, decreased energy 0      Change in appetite 0  Feeling bad or failure about yourself  0      Trouble concentrating 0      Moving slowly or fidgety/restless 0      Suicidal thoughts 0      PHQ-9 Score 0      Difficult doing work/chores Not difficult at all           10/08/2017    2:50 PM 04/21/2020    1:17 PM 06/14/2020    1:17 PM 07/10/2021    1:33 PM 06/07/2022    2:19 PM  Fall Risk  Falls in the past year? No 0 1 0 1  Was there an injury with Fall?  0 1  0  Was there an injury with Fall? - Comments   bruise knee and scratch hands    Fall Risk Category Calculator  0 3  1  Fall Risk Category (Retired)  Low High  Low  (RETIRED) Patient Fall Risk Level  Low fall risk  Low fall risk Low fall risk  Patient at Risk for Falls Due to    Medication side effect No Fall Risks  Fall risk Follow up  Falls evaluation completed  Falls evaluation completed;Education provided;Falls prevention discussed Falls evaluation completed     SUMMARY AND PLAN:  No diagnosis found.  Visit coding *** [phone] (annual wellness visit -initial); G0439 (annual wellness subsequent); G0402 Welcome to Medicare(initial preventive physical exam)   Discussed applicable health maintenance/preventive health measures and advised and referred or ordered per patient preferences:  Health Maintenance  Topic Date Due   Hepatitis C Screening  Never done   Colonoscopy  Never done   Zoster Vaccines- Shingrix (1  of 2) 09/24/1997   DEXA SCAN  Never done   FOOT EXAM  08/19/2020   DTaP/Tdap/Td (2 - Td or Tdap) 03/23/2023   INFLUENZA VACCINE  04/03/2023   COVID-19 Vaccine (7 - 2024-25 season) 05/04/2023   Medicare Annual Wellness (AWV)  09/18/2023   Diabetic kidney evaluation - eGFR measurement  09/28/2023   Diabetic kidney evaluation - Urine ACR  09/28/2023   HEMOGLOBIN A1C  10/01/2023   OPHTHALMOLOGY EXAM  05/26/2024   Pneumonia Vaccine 56+ Years old  Completed   HPV VACCINES  Aged [employer] and counseling on the following was provided based on the above review of health and a plan/checklist for the patient, along with additional information discussed, was provided for the patient in the patient instructions :  -Advised on importance of completing advanced directives, discussed options for completing and provided information in patient instructions as well -Provided counseling and plan for difficulty hearing  -Provided counseling and plan for increased risk of falling if applicable per above screening. Reviewed and demonstrated safe balance exercises that can be done at home to improve balance and discussed exercise guidelines for adults with include balance exercises at least 3 days per week.  -Advised and counseled on a healthy lifestyle - including the importance of a healthy diet, regular physical activity, social connections and stress management. -Reviewed patient's current diet. Advised and counseled on a whole foods based healthy diet. A summary of a healthy diet was provided in the Patient Instructions.  -reviewed patient's current physical activity level and discussed exercise guidelines for adults. Discussed community resources and ideas for safe exercise at home to assist in meeting exercise guideline recommendations in a safe and healthy way.  -Advise yearly dental visits at minimum and regular eye exams -Advised and counseled on alcohol safe  limits, risks/ tobacco use, risks of  smoking and offered counseling/help, drug, opoid use/misuse   Follow up: see patient instructions     There are no Patient Instructions on file for this visit.  Terressa Koyanagi, DO

## 2023-09-23 NOTE — Therapy (Signed)
OUTPATIENT PHYSICAL THERAPY TREATMENT   Patient Name: Joanne Mendoza MRN: [id number] DOB:May 26, 1946, 78 y.o., female Today's Date: 09/23/2023    END OF SESSION:  PT End of Session - 09/23/23 1359     Visit Number 20    Number of Visits 32    Date for PT Re-Evaluation 10/06/23    Authorization Type [employer]    Authorization Time Period 20 visits approved  From 12.12.2024 - 02.03.2025    Authorization - Visit Number 7    Authorization - Number of Visits 20    Progress Note Due on Visit 23    PT Start Time 1350    PT Stop Time 1430    PT Time Calculation (min) 40 min    Activity Tolerance Patient tolerated treatment well    Behavior During Therapy [facility] for tasks assessed/performed                 Past Medical History:  Diagnosis Date   Hyperlipidemia    Hypertension    Past Surgical History:  Procedure Laterality Date   broken arm     CESAREAN SECTION     x 2    Patient Active Problem List   Diagnosis Date Noted   Hyperlipidemia associated with type 2 diabetes mellitus (HCC) 10/02/2018   White coat hypertension 09/07/2013   Severe obesity (BMI >= 40) (HCC) 05/21/2013   Type 2 diabetes mellitus with hyperglycemia (HCC) 05/20/2013   Essential hypertension 12/12/2008   NONSPECIFIC ABN FINDING RAD & OTH EXAM GU ORGAN 12/12/2008    PCP: Kristian Covey, MD  REFERRING PROVIDER: Kristian Covey, MD  REFERRING DIAG: [phone] (ICD-10-CM) - Left-sided low back pain with left-sided sciatica, unspecified chronicity  Rationale for Evaluation and Treatment: Rehabilitation  THERAPY DIAG:  Other low back pain  Other abnormalities of gait and mobility  Other symptoms and signs involving the musculoskeletal system  Muscle weakness (generalized)  ONSET DATE: chronic  SUBJECTIVE:                                                                                                                                                                                            SUBJECTIVE STATEMENT: Pt reports she overdid it yesterday taking down christmas stuff. Hip is still bothering her. HEP going well, didn't do it yesterday though.   EVAL: Patient states used to be more mobile but has decreased over last few years. Had a fall earlier this year when walking [place]. Some swelling and numbness in R knee but has been getting better. Some left leg issues as well. Patient states some back  aches and pains. Sciatic symptoms L hip/low back to L knee. Is afraid of falling so she limits activity which makes it worse, caught up in cycle.   PERTINENT HISTORY:  HTN, HLD, DM  PAIN:  Are you having pain? None at rest, 5-8/10 with activity.   PRECAUTIONS: None  WEIGHT BEARING RESTRICTIONS: No  FALLS:  Has patient fallen in last 6 months? Yes. Number of falls 1   PLOF: Independent  PATIENT GOALS: get more active again and feel better    OBJECTIVE: (objective measures from initial evaluation unless otherwise dated)  PATIENT SURVEYS:  FOTO 53% function 06/18/23: 53% function 12/9:54%  SCREENING FOR RED FLAGS: Bowel or bladder incontinence: No Spinal tumors: No Cauda equina syndrome: No Compression fracture: No Abdominal aneurysm: No  COGNITION: Overall cognitive status: Within functional limits for tasks assessed     SENSATION: WFL   POSTURE: rounded shoulders, forward head, decreased lumbar lordosis, anterior pelvic tilt, and flexed trunk   PALPATION: Mild ttp lumbar paraspinals, L glutes  LUMBAR ROM:   AROM eval 12/9   Flexion 0% limited 0% limited   Extension 75% limited 50% limited 0% No pain   Right lateral flexion 25% limited 0% limited   Left lateral flexion 25% limited 0% limited   Right rotation     Left rotation      (Blank rows = not tested) * = pain/symptoms  LOWER EXTREMITY ROM:     Passive  Right eval Left eval  Hip flexion    Hip extension    Hip abduction    Hip adduction    Hip internal  rotation    Hip external rotation    Knee flexion    Knee extension    Ankle dorsiflexion    Ankle plantarflexion    Ankle inversion    Ankle eversion     (Blank rows = not tested) * = pain/symptoms  LOWER EXTREMITY MMT:    MMT Right eval Left eval Right 05/14/23 Left  05/14/23 Right 10/16 Left 06/18/23 Right 12/09 Left 12/09  Hip flexion 40.5 39.1   55.4 54.1 39.9 37.1  Hip extension   4/5 4/5 4/5 4/5    Hip abduction 43.0 41.4 4-/5 4/5 72.9/ 4-/5 60.5/ 4-/5 43.7 36.5  Hip adduction          Hip internal rotation          Hip external rotation          Knee flexion          Knee extension 73.3 66.9   82 68.3 26.7 43.3  Ankle dorsiflexion          Ankle plantarflexion          Ankle inversion          Ankle eversion           (Blank rows = not tested) * = pain/symptoms   FUNCTIONAL TESTS:  5 times sit to stand: 9.53 seconds without UE support, mild unsteadiness no loss of balance   12/9 9 seconds      GAIT: Distance walked: 80 feet Assistive device utilized: None Level of assistance: Complete Independence Comments: forward trunk, decreased foot clearance, bilateral glute weakness  TODAY'S TREATMENT:  DATE:   09/23/23 LTR x20 PPT with ab set 5" 3x10 Bridge 2 x 10 Prone quad stretch 2x30sec ea  Standing PPT against wall 2 x 10  Standing hip abduction 2x10bil Hip hinge 2 x 10  Standing hip extension with PPT and glute set 2x10bil Standing hip flexor stretch at bottom stair  Gait in hall HS and ITB stretching supine bil  09/16/23 LTR x20 PPT with ab set 5" 3x10 Bridge 2 x 10 Seated PPT 2 x 10 with 5 second holds  Standing PPT against wall 3 x 10 3-5 second holds Standing PPT 2 x 10 Hip hinge 2 x 10  Standing hip extension with PPT and glute set 1 x 15 bilateral  09/10/23 LTR x20 PPT with ab set 5" 2x10 Sidelying clam 3" hold  2x15ea Hip hike off 4" step 3x20bil PPT against wall 3 seconds x 10 Gait in hall x2 laps Prone quad stretch with manual resistance bilaterally Prone hip extension x 10 bilaterally Instruction in [place] stretch with additional quad stretch.   09/04/23 Sidelying clam BTB at knees 3x10 Standing hip flexor stretch 5 x 20 second holds Hip hikes 4 inch step 3 x 20 bilateral PPT with ab set 3 x 10   12/30 STM and IASTM to R quads and hip flexors, R glutes LTR 5" x10ea Thomas stretching Sidelying clam GTB at knees 2x15  Standing hip flexion stretch (sliding LE posteriorly) Standing hip extension 2x10ea Hip hikes 4" step 3x20L -gait in hall   08/20/23 LTR 5" x10ea Piriformis stretch 3 x 20 second holds Active hamstring stretch 5 x 10 second holds Sidelying clam GTB at knees 2 x 10  Standing hip flexor stretch at step 3 x 20 second holds Standing hip adductor stretch with over head reach 10 x 10 second holds   PATIENT EDUCATION:  Education details: Patient educated on exam findings, POC, scope of PT, HEP. 05/14/23: HEP Person educated: Patient Education method: Programmer, multimedia, Demonstration, and Handouts Education comprehension: verbalized understanding, returned demonstration, verbal cues required, and tactile cues required  HOME EXERCISE PROGRAM: Access Code: [email or web address] URL: [email or web address]   ASSESSMENT:  CLINICAL IMPRESSION: Improved performance with standing PPT with verbal cuing. Improved trunk control with gait, though she does drop R hip still. Some L hip pain with ambulation today. Notable L hip tightness with stretching compared to R hip. Will monitor this.    OBJECTIVE IMPAIRMENTS: Abnormal gait, decreased activity tolerance, decreased balance, decreased endurance, decreased mobility, difficulty walking, decreased ROM, decreased strength, increased muscle spasms, impaired flexibility, improper body mechanics, postural dysfunction, and pain.    ACTIVITY LIMITATIONS: carrying, lifting, bending, standing, squatting, transfers, hygiene/grooming, locomotion level, and caring for others  PARTICIPATION LIMITATIONS: meal prep, cleaning, laundry, shopping, community activity, and yard work  PERSONAL FACTORS: Fitness, Time since onset of injury/illness/exacerbation, and 3+ comorbidities: HTN, HLD, DM, hx back pain  are also affecting patient's functional outcome.   REHAB POTENTIAL: Good  CLINICAL DECISION MAKING: Stable/uncomplicated  EVALUATION COMPLEXITY: Low   GOALS: Goals reviewed with patient? Yes  SHORT TERM GOALS: Target date: 05/28/2023    Patient will be independent with HEP in order to improve functional outcomes. Baseline:  Goal status: MET (9/30)  2.  Patient will report at least 25% improvement in symptoms for improved quality of life. Baseline:  Goal status: MET  3. Patient will increase left hip ER to full without pain  Goal status: initial   4. Patient will increase right quad strength by 10 lbs  Goal status:  initial    LONG TERM GOALS: Target date: 06/25/2023    Patient will report at least 75% improvement in symptoms for improved quality of life. Baseline:  Goal status: IN PROGRESS  2.  Patient will improve FOTO score by at least 9 points in order to indicate improved tolerance to activity. Baseline: 53% function 06/18/23: 53% function 53% 12/09 Goal status: INITIAL  3.  Patient will demonstrate at least 25% improvement in lumbar ROM in all restricted planes for improved ability to move trunk while completing chores. Baseline:  Goal status: MET  4.  Patient will report improved confidence with gait, balance, and strength in [place] to return to general exercise routine. Baseline:  Goal status: improving 12/9  5.  Patient will demonstrate 5lb increase in muscle testing in all tested musculature as evidence of improved strength to assist with stair ambulation and gait.   Baseline: see  above Goal status: MET     PLAN:  PT FREQUENCY: 2x/week  PT DURATION: 8 weeks  PLANNED INTERVENTIONS: Therapeutic exercises, Therapeutic activity, Neuromuscular re-education, Balance training, Gait training, Patient/Family education, Joint manipulation, Joint mobilization, Stair training, Orthotic/Fit training, DME instructions, Aquatic Therapy, Dry Needling, Electrical stimulation, Spinal manipulation, Spinal mobilization, Cryotherapy, Moist heat, Compression bandaging, scar mobilization, Splintting, Taping, Traction, Ultrasound, Ionotophoresis 4mg /ml Dexamethasone, and Manual therapy  PLAN FOR NEXT SESSION: test balance, hip strength, functional strength, lumbar mobility, postural and core strength    Donnel Saxon Hopie Pellegrin, PTA 09/23/2023, 4:36 PM    Referring diagnosis? M54.42 (ICD-10-CM) - Left-sided low back pain with left-sided sciatica, unspecified chronicity Treatment diagnosis? (if different than referring diagnosis) M54.59 What was this (referring dx) caused by? []  Surgery [x]  Fall [x]  Ongoing issue []  Arthritis []  Other: ____________  Laterality: []  Rt []  Lt [x]  Both  Check all possible CPT codes:  *CHOOSE 10 OR LESS*    []  16109 (Therapeutic Exercise)  []  92507 (SLP Treatment)  []  97112 (Neuro Re-ed)   []  92526 (Swallowing Treatment)   a 60454 (Gait Training)   []  [id number] (Cognitive Training, 1st 15 minutes) []  97140 (Manual Therapy)   []  97130 (Cognitive Training, each add'l 15 minutes)  []  97164 (Re-evaluation)                              []  Other, List CPT Code ____________  []  97530 (Therapeutic Activities)     []  97535 (Self Care)   [x]  All codes above (97110 - 97535)  []  97012 (Mechanical Traction)  []  97014 (E-stim Unattended)  []  97032 (E-stim manual)  []  97033 (Ionto)  []  97035 (Ultrasound) []  97750 (Physical Performance Training) []  [id number] (Aquatic Therapy) []  97016 (Vasopneumatic Device) []  [id number] (Paraffin) []  97034 (Contrast Bath) []  97597 (Wound  Care 1st 20 sq cm) []  97598 (Wound Care each add'l 20 sq cm) []  97760 (Orthotic Fabrication, Fitting, Training Initial) []  [id number] (Prosthetic Management and Training Initial) []  [id number] (Orthotic or Prosthetic Training/ Modification Subsequent)

## 2023-09-23 NOTE — Progress Notes (Signed)
PATIENT CHECK-IN and HEALTH RISK ASSESSMENT QUESTIONNAIRE:  -completed by phone/video for upcoming Medicare Preventive Visit  -PLEASE SELECT "NOT IN PERSON" for the method of visit.    Pre-Visit Check-in: 1)Vitals (height, wt, BP, etc) - record in vitals section for visit on day of visit Request home vitals (wt, BP, etc.) and enter into vitals, THEN update Vital Signs SmartPhrase below at the top of the HPI. See below.  2)Review and Update Medications, Allergies PMH, Surgeries, Social history in Epic 3)Hospitalizations in the last year with date/reason?  no  4)Review and Update Care Team (patient's specialists) in Epic 5) Complete PHQ9 in Epic  6) Complete Fall Screening in Epic 7)Review all Health Maintenance Due and order under PCP if not done.  Medicare Wellness Patient Questionnaire:  Answer theses question about your habits: How often do you have a drink containing alcohol? none Have you ever smoked? never Do you use an illicit drugs?never On average, how many days per week do you engage in moderate to strenuous exercise (like a brisk walk)? Doing PT exercises daily. Home exercises on the days not at PT.  Diet: feels has a healthy diet, she is trying to make sure she is getting protein - but does not like red meat, likes fish, fruits. Eats some veggies. Tries to avoid white starches.   Beverages: water, unsweet tea  Answer theses question about your everyday activities: Can you perform most household chores? yes Are you deaf or have significant trouble hearing?no Do you feel that you have a problem with memory?no Do you feel safe at home? yes Last dentist visit? Going next week 8. Do you have any difficulty performing your everyday activities? no Are you having any difficulty walking, taking medications on your own, and or difficulty managing daily home needs? no Do you have difficulty walking or climbing stairs? no Do you have difficulty dressing or bathing?no Do you have  difficulty doing errands alone such as visiting a doctor's office or shopping?no Do you currently have any difficulty preparing food and eating? no Do you currently have any difficulty using the toilet?no Do you have any difficulty managing your finances?no Do you have any difficulties with housekeeping of managing your housekeeping? no   Do you have Advanced Directives in place (Living Will, Healthcare Power or Attorney)?   yes   Last eye Exam and location? Has not had an eye exam in awhile other than [facility], she plans to schedule   Do you currently use prescribed or non-prescribed narcotic or opioid pain medications? no       ----------------------------------------------------------------------------------------------------------------------------------------------------------------------------------------------------------------------  Because this visit was a virtual/telehealth visit, some criteria may be missing or patient reported. Any vitals not documented were not able to be obtained and vitals that have been documented are patient reported.    MEDICARE ANNUAL PREVENTIVE VISIT WITH PROVIDER: (Welcome to Medicare, initial annual wellness or annual wellness exam)  Virtual Visit via Phone Note  I connected with Joanne Mendoza on 09/23/23 by phone and verified that I am speaking with the correct person using two identifiers. She declined a video visit due to technology restrictions and preferred audio only.   Location patient: car - [place] Location provider:work or home office Persons participating in the virtual visit: patient, provider  Concerns and/or follow up today: doing ok other than some orthopedic issues. Seeing ortho and getting P after some falls. Balance and strength and stamina have improved with PT.    See  HM section in Epic for other details of completed HM.    ROS: negative for report of fevers, unintentional weight  loss, vision changes, vision loss, hearing loss or change, chest pain, sob, hemoptysis, melena, hematochezia, hematuria, falls, bleeding or bruising, thoughts of suicide or self harm, memory loss  Patient-completed extensive health risk assessment - reviewed and discussed with the patient: See Health Risk Assessment completed with patient prior to the visit either above or in recent phone note. This was reviewed in detailed with the patient today and appropriate recommendations, orders and referrals were placed as needed per Summary below and patient instructions.   Review of Medical History: -PMH, PSH, Family History and current specialty and care providers reviewed and updated and listed below   Patient Care Team: Kristian Covey, MD as PCP - General   Past Medical History:  Diagnosis Date   Hyperlipidemia    Hypertension     Past Surgical History:  Procedure Laterality Date   broken arm     CESAREAN SECTION     x 2     Social History   Socioeconomic History   Marital status: Divorced    Spouse name: Not on file   Number of children: Not on file   Years of education: Not on file   Highest education level: Not on file  Occupational History   Occupation: retired  Tobacco Use   Smoking status: Never   Smokeless tobacco: Never  Vaping Use   Vaping status: Never Used  Substance and Sexual Activity   Alcohol use: No    Comment: 4 times a year wine   Drug use: No   Sexual activity: Not on file  Other Topics Concern   Not on file  Social History Narrative   Not on file   Social Drivers of Health   Financial Resource Strain: Low Risk  (07/10/2021)   Overall Financial Resource Strain (CARDIA)    Difficulty of Paying Living Expenses: Not hard at all  Food Insecurity: No Food Insecurity (07/10/2021)   Hunger Vital Sign    Worried About Running Out of Food in the Last Year: Never true    Ran Out of Food in the Last Year: Never true  Transportation Needs: No  Transportation Needs (07/10/2021)   PRAPARE - Administrator, Civil Service (Medical): No    Lack of Transportation (Non-Medical): No  Physical Activity: Inactive (07/10/2021)   Exercise Vital Sign    Days of Exercise per Week: 0 days    Minutes of Exercise per Session: 0 min  Stress: No Stress Concern Present (07/10/2021)   [employer] of Occupational Health - Occupational Stress Questionnaire    Feeling of Stress : Not at all  Social Connections: Moderately Integrated (06/14/2020)   Social Connection and Isolation Panel [NHANES]    Frequency of Communication with Friends and Family: More than three times a week    Frequency of Social Gatherings with Friends and Family: More than three times a week    Attends Religious Services: More than 4 times per year    Active Member of [employer] or Organizations: Yes    Attends Banker Meetings: 1 to 4 times per year    Marital Status: Divorced  Intimate Partner Violence: Not At Risk (06/14/2020)   Humiliation, Afraid, Rape, and Kick questionnaire    Fear of Current or Ex-Partner: No    Emotionally Abused: No    Physically Abused: No    Sexually Abused:  No    Family History  Problem Relation Age of Onset   Alcohol abuse Father    Hyperlipidemia Other        Grandparent    Hypertension Mother    Colon cancer Paternal Uncle    Pancreatic cancer Paternal Uncle     Current Outpatient Medications on File Prior to Visit  Medication Sig Dispense Refill   amLODipine (NORVASC) 5 MG tablet Take 1 tablet (5 mg total) by mouth daily. 90 tablet 1   atorvastatin (LIPITOR) 40 MG tablet Take 1 tablet (40 mg total) by mouth daily. 90 tablet 1   losartan-hydrochlorothiazide (HYZAAR) 100-12.5 MG tablet Take 1 tablet by mouth daily. 90 tablet 3   metFORMIN (GLUCOPHAGE) 500 MG tablet TAKE 2 TABLETS BY MOUTH TWICE DAILY 360 tablet 1   metoprolol succinate (TOPROL-XL) 50 MG 24 hr tablet Take 1 tablet (50 mg total) by mouth daily.  Take with or immediately following a meal.TAKE 1 TABLET BY MOUTH EVERY DAY WITH OR IMMEDIATELY FOLLOWING A MEAL 90 tablet 1   PREVIDENT 5000 PLUS 1.1 % CREA dental cream Take by mouth.     No current facility-administered medications on file prior to visit.    Allergies  Allergen Reactions   Penicillins     REACTION: hives       Physical Exam Vitals requested from patient and listed below if patient had equipment and was able to obtain at home for this virtual visit: There were no vitals filed for this visit. Estimated body mass index is 33.64 kg/m as calculated from the following:   Height as of 03/31/23: 5' 5.5" (1.664 m).   Weight as of 03/31/23: 205 lb 4.8 oz (93.1 kg).  EKG (optional): deferred due to virtual visit  GENERAL: alert, oriented, no acute distress detected, full vision exam deferred due to pandemic and/or virtual encounter  PSYCH/NEURO: pleasant and cooperative, no obvious depression or anxiety, speech and thought processing grossly intact, Cognitive function grossly intact  Flowsheet Row Video Visit from 09/17/2022 in [facility] [place]  PHQ-9 Total Score 0           09/23/2023    1:02 PM 09/17/2022   12:33 PM 06/07/2022    2:19 PM 07/10/2021    1:33 PM 06/14/2020    1:13 PM  Depression screen PHQ 2/9  Decreased Interest 0 0 0 0 0  Down, Depressed, Hopeless 0 0 0 0 0  PHQ - 2 Score 0 0 0 0 0  Altered sleeping  0     Tired, decreased energy  0     Change in appetite  0     Feeling bad or failure about yourself   0     Trouble concentrating  0     Moving slowly or fidgety/restless  0     Suicidal thoughts  0     PHQ-9 Score  0     Difficult doing work/chores  Not difficult at all          04/21/2020    1:17 PM 06/14/2020    1:17 PM 07/10/2021    1:33 PM 06/07/2022    2:19 PM 09/23/2023   12:59 PM  Fall Risk  Falls in the past year? 0 1 0 1 1  Was there an injury with Fall? 0 1  0 1  Was there an injury with Fall? -  Comments  bruise knee and scratch hands     Fall Risk Category Calculator 0  3  1 2   Fall Risk Category (Retired) Low High  Low   (RETIRED) Patient Fall Risk Level Low fall risk  Low fall risk Low fall risk   Patient at Risk for Falls Due to   Medication side effect No Fall Risks History of fall(s)  Fall risk Follow up Falls evaluation completed  Falls evaluation completed;Education provided;Falls prevention discussed Falls evaluation completed Falls evaluation completed;Education provided;Falls prevention discussed     SUMMARY AND PLAN:  Medicare annual wellness visit, subsequent   Discussed applicable health maintenance/preventive health measures and advised and referred or ordered per patient preferences: -she has the cologuard test - reports needs to complete; advised to check if expired and call for new test if so and complete promptly -she sees Dr. Caryl Never next week and plans to labs/ foot exam there -she is considering the dexa but declined me re-ordering today -reports she had her updated covid and flu shots in November, she agrees to obtain record for Dr. Leonard Schwartz -she is considering the tetanus and shingles vaccines and knows can get at the pharmacy -she plans to schedule eye exam  Health Maintenance  Topic Date Due   Hepatitis C Screening  Never done   Colonoscopy  Never done   Zoster Vaccines- Shingrix (1 of 2) 09/24/1997   DEXA SCAN  Never done   FOOT EXAM  08/19/2020   DTaP/Tdap/Td (2 - Td or Tdap) 03/23/2023   INFLUENZA VACCINE  04/03/2023   COVID-19 Vaccine (7 - 2024-25 season) 05/04/2023   Diabetic kidney evaluation - eGFR measurement  09/28/2023   Diabetic kidney evaluation - Urine ACR  09/28/2023   HEMOGLOBIN A1C  10/01/2023   OPHTHALMOLOGY EXAM  05/26/2024   Medicare Annual Wellness (AWV)  09/22/2024   Pneumonia Vaccine 59+ Years old  Completed   HPV VACCINES  Aged Out      Education and counseling on the following was provided based on the above review of  health and a plan/checklist for the patient, along with additional information discussed, was provided for the patient in the patient instructions :  -Provided counseling and plan for increased risk of falling if applicable per above screening. She is doing balance exercises with PT and provided safe balance exercises that can be done at home to improve balance and discussed exercise guidelines for adults with include balance exercises at least 3 days per week. Discusse dfall prevention.  -Advised and counseled on a healthy lifestyle - including the importance of a healthy diet, regular physical activity -Reviewed patient's current diet. Advised and counseled on a whole foods based healthy diet. A summary of a healthy diet was provided in the Patient Instructions.  -reviewed patient's current physical activity level and discussed exercise guidelines for adults. Discussed community resources and ideas for safe exercise at home to assist in meeting exercise guideline recommendations in a safe and healthy way.  -advised regular dental and eye exams  Follow up: see patient instructions     There are no Patient Instructions on file for this visit.  Terressa Koyanagi, DO

## 2023-10-01 ENCOUNTER — Ambulatory Visit: Payer: Medicare PPO

## 2023-10-01 ENCOUNTER — Ambulatory Visit: Payer: Medicare PPO | Admitting: Family Medicine

## 2023-10-01 ENCOUNTER — Encounter: Payer: Self-pay | Admitting: Family Medicine

## 2023-10-01 ENCOUNTER — Other Ambulatory Visit: Payer: Self-pay | Admitting: Family Medicine

## 2023-10-01 VITALS — BP 130/70 | HR 75 | Temp 98.1°F | Wt 206.9 lb

## 2023-10-01 DIAGNOSIS — M25552 Pain in left hip: Secondary | ICD-10-CM

## 2023-10-01 DIAGNOSIS — E785 Hyperlipidemia, unspecified: Secondary | ICD-10-CM | POA: Diagnosis not present

## 2023-10-01 DIAGNOSIS — E1169 Type 2 diabetes mellitus with other specified complication: Secondary | ICD-10-CM

## 2023-10-01 DIAGNOSIS — I10 Essential (primary) hypertension: Secondary | ICD-10-CM

## 2023-10-01 DIAGNOSIS — E1165 Type 2 diabetes mellitus with hyperglycemia: Secondary | ICD-10-CM | POA: Diagnosis not present

## 2023-10-01 DIAGNOSIS — M160 Bilateral primary osteoarthritis of hip: Secondary | ICD-10-CM | POA: Diagnosis not present

## 2023-10-01 DIAGNOSIS — I878 Other specified disorders of veins: Secondary | ICD-10-CM | POA: Diagnosis not present

## 2023-10-01 LAB — POCT GLYCOSYLATED HEMOGLOBIN (HGB A1C): Hemoglobin A1C: 5.9 % — AB (ref 4.0–5.6)

## 2023-10-01 LAB — MICROALBUMIN / CREATININE URINE RATIO
Creatinine,U: 221.1 mg/dL
Microalb Creat Ratio: 1.5 mg/g (ref 0.0–30.0)
Microalb, Ur: 3.4 mg/dL — ABNORMAL HIGH (ref 0.0–1.9)

## 2023-10-01 LAB — HEPATIC FUNCTION PANEL
ALT: 23 U/L (ref 0–35)
AST: 22 U/L (ref 0–37)
Albumin: 4.3 g/dL (ref 3.5–5.2)
Alkaline Phosphatase: 87 U/L (ref 39–117)
Bilirubin, Direct: 0.3 mg/dL (ref 0.0–0.3)
Total Bilirubin: 1.4 mg/dL — ABNORMAL HIGH (ref 0.2–1.2)
Total Protein: 7.2 g/dL (ref 6.0–8.3)

## 2023-10-01 LAB — LIPID PANEL
Cholesterol: 133 mg/dL (ref 0–200)
HDL: 61 mg/dL (ref >39.00)
LDL Cholesterol: 50 mg/dL (ref 0–99)
NonHDL: 71.87
Total CHOL/HDL Ratio: 2
Triglycerides: 107 mg/dL (ref 0.0–149.0)
VLDL: 21.4 mg/dL (ref 0.0–40.0)

## 2023-10-01 LAB — BASIC METABOLIC PANEL
BUN: 12 mg/dL (ref 6–23)
CO2: 26 meq/L (ref 19–32)
Calcium: 9.7 mg/dL (ref 8.4–10.5)
Chloride: 97 meq/L (ref 96–112)
Creatinine, Ser: 0.65 mg/dL (ref 0.40–1.20)
GFR: 85.77 mL/min (ref >60.00)
Glucose, Bld: 117 mg/dL — ABNORMAL HIGH (ref 70–99)
Potassium: 4.2 meq/L (ref 3.5–5.1)
Sodium: 132 meq/L — ABNORMAL LOW (ref 135–145)

## 2023-10-01 MED ORDER — LOSARTAN POTASSIUM-HCTZ 100-12.5 MG PO TABS: 1.0000 | ORAL_TABLET | Freq: Every day | ORAL | 3 refills | Status: DC

## 2023-10-01 NOTE — Progress Notes (Unsigned)
Established Patient Office Visit  Subjective   Patient ID: ZARIANA STRUB, female    DOB: [birth year]  Age: 78 y.o. MRN: [id number]  Chief Complaint  Patient presents with   Medical Management of Chronic Issues    HPI  {History (Optional):23778} Judythe is seen for routine medical follow-up.  She has hypertension, hyperlipidemia, type 2 diabetes.  She has made some dietary changes and is gradually lost some weight.  She been very involved with taking care of her mom who has advanced dementia and is basically home bound.  Chantale has had some issues with ongoing hip pain especially left hip. Denies any injury.  Increased stiffness.  Starting to affect quality of life and ability to ambulate.  Medications reviewed.  Compliant with all.  Denies any medication side effects.  Needs follow-up labs.  Past Medical History:  Diagnosis Date   Hyperlipidemia    Hypertension    Past Surgical History:  Procedure Laterality Date   broken arm     CESAREAN SECTION     x 2     reports that she has never smoked. She has never used smokeless tobacco. She reports that she does not drink alcohol and does not use drugs. family history includes Alcohol abuse in her father; Colon cancer in her paternal uncle; Hyperlipidemia in an other family member; Hypertension in her mother; Pancreatic cancer in her paternal uncle. Allergies  Allergen Reactions   Penicillins     REACTION: hives    Review of Systems  Constitutional:  Negative for malaise/fatigue.  Eyes:  Negative for blurred vision.  Respiratory:  Negative for shortness of breath.   Cardiovascular:  Negative for chest pain.  Neurological:  Negative for dizziness, weakness and headaches.      Objective:     BP 130/70 (BP Location: Left Arm, Patient Position: Sitting, Cuff Size: Normal)   Pulse 75   Temp 98.1 F (36.7 C) (Oral)   Wt 206 lb 14.4 oz (93.8 kg)   SpO2 98%   BMI 33.91 kg/m  {Vitals History (Optional):23777}  Physical  Exam Vitals reviewed.  Constitutional:      Appearance: She is well-developed.  Eyes:     Pupils: Pupils are equal, round, and reactive to light.  Neck:     Thyroid: No thyromegaly.     Vascular: No JVD.  Cardiovascular:     Rate and Rhythm: Normal rate and regular rhythm.     Heart sounds:     No gallop.  Pulmonary:     Effort: Pulmonary effort is normal. No respiratory distress.     Breath sounds: Normal breath sounds. No wheezing or rales.  Musculoskeletal:     Cervical back: Neck supple.     Comments: Left hip reveal somewhat restricted range of motion.  No localized tenderness.  Neurological:     Mental Status: She is alert.      Results for orders placed or performed in visit on 10/01/23  POC HgB A1c  Result Value Ref Range   Hemoglobin A1C 5.9 (A) 4.0 - 5.6 %   HbA1c POC (<> result, manual entry)     HbA1c, POC (prediabetic range)     HbA1c, POC (controlled diabetic range)      {Labs (Optional):23779}  The 10-year ASCVD risk score (Arnett DK, et al., 2019) is: 40.5%    Assessment & Plan:   Problem List Items Addressed This Visit       Unprioritized   Type 2 diabetes mellitus with  hyperglycemia (HCC) - Primary   Relevant Medications   losartan-hydrochlorothiazide (HYZAAR) 100-12.5 MG tablet   Other Relevant Orders   POC HgB A1c (Completed)   Microalbumin / creatinine urine ratio   Hyperlipidemia associated with type 2 diabetes mellitus (HCC)   Relevant Medications   losartan-hydrochlorothiazide (HYZAAR) 100-12.5 MG tablet   Other Relevant Orders   Lipid panel   Hepatic function panel   Essential hypertension   Relevant Medications   losartan-hydrochlorothiazide (HYZAAR) 100-12.5 MG tablet   Other Relevant Orders   Basic metabolic panel   Other Visit Diagnoses       Left hip pain       Relevant Orders   DG Hip Unilat W OR W/O Pelvis 2-3 Views Left     Chronic medical problems as above.  She has had several months if not years of progressive  left hip pain with stiffness.  Suspect osteoarthritis.  Obtain plain films to further evaluate.  If significant joint space loss consider orthopedic referral  -Obtain follow-up labs as above  -Diabetes well-controlled with A1c 5.9%.  Continue current regimen.  -Set up 53-month follow-up  Return in about 6 months (around 03/30/2024).    Evelena Peat, MD

## 2023-10-07 ENCOUNTER — Encounter (HOSPITAL_BASED_OUTPATIENT_CLINIC_OR_DEPARTMENT_OTHER): Payer: Self-pay

## 2023-10-07 ENCOUNTER — Ambulatory Visit (HOSPITAL_BASED_OUTPATIENT_CLINIC_OR_DEPARTMENT_OTHER): Payer: Medicare PPO | Attending: Family Medicine

## 2023-10-07 DIAGNOSIS — R2689 Other abnormalities of gait and mobility: Secondary | ICD-10-CM | POA: Insufficient documentation

## 2023-10-07 DIAGNOSIS — R29898 Other symptoms and signs involving the musculoskeletal system: Secondary | ICD-10-CM | POA: Diagnosis not present

## 2023-10-07 DIAGNOSIS — M6281 Muscle weakness (generalized): Secondary | ICD-10-CM | POA: Insufficient documentation

## 2023-10-07 DIAGNOSIS — M5459 Other low back pain: Secondary | ICD-10-CM | POA: Insufficient documentation

## 2023-10-07 NOTE — Therapy (Addendum)
 OUTPATIENT PHYSICAL THERAPY TREATMENT  Progress Note Reporting Period 08/11/2023 to 10/07/2023  See note below for Objective Data and Assessment of Progress/Goals.       Patient Name: Joanne Mendoza MRN: [id number] DOB:December 21, 1945, 78 y.o., female Today's Date: 10/07/2023    END OF SESSION:  PT End of Session - 10/07/23 1331     Visit Number 21    Number of Visits 32    Date for PT Re-Evaluation 10/06/23    Authorization Type [employer]    Authorization Time Period 20 visits approved  From 12.12.2024 - 02.03.2025    Authorization - Visit Number 8    Authorization - Number of Visits 20    Progress Note Due on Visit 23    PT Start Time 1301    PT Stop Time 1345    PT Time Calculation (min) 44 min    Activity Tolerance Patient tolerated treatment well    Behavior During Therapy WFL for tasks assessed/performed                  Past Medical History:  Diagnosis Date   Hyperlipidemia    Hypertension    Past Surgical History:  Procedure Laterality Date   broken arm     CESAREAN SECTION     x 2    Patient Active Problem List   Diagnosis Date Noted   Hyperlipidemia associated with type 2 diabetes mellitus (HCC) 10/02/2018   White coat hypertension 09/07/2013   Severe obesity (BMI >= 40) (HCC) 05/21/2013   Type 2 diabetes mellitus with hyperglycemia (HCC) 05/20/2013   Essential hypertension 12/12/2008   NONSPECIFIC ABN FINDING RAD & OTH EXAM GU ORGAN 12/12/2008    PCP: Micheal Wolm ORN, MD  REFERRING PROVIDER: Micheal Wolm ORN, MD  REFERRING DIAG: [phone] (ICD-10-CM) - Left-sided low back pain with left-sided sciatica, unspecified chronicity  Rationale for Evaluation and Treatment: Rehabilitation  THERAPY DIAG:  Other low back pain  Other abnormalities of gait and mobility  Other symptoms and signs involving the musculoskeletal system  Muscle weakness (generalized)  ONSET DATE: chronic  SUBJECTIVE:                                                                                                                                                                                            SUBJECTIVE STATEMENT:   EVAL: Patient states used to be more mobile but has decreased over last few years. Had a fall earlier this year when walking [place]. Some swelling and numbness in R knee but has been getting better. Some left leg issues as well. Patient states some  back aches and pains. Sciatic symptoms L hip/low back to L knee. Is afraid of falling so she limits activity which makes it worse, caught up in cycle.   PERTINENT HISTORY:  HTN, HLD, DM  PAIN:  Are you having pain? None at rest, 5-8/10 with activity.   PRECAUTIONS: None  WEIGHT BEARING RESTRICTIONS: No  FALLS:  Has patient fallen in last 6 months? Yes. Number of falls 1   PLOF: Independent  PATIENT GOALS: get more active again and feel better    OBJECTIVE: (objective measures from initial evaluation unless otherwise dated)  PATIENT SURVEYS:  FOTO 53% function 06/18/23: 53% function 12/9:54% 2/4:53%  SCREENING FOR RED FLAGS: Bowel or bladder incontinence: No Spinal tumors: No Cauda equina syndrome: No Compression fracture: No Abdominal aneurysm: No  COGNITION: Overall cognitive status: Within functional limits for tasks assessed     SENSATION: WFL   POSTURE: rounded shoulders, forward head, decreased lumbar lordosis, anterior pelvic tilt, and flexed trunk   PALPATION: Mild ttp lumbar paraspinals, L glutes  LUMBAR ROM:   AROM eval 12/9   Flexion 0% limited 0% limited   Extension 75% limited 50% limited 0% No pain   Right lateral flexion 25% limited 0% limited   Left lateral flexion 25% limited 0% limited   Right rotation     Left rotation      (Blank rows = not tested) * = pain/symptoms  LOWER EXTREMITY ROM:     Passive  Right eval Left eval R 2/4 L 2/4  Hip flexion      Hip extension      Hip abduction      Hip  adduction      Hip internal rotation      Hip external rotation   30active 44passive, 30active  Knee flexion      Knee extension      Ankle dorsiflexion      Ankle plantarflexion      Ankle inversion      Ankle eversion       (Blank rows = not tested) * = pain/symptoms  LOWER EXTREMITY MMT:    MMT Right eval Left eval Right 05/14/23 Left  05/14/23 Right 10/16 Left 06/18/23 Right 12/09 Left 12/09 Right 2/4 Left 2/4  Hip flexion 40.5 39.1   55.4 54.1 39.9 37.1 64.5 59.4  Hip extension   4/5 4/5 4/5 4/5      Hip abduction 43.0 41.4 4-/5 4/5 72.9/ 4-/5 60.5/ 4-/5 43.7 36.5 43.1 25.7!  Hip adduction            Hip internal rotation            Hip external rotation            Knee flexion            Knee extension 73.3 66.9   82 68.3 26.7 43.3 49.7 43.5  Ankle dorsiflexion            Ankle plantarflexion            Ankle inversion            Ankle eversion             (Blank rows = not tested) * = pain/symptoms   FUNCTIONAL TESTS:  5 times sit to stand: 9.53 seconds without UE support, mild unsteadiness no loss of balance   12/9 9 seconds      GAIT: Distance walked: 80 feet Assistive device utilized: None Level of assistance:  Complete Independence Comments: forward trunk, decreased foot clearance, bilateral glute weakness  TODAY'S TREATMENT:                                                                                                                              DATE:   10/07/23 Updated ROM and MMT Goal review FOTO:53% LTR x20 PPT with ab set 5 x10 Bridge  x 10 S/l clams GTB x20ea    09/23/23 LTR x20 PPT with ab set 5 3x10 Bridge 2 x 10 Prone quad stretch 2x30sec ea  Standing PPT against wall 2 x 10  Standing hip abduction 2x10bil Hip hinge 2 x 10  Standing hip extension with PPT and glute set 2x10bil Standing hip flexor stretch at bottom stair  Gait in hall HS and ITB stretching supine bil  09/16/23 LTR x20 PPT with ab set 5 3x10 Bridge 2  x 10 Seated PPT 2 x 10 with 5 second holds  Standing PPT against wall 3 x 10 3-5 second holds Standing PPT 2 x 10 Hip hinge 2 x 10  Standing hip extension with PPT and glute set 1 x 15 bilateral  09/10/23 LTR x20 PPT with ab set 5 2x10 Sidelying clam 3 hold 2x15ea Hip hike off 4 step 3x20bil PPT against wall 3 seconds x 10 Gait in hall x2 laps Prone quad stretch with manual resistance bilaterally Prone hip extension x 10 bilaterally Instruction in [place] stretch with additional quad stretch.   09/04/23 Sidelying clam BTB at knees 3x10 Standing hip flexor stretch 5 x 20 second holds Hip hikes 4 inch step 3 x 20 bilateral PPT with ab set 3 x 10   12/30 STM and IASTM to R quads and hip flexors, R glutes LTR 5 x10ea Thomas stretching Sidelying clam GTB at knees 2x15  Standing hip flexion stretch (sliding LE posteriorly) Standing hip extension 2x10ea Hip hikes 4 step 3x20L -gait in hall   08/20/23 LTR 5 x10ea Piriformis stretch 3 x 20 second holds Active hamstring stretch 5 x 10 second holds Sidelying clam GTB at knees 2 x 10  Standing hip flexor stretch at step 3 x 20 second holds Standing hip adductor stretch with over head reach 10 x 10 second holds   PATIENT EDUCATION:  Education details: Patient educated on exam findings, POC, scope of PT, HEP. 05/14/23: HEP Person educated: Patient Education method: Programmer, Multimedia, Demonstration, and Handouts Education comprehension: verbalized understanding, returned demonstration, verbal cues required, and tactile cues required  HOME EXERCISE PROGRAM: Access Code: [email or web address] URL: [email or web address]   ASSESSMENT:  CLINICAL IMPRESSION: Pt has attended 21 visits of PT thus far and has made steady progress towards goals. Pt has met 2/4 STG and 2/5 LTG. She does remain limited by pain when she is on her feet for prolonged periods. 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 Pt will continue to benefit from L hip strengthening to  reach goals. Will await MD appt with ortho MD.  Patient has met 2/4 short term goals and 2/5 long term goals with ability to complete HEP and improvement in symptoms, strength, ROM, activity tolerance, and functional mobility. Remaining goals not met due to continued deficits in hip symptoms, strength, gait, activity tolerance and functional mobility.. Patient has made good progress toward remaining goals. Extending POC 2x/week for 6 weeks to continue to work toward remaining goals. Patient will continue to benefit from skilled physical therapy in order to improve function and reduce impairment.  9:13 AM, 10/08/23 Prentice CANDIE Stains PT, DPT Physical Therapist at [facility]    OBJECTIVE IMPAIRMENTS: Abnormal gait, decreased activity tolerance, decreased balance, decreased endurance, decreased mobility, difficulty walking, decreased ROM, decreased strength, increased muscle spasms, impaired flexibility, improper body mechanics, postural dysfunction, and pain.   ACTIVITY LIMITATIONS: carrying, lifting, bending, standing, squatting, transfers, hygiene/grooming, locomotion level, and caring for others  PARTICIPATION LIMITATIONS: meal prep, cleaning, laundry, shopping, community activity, and yard work  PERSONAL FACTORS: Fitness, Time since onset of injury/illness/exacerbation, and 3+ comorbidities: HTN, HLD, DM, hx back pain  are also affecting patient's functional outcome.   REHAB POTENTIAL: Good  CLINICAL DECISION MAKING: Stable/uncomplicated  EVALUATION COMPLEXITY: Low   GOALS: Goals reviewed with patient? Yes  SHORT TERM GOALS: Target date: 05/28/2023    Patient will be independent with HEP in order to improve functional outcomes. Baseline:  Goal status: MET (9/30)  2.  Patient will report at least 25% improvement in symptoms for improved quality of life. Baseline:  Goal status: MET  3. Patient will increase  left hip ER to full without pain  Goal status: IN PROGRESS (30deg on 2/4)  4. Patient will increase right quad strength by 10 lbs  Goal status: IN PROGRESS   LONG TERM GOALS: Target date: 06/25/2023    Patient will report at least 75% improvement in symptoms for improved quality of life. Baseline:  Goal status: IN PROGRESS  2.  Patient will improve FOTO score by at least 9 points in order to indicate improved tolerance to activity. Baseline: 53% function 06/18/23: 53% function 53% 12/09 Goal status: INITIAL  3.  Patient will demonstrate at least 25% improvement in lumbar ROM in all restricted planes for improved ability to move trunk while completing chores. Baseline:  Goal status: MET  4.  Patient will report improved confidence with gait, balance, and strength in roder to return to general exercise routine. Baseline:  Goal status: improving 2/4  5.  Patient will demonstrate 5lb increase in muscle testing in all tested musculature as evidence of improved strength to assist with stair ambulation and gait.   Baseline: see above Goal status: MET     PLAN:  PT FREQUENCY: 2x/week  PT DURATION: 6 weeks  PLANNED INTERVENTIONS: Therapeutic exercises, Therapeutic activity, Neuromuscular re-education, Balance training, Gait training, Patient/Family education, Joint manipulation, Joint mobilization, Stair training, Orthotic/Fit training, DME instructions, Aquatic Therapy, Dry Needling, Electrical stimulation, Spinal manipulation, Spinal mobilization, Cryotherapy, Moist heat, Compression bandaging, scar mobilization, Splintting, Taping, Traction, Ultrasound, Ionotophoresis 4mg /ml Dexamethasone , and Manual therapy  PLAN FOR NEXT SESSION: test balance, hip strength, functional strength, lumbar mobility, postural and core strength    Asberry BRAVO Aquila Menzie, PTA 10/07/2023, 2:08 PM    Referring diagnosis? M54.42 (ICD-10-CM) - Left-sided low back pain with left-sided sciatica, unspecified  chronicity Treatment diagnosis? (if different than referring diagnosis) M54.59 What was this (referring dx) caused by? []  Surgery [x]  Fall [x]  Ongoing issue []  Arthritis []  Other: ____________  Laterality: []  Rt []  Lt [x]  Both  Check all possible CPT codes:  *CHOOSE 10 OR LESS*    []  97110 (Therapeutic Exercise)  []  92507 (SLP Treatment)  []  02887 (Neuro Re-ed)   []  92526 (Swallowing Treatment)   a 02883 (Gait Training)   []  [phone] (Cognitive Training, 1st 15 minutes) []  97140 (Manual Therapy)   []  97130 (Cognitive Training, each add'l 15 minutes)  []  97164 (Re-evaluation)                              []  Other, List CPT Code ____________  []  02469 (Therapeutic Activities)     []  97535 (Self Care)   [x]  All codes above (97110 - 97535)  []  97012 (Mechanical Traction)  []  97014 (E-stim Unattended)  []  97032 (E-stim manual)  []  97033 (Ionto)  []  97035 (Ultrasound) []  97750 (Physical Performance Training) []  [id number] (Aquatic Therapy) []  97016 (Vasopneumatic Device) []  [id number] (Paraffin) []  97034 (Contrast Bath) []  97597 (Wound Care 1st 20 sq cm) []  97598 (Wound Care each add'l 20 sq cm) []  97760 (Orthotic Fabrication, Fitting, Training Initial) []  [id number] (Prosthetic Management and Training Initial) []  [id number] (Orthotic or Prosthetic Training/ Modification Subsequent)

## 2023-10-08 ENCOUNTER — Telehealth: Payer: Self-pay

## 2023-10-08 NOTE — Addendum Note (Signed)
 Addended by: Drae Mitzel S on: 10/08/2023 09:16 AM   Modules accepted: Orders

## 2023-10-08 NOTE — Telephone Encounter (Signed)
 Copied from CRM [id number]. Topic: Clinical - Medical Advice >> Oct 08, 2023 11:27 AM Benton KIDD wrote: Reason for CRM: patient is calling to speak with doctor about their conversation Friday concerning patient care  and discuss referral and patient has questions  Call back number [phone]

## 2023-10-08 NOTE — Telephone Encounter (Signed)
 Left a message for the patient to return my call.

## 2023-10-09 ENCOUNTER — Telehealth: Payer: Self-pay | Admitting: *Deleted

## 2023-10-09 DIAGNOSIS — M25552 Pain in left hip: Secondary | ICD-10-CM

## 2023-10-09 NOTE — Telephone Encounter (Signed)
 Copied from CRM [id number]. Topic: General - Other >> Oct 09, 2023 10:30 AM [place] A wrote: Reason for CRM: Patient wants to know if she can cancel her PT appointment tomorrow-please call back

## 2023-10-09 NOTE — Telephone Encounter (Signed)
 Please see most recent encounter

## 2023-10-09 NOTE — Telephone Encounter (Signed)
 Orthopedic referral placed. Patient inquired if she should continue PT with her having osteoarthritis of her left hip?

## 2023-10-10 ENCOUNTER — Encounter (HOSPITAL_BASED_OUTPATIENT_CLINIC_OR_DEPARTMENT_OTHER): Payer: Self-pay | Admitting: Physical Therapy

## 2023-10-10 ENCOUNTER — Ambulatory Visit (HOSPITAL_BASED_OUTPATIENT_CLINIC_OR_DEPARTMENT_OTHER): Payer: Medicare PPO | Admitting: Physical Therapy

## 2023-10-10 DIAGNOSIS — M5459 Other low back pain: Secondary | ICD-10-CM | POA: Diagnosis not present

## 2023-10-10 DIAGNOSIS — M6281 Muscle weakness (generalized): Secondary | ICD-10-CM

## 2023-10-10 DIAGNOSIS — R2689 Other abnormalities of gait and mobility: Secondary | ICD-10-CM

## 2023-10-10 DIAGNOSIS — R29898 Other symptoms and signs involving the musculoskeletal system: Secondary | ICD-10-CM

## 2023-10-10 NOTE — Telephone Encounter (Signed)
 Patient informed of the message below and voiced understanding

## 2023-10-10 NOTE — Therapy (Signed)
 OUTPATIENT PHYSICAL THERAPY TREATMENT      Patient Name: Joanne Mendoza MRN: [id number] DOB:[birth year], 78 y.o., female Today's Date: 10/10/2023    END OF SESSION:  PT End of Session - 10/10/23 1306     Visit Number 22    Number of Visits 44    Date for PT Re-Evaluation 11/18/23    Authorization Type [employer]    Authorization Time Period 10 visits approved  From 02.04.2025 - 03.01.2025    Authorization - Visit Number 2    Authorization - Number of Visits 10    Progress Note Due on Visit 31    PT Start Time 1306    PT Stop Time 1347    PT Time Calculation (min) 41 min    Activity Tolerance Patient tolerated treatment well    Behavior During Therapy WFL for tasks assessed/performed                  Past Medical History:  Diagnosis Date   Hyperlipidemia    Hypertension    Past Surgical History:  Procedure Laterality Date   broken arm     CESAREAN SECTION     x 2    Patient Active Problem List   Diagnosis Date Noted   Hyperlipidemia associated with type 2 diabetes mellitus (HCC) 10/02/2018   White coat hypertension 09/07/2013   Severe obesity (BMI >= 40) (HCC) 05/21/2013   Type 2 diabetes mellitus with hyperglycemia (HCC) 05/20/2013   Essential hypertension 12/12/2008   NONSPECIFIC ABN FINDING RAD & OTH EXAM GU ORGAN 12/12/2008    PCP: Micheal Wolm ORN, MD  REFERRING PROVIDER: Micheal Wolm ORN, MD  REFERRING DIAG: [phone] (ICD-10-CM) - Left-sided low back pain with left-sided sciatica, unspecified chronicity  Rationale for Evaluation and Treatment: Rehabilitation  THERAPY DIAG:  Other low back pain  Other abnormalities of gait and mobility  Other symptoms and signs involving the musculoskeletal system  Muscle weakness (generalized)  ONSET DATE: chronic  SUBJECTIVE:                                                                                                                                                                                            SUBJECTIVE STATEMENT: Patient states wants to continue with PT to keep getting stronger and moving better. Strength is better. Standing a long time can be tough still.   EVAL: Patient states used to be more mobile but has decreased over last few years. Had a fall earlier this year when walking [place]. Some swelling and numbness in R knee but has been getting better. Some left leg issues  as well. Patient states some back aches and pains. Sciatic symptoms L hip/low back to L knee. Is afraid of falling so she limits activity which makes it worse, caught up in cycle.   PERTINENT HISTORY:  HTN, HLD, DM  PAIN:  Are you having pain? None at rest, 5-8/10 with activity.   PRECAUTIONS: None  WEIGHT BEARING RESTRICTIONS: No  FALLS:  Has patient fallen in last 6 months? Yes. Number of falls 1   PLOF: Independent  PATIENT GOALS: get more active again and feel better    OBJECTIVE: (objective measures from initial evaluation unless otherwise dated)  PATIENT SURVEYS:  FOTO 53% function 06/18/23: 53% function 12/9:54% 2/4:53%  SCREENING FOR RED FLAGS: Bowel or bladder incontinence: No Spinal tumors: No Cauda equina syndrome: No Compression fracture: No Abdominal aneurysm: No  COGNITION: Overall cognitive status: Within functional limits for tasks assessed     SENSATION: WFL   POSTURE: rounded shoulders, forward head, decreased lumbar lordosis, anterior pelvic tilt, and flexed trunk   PALPATION: Mild ttp lumbar paraspinals, L glutes  LUMBAR ROM:   AROM eval 12/9   Flexion 0% limited 0% limited   Extension 75% limited 50% limited 0% No pain   Right lateral flexion 25% limited 0% limited   Left lateral flexion 25% limited 0% limited   Right rotation     Left rotation      (Blank rows = not tested) * = pain/symptoms  LOWER EXTREMITY ROM:     Passive  Right eval Left eval R 2/4 L 2/4  Hip flexion      Hip extension      Hip abduction       Hip adduction      Hip internal rotation      Hip external rotation   30active 44passive, 30active  Knee flexion      Knee extension      Ankle dorsiflexion      Ankle plantarflexion      Ankle inversion      Ankle eversion       (Blank rows = not tested) * = pain/symptoms  LOWER EXTREMITY MMT:    MMT Right eval Left eval Right 05/14/23 Left  05/14/23 Right 10/16 Left 06/18/23 Right 12/09 Left 12/09 Right 2/4 Left 2/4  Hip flexion 40.5 39.1   55.4 54.1 39.9 37.1 64.5 59.4  Hip extension   4/5 4/5 4/5 4/5      Hip abduction 43.0 41.4 4-/5 4/5 72.9/ 4-/5 60.5/ 4-/5 43.7 36.5 43.1 25.7!  Hip adduction            Hip internal rotation            Hip external rotation            Knee flexion            Knee extension 73.3 66.9   82 68.3 26.7 43.3 49.7 43.5  Ankle dorsiflexion            Ankle plantarflexion            Ankle inversion            Ankle eversion             (Blank rows = not tested) * = pain/symptoms   FUNCTIONAL TESTS:  5 times sit to stand: 9.53 seconds without UE support, mild unsteadiness no loss of balance   12/9 9 seconds      GAIT: Distance walked: 80 feet Assistive device  utilized: None Level of assistance: Complete Independence Comments: forward trunk, decreased foot clearance, bilateral glute weakness  TODAY'S TREATMENT:                                                                                                                              DATE:  10/10/23 Bridge 1 x 10  Bridge with clam GTB 2 x 10  S/l clams GTB 2 x 15 Standing hip abduction GTB 2 x 10  Weighted retro gait 10# 1 x 10  10/07/23 Updated ROM and MMT Goal review FOTO:53% LTR x20 PPT with ab set 5 x10 Bridge  x 10 S/l clams GTB x20ea    09/23/23 LTR x20 PPT with ab set 5 3x10 Bridge 2 x 10 Prone quad stretch 2x30sec ea  Standing PPT against wall 2 x 10  Standing hip abduction 2x10bil Hip hinge 2 x 10  Standing hip extension with PPT and glute set  2x10bil Standing hip flexor stretch at bottom stair  Gait in hall HS and ITB stretching supine bil  09/16/23 LTR x20 PPT with ab set 5 3x10 Bridge 2 x 10 Seated PPT 2 x 10 with 5 second holds  Standing PPT against wall 3 x 10 3-5 second holds Standing PPT 2 x 10 Hip hinge 2 x 10  Standing hip extension with PPT and glute set 1 x 15 bilateral  09/10/23 LTR x20 PPT with ab set 5 2x10 Sidelying clam 3 hold 2x15ea Hip hike off 4 step 3x20bil PPT against wall 3 seconds x 10 Gait in hall x2 laps Prone quad stretch with manual resistance bilaterally Prone hip extension x 10 bilaterally Instruction in [place] stretch with additional quad stretch.   09/04/23 Sidelying clam BTB at knees 3x10 Standing hip flexor stretch 5 x 20 second holds Hip hikes 4 inch step 3 x 20 bilateral PPT with ab set 3 x 10   12/30 STM and IASTM to R quads and hip flexors, R glutes LTR 5 x10ea Thomas stretching Sidelying clam GTB at knees 2x15  Standing hip flexion stretch (sliding LE posteriorly) Standing hip extension 2x10ea Hip hikes 4 step 3x20L -gait in hall   08/20/23 LTR 5 x10ea Piriformis stretch 3 x 20 second holds Active hamstring stretch 5 x 10 second holds Sidelying clam GTB at knees 2 x 10  Standing hip flexor stretch at step 3 x 20 second holds Standing hip adductor stretch with over head reach 10 x 10 second holds   PATIENT EDUCATION:  Education details: Patient educated on exam findings, POC, scope of PT, HEP. 05/14/23: HEP Person educated: Patient Education method: Programmer, Multimedia, Demonstration, and Handouts Education comprehension: verbalized understanding, returned demonstration, verbal cues required, and tactile cues required  HOME EXERCISE PROGRAM: Access Code: [email or web address] URL: [email or web address]   ASSESSMENT:  CLINICAL IMPRESSION: Discussed patient's POC and benefits of continued strengthening, patient agreeable to continue with PT. Continued with  glute strengthening. Patient will continue to benefit from physical therapy  in order to improve function and reduce impairment.     OBJECTIVE IMPAIRMENTS: Abnormal gait, decreased activity tolerance, decreased balance, decreased endurance, decreased mobility, difficulty walking, decreased ROM, decreased strength, increased muscle spasms, impaired flexibility, improper body mechanics, postural dysfunction, and pain.   ACTIVITY LIMITATIONS: carrying, lifting, bending, standing, squatting, transfers, hygiene/grooming, locomotion level, and caring for others  PARTICIPATION LIMITATIONS: meal prep, cleaning, laundry, shopping, community activity, and yard work  PERSONAL FACTORS: Fitness, Time since onset of injury/illness/exacerbation, and 3+ comorbidities: HTN, HLD, DM, hx back pain  are also affecting patient's functional outcome.   REHAB POTENTIAL: Good  CLINICAL DECISION MAKING: Stable/uncomplicated  EVALUATION COMPLEXITY: Low   GOALS: Goals reviewed with patient? Yes  SHORT TERM GOALS: Target date: 05/28/2023    Patient will be independent with HEP in order to improve functional outcomes. Baseline:  Goal status: MET (9/30)  2.  Patient will report at least 25% improvement in symptoms for improved quality of life. Baseline:  Goal status: MET  3. Patient will increase left hip ER to full without pain  Goal status: IN PROGRESS (30deg on 2/4)  4. Patient will increase right quad strength by 10 lbs  Goal status: IN PROGRESS   LONG TERM GOALS: Target date: 06/25/2023    Patient will report at least 75% improvement in symptoms for improved quality of life. Baseline:  Goal status: IN PROGRESS  2.  Patient will improve FOTO score by at least 9 points in order to indicate improved tolerance to activity. Baseline: 53% function 06/18/23: 53% function 53% 12/09 Goal status: INITIAL  3.  Patient will demonstrate at least 25% improvement in lumbar ROM in all restricted planes for  improved ability to move trunk while completing chores. Baseline:  Goal status: MET  4.  Patient will report improved confidence with gait, balance, and strength in roder to return to general exercise routine. Baseline:  Goal status: improving 2/4  5.  Patient will demonstrate 5lb increase in muscle testing in all tested musculature as evidence of improved strength to assist with stair ambulation and gait.   Baseline: see above Goal status: MET     PLAN:  PT FREQUENCY: 2x/week  PT DURATION: 6 weeks  PLANNED INTERVENTIONS: Therapeutic exercises, Therapeutic activity, Neuromuscular re-education, Balance training, Gait training, Patient/Family education, Joint manipulation, Joint mobilization, Stair training, Orthotic/Fit training, DME instructions, Aquatic Therapy, Dry Needling, Electrical stimulation, Spinal manipulation, Spinal mobilization, Cryotherapy, Moist heat, Compression bandaging, scar mobilization, Splintting, Taping, Traction, Ultrasound, Ionotophoresis 4mg /ml Dexamethasone , and Manual therapy  PLAN FOR NEXT SESSION: test balance, hip strength, functional strength, lumbar mobility, postural and core strength    Prentice GORMAN Stains, PT 10/10/2023, 1:49 PM    Referring diagnosis? M54.42 (ICD-10-CM) - Left-sided low back pain with left-sided sciatica, unspecified chronicity Treatment diagnosis? (if different than referring diagnosis) M54.59 What was this (referring dx) caused by? []  Surgery [x]  Fall [x]  Ongoing issue []  Arthritis []  Other: ____________  Laterality: []  Rt []  Lt [x]  Both  Check all possible CPT codes:  *CHOOSE 10 OR LESS*    []  97110 (Therapeutic Exercise)  []  92507 (SLP Treatment)  []  97112 (Neuro Re-ed)   []  92526 (Swallowing Treatment)   a 02883 (Gait Training)   []  [id number] (Cognitive Training, 1st 15 minutes) []  97140 (Manual Therapy)   []  97130 (Cognitive Training, each add'l 15 minutes)  []  97164 (Re-evaluation)                              []   Other, List CPT Code ____________  []  97530 (Therapeutic Activities)     []  97535 (Self Care)   [x]  All codes above (97110 - 97535)  []  97012 (Mechanical Traction)  []  97014 (E-stim Unattended)  []  97032 (E-stim manual)  []  97033 (Ionto)  []  02964 (Ultrasound) []  97750 (Physical Performance Training) []  [id number] (Aquatic Therapy) []  97016 (Vasopneumatic Device) []  [id number] (Paraffin) []  97034 (Contrast Bath) []  97597 (Wound Care 1st 20 sq cm) []  97598 (Wound Care each add'l 20 sq cm) []  97760 (Orthotic Fabrication, Fitting, Training Initial) []  [id number] (Prosthetic Management and Training Initial) []  [id number] (Orthotic or Prosthetic Training/ Modification Subsequent)

## 2023-10-13 ENCOUNTER — Telehealth: Payer: Self-pay

## 2023-10-13 NOTE — Telephone Encounter (Signed)
 Copied from CRM [id number]. Topic: Clinical - Lab/Test Results >> Oct 13, 2023 10:23 AM Earnestine Goes B wrote: Reason for CRM: pt called regarding xray results. Please call pt back [phone]

## 2023-10-13 NOTE — Telephone Encounter (Signed)
 Please see result note

## 2023-10-14 ENCOUNTER — Ambulatory Visit (HOSPITAL_BASED_OUTPATIENT_CLINIC_OR_DEPARTMENT_OTHER): Payer: Medicare PPO

## 2023-10-14 ENCOUNTER — Encounter (HOSPITAL_BASED_OUTPATIENT_CLINIC_OR_DEPARTMENT_OTHER): Payer: Self-pay

## 2023-10-14 DIAGNOSIS — M6281 Muscle weakness (generalized): Secondary | ICD-10-CM

## 2023-10-14 DIAGNOSIS — R29898 Other symptoms and signs involving the musculoskeletal system: Secondary | ICD-10-CM

## 2023-10-14 DIAGNOSIS — M5459 Other low back pain: Secondary | ICD-10-CM

## 2023-10-14 DIAGNOSIS — R2689 Other abnormalities of gait and mobility: Secondary | ICD-10-CM | POA: Diagnosis not present

## 2023-10-14 NOTE — Therapy (Signed)
OUTPATIENT PHYSICAL THERAPY TREATMENT      Patient Name: Joanne Mendoza MRN: [id number] DOB:31-Dec-1945, 78 y.o., female Today's Date: 10/14/2023    END OF SESSION:  PT End of Session - 10/14/23 1334     Visit Number 23    Number of Visits 44    Date for PT Re-Evaluation 11/18/23    Authorization Type [employer]    Authorization Time Period 10 visits approved  From 02.04.2025 - 03.01.2025    Authorization - Visit Number 3    Authorization - Number of Visits 10    Progress Note Due on Visit 31    PT Start Time 1145    PT Stop Time 1230    PT Time Calculation (min) 45 min    Activity Tolerance Patient tolerated treatment well    Behavior During Therapy WFL for tasks assessed/performed                   Past Medical History:  Diagnosis Date   Hyperlipidemia    Hypertension    Past Surgical History:  Procedure Laterality Date   broken arm     CESAREAN SECTION     x 2    Patient Active Problem List   Diagnosis Date Noted   Hyperlipidemia associated with type 2 diabetes mellitus (HCC) 10/02/2018   White coat hypertension 09/07/2013   Severe obesity (BMI >= 40) (HCC) 05/21/2013   Type 2 diabetes mellitus with hyperglycemia (HCC) 05/20/2013   Essential hypertension 12/12/2008   NONSPECIFIC ABN FINDING RAD & OTH EXAM GU ORGAN 12/12/2008    PCP: Kristian Covey, MD  REFERRING PROVIDER: Kristian Covey, MD  REFERRING DIAG: [phone] (ICD-10-CM) - Left-sided low back pain with left-sided sciatica, unspecified chronicity  Rationale for Evaluation and Treatment: Rehabilitation  THERAPY DIAG:  Other low back pain  Other abnormalities of gait and mobility  Other symptoms and signs involving the musculoskeletal system  Muscle weakness (generalized)  ONSET DATE: chronic  SUBJECTIVE:                                                                                                                                                                                            SUBJECTIVE STATEMENT: Pt reports she has still not heard from ortho MD. 8/10 pain in L hip. "Not constant."  EVAL: Patient states used to be more mobile but has decreased over last few years. Had a fall earlier this year when walking [place]. Some swelling and numbness in R knee but has been getting better. Some left leg issues as well. Patient states some back aches and  pains. Sciatic symptoms L hip/low back to L knee. Is afraid of falling so she limits activity which makes it worse, caught up in cycle.   PERTINENT HISTORY:  HTN, HLD, DM  PAIN:  Are you having pain? None at rest, 5-8/10 with activity.   PRECAUTIONS: None  WEIGHT BEARING RESTRICTIONS: No  FALLS:  Has patient fallen in last 6 months? Yes. Number of falls 1   PLOF: Independent  PATIENT GOALS: get more active again and feel better    OBJECTIVE: (objective measures from initial evaluation unless otherwise dated)  PATIENT SURVEYS:  FOTO 53% function 06/18/23: 53% function 12/9:54% 2/4:53%  SCREENING FOR RED FLAGS: Bowel or bladder incontinence: No Spinal tumors: No Cauda equina syndrome: No Compression fracture: No Abdominal aneurysm: No  COGNITION: Overall cognitive status: Within functional limits for tasks assessed     SENSATION: WFL   POSTURE: rounded shoulders, forward head, decreased lumbar lordosis, anterior pelvic tilt, and flexed trunk   PALPATION: Mild ttp lumbar paraspinals, L glutes  LUMBAR ROM:   AROM eval 12/9   Flexion 0% limited 0% limited   Extension 75% limited 50% limited 0% No pain   Right lateral flexion 25% limited 0% limited   Left lateral flexion 25% limited 0% limited   Right rotation     Left rotation      (Blank rows = not tested) * = pain/symptoms  LOWER EXTREMITY ROM:     Passive  Right eval Left eval R 2/4 L 2/4  Hip flexion      Hip extension      Hip abduction      Hip adduction      Hip internal rotation      Hip  external rotation   30active 44passive, 30active  Knee flexion      Knee extension      Ankle dorsiflexion      Ankle plantarflexion      Ankle inversion      Ankle eversion       (Blank rows = not tested) * = pain/symptoms  LOWER EXTREMITY MMT:    MMT Right eval Left eval Right 05/14/23 Left  05/14/23 Right 10/16 Left 06/18/23 Right 12/09 Left 12/09 Right 2/4 Left 2/4  Hip flexion 40.5 39.1   55.4 54.1 39.9 37.1 64.5 59.4  Hip extension   4/5 4/5 4/5 4/5      Hip abduction 43.0 41.4 4-/5 4/5 72.9/ 4-/5 60.5/ 4-/5 43.7 36.5 43.1 25.7!  Hip adduction            Hip internal rotation            Hip external rotation            Knee flexion            Knee extension 73.3 66.9   82 68.3 26.7 43.3 49.7 43.5  Ankle dorsiflexion            Ankle plantarflexion            Ankle inversion            Ankle eversion             (Blank rows = not tested) * = pain/symptoms   FUNCTIONAL TESTS:  5 times sit to stand: 9.53 seconds without UE support, mild unsteadiness no loss of balance   12/9 9 seconds      GAIT: Distance walked: 80 feet Assistive device utilized: None Level of assistance: Complete Independence Comments:  forward trunk, decreased foot clearance, bilateral glute weakness  TODAY'S TREATMENT:                                                                                                                              DATE:   Treatment                            10/14/2023: Blank lines following charge title = not provided on this treatment date.   Manual:  TPDN No  There-ex: LTR x20 S/l clams GTB 2x15 3sec hold Seated HSS 2x30sec each Standing hip abduction GTB 3x10ea There-Act: Bridges 3x10 Squats at rail 2x10 Self Care:  Nuro-Re-ed: Hip hike off 4" step 3x20bil Supine marching with GTB x30 Supine SLR 2x10ea Gait Training:     10/10/23 Bridge 1 x 10  Bridge with clam GTB 2 x 10  S/l clams GTB 2 x 15 Standing hip abduction GTB 2 x 10   Weighted retro gait 10# 1 x 10  10/07/23 Updated ROM and MMT Goal review FOTO:53% LTR x20 PPT with ab set 5" x10 Bridge  x 10 S/l clams GTB x20ea    09/23/23 LTR x20 PPT with ab set 5" 3x10 Bridge 2 x 10 Prone quad stretch 2x30sec ea  Standing PPT against wall 2 x 10  Standing hip abduction 2x10bil Hip hinge 2 x 10  Standing hip extension with PPT and glute set 2x10bil Standing hip flexor stretch at bottom stair  Gait in hall HS and ITB stretching supine bil  09/16/23 LTR x20 PPT with ab set 5" 3x10 Bridge 2 x 10 Seated PPT 2 x 10 with 5 second holds  Standing PPT against wall 3 x 10 3-5 second holds Standing PPT 2 x 10 Hip hinge 2 x 10  Standing hip extension with PPT and glute set 1 x 15 bilateral  09/10/23 LTR x20 PPT with ab set 5" 2x10 Sidelying clam 3" hold 2x15ea Hip hike off 4" step 3x20bil PPT against wall 3 seconds x 10 Gait in hall x2 laps Prone quad stretch with manual resistance bilaterally Prone hip extension x 10 bilaterally Instruction in [place] stretch with additional quad stretch.   09/04/23 Sidelying clam BTB at knees 3x10 Standing hip flexor stretch 5 x 20 second holds Hip hikes 4 inch step 3 x 20 bilateral PPT with ab set 3 x 10   12/30 STM and IASTM to R quads and hip flexors, R glutes LTR 5" x10ea Thomas stretching Sidelying clam GTB at knees 2x15  Standing hip flexion stretch (sliding LE posteriorly) Standing hip extension 2x10ea Hip hikes 4" step 3x20L -gait in hall   08/20/23 LTR 5" x10ea Piriformis stretch 3 x 20 second holds Active hamstring stretch 5 x 10 second holds Sidelying clam GTB at knees 2 x 10  Standing hip flexor stretch at step 3 x 20 second holds Standing hip adductor stretch with over  head reach 10 x 10 second holds   PATIENT EDUCATION:  Education details: Patient educated on exam findings, POC, scope of PT, HEP. 05/14/23: HEP Person educated: Patient Education method: Programmer, multimedia, Demonstration, and  Handouts Education comprehension: verbalized understanding, returned demonstration, verbal cues required, and tactile cues required  HOME EXERCISE PROGRAM: Access Code: [email or web address] URL: [email or web address]   ASSESSMENT:  CLINICAL IMPRESSION: Pt continues to progress well with strengthening. Able to complete squats today without pain. She was challenged by GTB with standing hip abduction. Good performance with hip hikes. Pt will continue to benefit from strengthening as pt waits to hear from ortho MD. Will   OBJECTIVE IMPAIRMENTS: Abnormal gait, decreased activity tolerance, decreased balance, decreased endurance, decreased mobility, difficulty walking, decreased ROM, decreased strength, increased muscle spasms, impaired flexibility, improper body mechanics, postural dysfunction, and pain.   ACTIVITY LIMITATIONS: carrying, lifting, bending, standing, squatting, transfers, hygiene/grooming, locomotion level, and caring for others  PARTICIPATION LIMITATIONS: meal prep, cleaning, laundry, shopping, community activity, and yard work  PERSONAL FACTORS: Fitness, Time since onset of injury/illness/exacerbation, and 3+ comorbidities: HTN, HLD, DM, hx back pain  are also affecting patient's functional outcome.   REHAB POTENTIAL: Good  CLINICAL DECISION MAKING: Stable/uncomplicated  EVALUATION COMPLEXITY: Low   GOALS: Goals reviewed with patient? Yes  SHORT TERM GOALS: Target date: 05/28/2023    Patient will be independent with HEP in order to improve functional outcomes. Baseline:  Goal status: MET (9/30)  2.  Patient will report at least 25% improvement in symptoms for improved quality of life. Baseline:  Goal status: MET  3. Patient will increase left hip ER to full without pain  Goal status: IN PROGRESS (30deg on 2/4)  4. Patient will increase right quad strength by 10 lbs  Goal status: IN PROGRESS   LONG TERM GOALS: Target date: 06/25/2023    Patient will  report at least 75% improvement in symptoms for improved quality of life. Baseline:  Goal status: IN PROGRESS  2.  Patient will improve FOTO score by at least 9 points in order to indicate improved tolerance to activity. Baseline: 53% function 06/18/23: 53% function 53% 12/09 Goal status: INITIAL  3.  Patient will demonstrate at least 25% improvement in lumbar ROM in all restricted planes for improved ability to move trunk while completing chores. Baseline:  Goal status: MET  4.  Patient will report improved confidence with gait, balance, and strength in roder to return to general exercise routine. Baseline:  Goal status: improving 2/4  5.  Patient will demonstrate 5lb increase in muscle testing in all tested musculature as evidence of improved strength to assist with stair ambulation and gait.   Baseline: see above Goal status: MET     PLAN:  PT FREQUENCY: 2x/week  PT DURATION: 6 weeks  PLANNED INTERVENTIONS: Therapeutic exercises, Therapeutic activity, Neuromuscular re-education, Balance training, Gait training, Patient/Family education, Joint manipulation, Joint mobilization, Stair training, Orthotic/Fit training, DME instructions, Aquatic Therapy, Dry Needling, Electrical stimulation, Spinal manipulation, Spinal mobilization, Cryotherapy, Moist heat, Compression bandaging, scar mobilization, Splintting, Taping, Traction, Ultrasound, Ionotophoresis 4mg /ml Dexamethasone, and Manual therapy  PLAN FOR NEXT SESSION: test balance, hip strength, functional strength, lumbar mobility, postural and core strength    Donnel Saxon Tarena Gockley, PTA 10/14/2023, 1:45 PM    Referring diagnosis? M54.42 (ICD-10-CM) - Left-sided low back pain with left-sided sciatica, unspecified chronicity Treatment diagnosis? (if different than referring diagnosis) M54.59 What was this (referring dx) caused by? []  Surgery [x]  Fall [x]  Ongoing issue []  Arthritis []  Other: ____________  Laterality: []   Rt []   Lt [x]  Both  Check all possible CPT codes:  *CHOOSE 10 OR LESS*    []  97110 (Therapeutic Exercise)  []  92507 (SLP Treatment)  []  97112 (Neuro Re-ed)   []  92526 (Swallowing Treatment)   a 16109 (Gait Training)   []  [phone] (Cognitive Training, 1st 15 minutes) []  97140 (Manual Therapy)   []  97130 (Cognitive Training, each add'l 15 minutes)  []  97164 (Re-evaluation)                              []  Other, List CPT Code ____________  []  97530 (Therapeutic Activities)     []  97535 (Self Care)   [x]  All codes above (97110 - 97535)  []  97012 (Mechanical Traction)  []  97014 (E-stim Unattended)  []  97032 (E-stim manual)  []  97033 (Ionto)  []  97035 (Ultrasound) []  09811 (Physical Performance Training) []  [id number] (Aquatic Therapy) []  97016 (Vasopneumatic Device) []  [id number] (Paraffin) []  97034 (Contrast Bath) []  97597 (Wound Care 1st 20 sq cm) []  97598 (Wound Care each add'l 20 sq cm) []  97760 (Orthotic Fabrication, Fitting, Training Initial) []  [id number] (Prosthetic Management and Training Initial) []  [id number] (Orthotic or Prosthetic Training/ Modification Subsequent)

## 2023-10-17 ENCOUNTER — Encounter (HOSPITAL_BASED_OUTPATIENT_CLINIC_OR_DEPARTMENT_OTHER): Payer: Medicare PPO | Admitting: Physical Therapy

## 2023-10-21 ENCOUNTER — Ambulatory Visit (HOSPITAL_BASED_OUTPATIENT_CLINIC_OR_DEPARTMENT_OTHER): Payer: Medicare PPO | Admitting: Physical Therapy

## 2023-10-21 ENCOUNTER — Encounter (HOSPITAL_BASED_OUTPATIENT_CLINIC_OR_DEPARTMENT_OTHER): Payer: Self-pay | Admitting: Physical Therapy

## 2023-10-21 DIAGNOSIS — R2689 Other abnormalities of gait and mobility: Secondary | ICD-10-CM

## 2023-10-21 DIAGNOSIS — R29898 Other symptoms and signs involving the musculoskeletal system: Secondary | ICD-10-CM | POA: Diagnosis not present

## 2023-10-21 DIAGNOSIS — M5459 Other low back pain: Secondary | ICD-10-CM

## 2023-10-21 DIAGNOSIS — M6281 Muscle weakness (generalized): Secondary | ICD-10-CM

## 2023-10-21 NOTE — Therapy (Signed)
OUTPATIENT PHYSICAL THERAPY TREATMENT      Patient Name: Joanne Mendoza MRN: [id number] DOB:1945-12-31, 78 y.o., female Today's Date: 10/21/2023    END OF SESSION:  PT End of Session - 10/21/23 1145     Visit Number 24    Number of Visits 44    Date for PT Re-Evaluation 11/18/23    Authorization Type [employer]    Authorization Time Period 10 visits approved  From 02.04.2025 - 03.01.2025    Authorization - Visit Number 4    Authorization - Number of Visits 10    Progress Note Due on Visit 31    PT Start Time 1145    PT Stop Time 1225    PT Time Calculation (min) 40 min    Activity Tolerance Patient tolerated treatment well    Behavior During Therapy WFL for tasks assessed/performed                   Past Medical History:  Diagnosis Date   Hyperlipidemia    Hypertension    Past Surgical History:  Procedure Laterality Date   broken arm     CESAREAN SECTION     x 2    Patient Active Problem List   Diagnosis Date Noted   Hyperlipidemia associated with type 2 diabetes mellitus (HCC) 10/02/2018   White coat hypertension 09/07/2013   Severe obesity (BMI >= 40) (HCC) 05/21/2013   Type 2 diabetes mellitus with hyperglycemia (HCC) 05/20/2013   Essential hypertension 12/12/2008   NONSPECIFIC ABN FINDING RAD & OTH EXAM GU ORGAN 12/12/2008    PCP: Kristian Covey, MD  REFERRING PROVIDER: Kristian Covey, MD  REFERRING DIAG: [phone] (ICD-10-CM) - Left-sided low back pain with left-sided sciatica, unspecified chronicity  Rationale for Evaluation and Treatment: Rehabilitation  THERAPY DIAG:  Other low back pain  Other abnormalities of gait and mobility  Other symptoms and signs involving the musculoskeletal system  Muscle weakness (generalized)  ONSET DATE: chronic  SUBJECTIVE:                                                                                                                                                                                            SUBJECTIVE STATEMENT: Pt reports overdid it a bit with being on her feet a lot.   EVAL: Patient states used to be more mobile but has decreased over last few years. Had a fall earlier this year when walking [place]. Some swelling and numbness in R knee but has been getting better. Some left leg issues as well. Patient states some back aches and pains. Sciatic symptoms  L hip/low back to L knee. Is afraid of falling so she limits activity which makes it worse, caught up in cycle.   PERTINENT HISTORY:  HTN, HLD, DM  PAIN:  Are you having pain? None at rest, 5-8/10 with activity.   PRECAUTIONS: None  WEIGHT BEARING RESTRICTIONS: No  FALLS:  Has patient fallen in last 6 months? Yes. Number of falls 1   PLOF: Independent  PATIENT GOALS: get more active again and feel better    OBJECTIVE: (objective measures from initial evaluation unless otherwise dated)  PATIENT SURVEYS:  FOTO 53% function 06/18/23: 53% function 12/9:54% 2/4:53%  SCREENING FOR RED FLAGS: Bowel or bladder incontinence: No Spinal tumors: No Cauda equina syndrome: No Compression fracture: No Abdominal aneurysm: No  COGNITION: Overall cognitive status: Within functional limits for tasks assessed     SENSATION: WFL   POSTURE: rounded shoulders, forward head, decreased lumbar lordosis, anterior pelvic tilt, and flexed trunk   PALPATION: Mild ttp lumbar paraspinals, L glutes  LUMBAR ROM:   AROM eval 12/9   Flexion 0% limited 0% limited   Extension 75% limited 50% limited 0% No pain   Right lateral flexion 25% limited 0% limited   Left lateral flexion 25% limited 0% limited   Right rotation     Left rotation      (Blank rows = not tested) * = pain/symptoms  LOWER EXTREMITY ROM:     Passive  Right eval Left eval R 2/4 L 2/4  Hip flexion      Hip extension      Hip abduction      Hip adduction      Hip internal rotation      Hip external rotation    30active 44passive, 30active  Knee flexion      Knee extension      Ankle dorsiflexion      Ankle plantarflexion      Ankle inversion      Ankle eversion       (Blank rows = not tested) * = pain/symptoms  LOWER EXTREMITY MMT:    MMT Right eval Left eval Right 05/14/23 Left  05/14/23 Right 10/16 Left 06/18/23 Right 12/09 Left 12/09 Right 2/4 Left 2/4  Hip flexion 40.5 39.1   55.4 54.1 39.9 37.1 64.5 59.4  Hip extension   4/5 4/5 4/5 4/5      Hip abduction 43.0 41.4 4-/5 4/5 72.9/ 4-/5 60.5/ 4-/5 43.7 36.5 43.1 25.7!  Hip adduction            Hip internal rotation            Hip external rotation            Knee flexion            Knee extension 73.3 66.9   82 68.3 26.7 43.3 49.7 43.5  Ankle dorsiflexion            Ankle plantarflexion            Ankle inversion            Ankle eversion             (Blank rows = not tested) * = pain/symptoms   FUNCTIONAL TESTS:  5 times sit to stand: 9.53 seconds without UE support, mild unsteadiness no loss of balance   12/9 9 seconds      GAIT: Distance walked: 80 feet Assistive device utilized: None Level of assistance: Complete Independence Comments: forward trunk, decreased  foot clearance, bilateral glute weakness  TODAY'S TREATMENT:                                                                                                                              DATE:  10/21/23 Bridge 1 x 10  Bridge with clam GTB 2 x 10  PPT with march GTB at feet 2 x 10 SLS on airex with UE support 3 x 30 second holds Hip hike on airex 1 x 8 Palof press with PPT GTB 2 x 10 Squat 2 x 10  Treatment                            10/14/2023: Blank lines following charge title = not provided on this treatment date.   Manual:  TPDN No  There-ex: LTR x20 S/l clams GTB 2x15 3sec hold Seated HSS 2x30sec each Standing hip abduction GTB 3x10ea There-Act: Bridges 3x10 Squats at rail 2x10 Self Care:  Nuro-Re-ed: Hip hike off 4" step  3x20bil Supine marching with GTB x30 Supine SLR 2x10ea Gait Training:     10/10/23 Bridge 1 x 10  Bridge with clam GTB 2 x 10  S/l clams GTB 2 x 15 Standing hip abduction GTB 2 x 10  Weighted retro gait 10# 1 x 10  10/07/23 Updated ROM and MMT Goal review FOTO:53% LTR x20 PPT with ab set 5" x10 Bridge  x 10 S/l clams GTB x20ea    09/23/23 LTR x20 PPT with ab set 5" 3x10 Bridge 2 x 10 Prone quad stretch 2x30sec ea  Standing PPT against wall 2 x 10  Standing hip abduction 2x10bil Hip hinge 2 x 10  Standing hip extension with PPT and glute set 2x10bil Standing hip flexor stretch at bottom stair  Gait in hall HS and ITB stretching supine bil  09/16/23 LTR x20 PPT with ab set 5" 3x10 Bridge 2 x 10 Seated PPT 2 x 10 with 5 second holds  Standing PPT against wall 3 x 10 3-5 second holds Standing PPT 2 x 10 Hip hinge 2 x 10  Standing hip extension with PPT and glute set 1 x 15 bilateral  09/10/23 LTR x20 PPT with ab set 5" 2x10 Sidelying clam 3" hold 2x15ea Hip hike off 4" step 3x20bil PPT against wall 3 seconds x 10 Gait in hall x2 laps Prone quad stretch with manual resistance bilaterally Prone hip extension x 10 bilaterally Instruction in [place] stretch with additional quad stretch.   09/04/23 Sidelying clam BTB at knees 3x10 Standing hip flexor stretch 5 x 20 second holds Hip hikes 4 inch step 3 x 20 bilateral PPT with ab set 3 x 10   12/30 STM and IASTM to R quads and hip flexors, R glutes LTR 5" x10ea Thomas stretching Sidelying clam GTB at knees 2x15  Standing hip flexion stretch (sliding LE posteriorly) Standing hip extension 2x10ea Hip hikes 4" step 3x20L -  gait in hall   08/20/23 LTR 5" x10ea Piriformis stretch 3 x 20 second holds Active hamstring stretch 5 x 10 second holds Sidelying clam GTB at knees 2 x 10  Standing hip flexor stretch at step 3 x 20 second holds Standing hip adductor stretch with over head reach 10 x 10 second  holds   PATIENT EDUCATION:  Education details: Patient educated on exam findings, POC, scope of PT, HEP. 05/14/23: HEP Person educated: Patient Education method: Programmer, multimedia, Demonstration, and Handouts Education comprehension: verbalized understanding, returned demonstration, verbal cues required, and tactile cues required  HOME EXERCISE PROGRAM: Access Code: [email or web address] URL: [email or web address]   ASSESSMENT:  CLINICAL IMPRESSION: Continued with hip and core strengthening which is tolerated well. Quick fatigue with hip hike on airex. Moderate fatigue at end of session. Patient will continue to benefit from physical therapy in order to improve function and reduce impairment.    OBJECTIVE IMPAIRMENTS: Abnormal gait, decreased activity tolerance, decreased balance, decreased endurance, decreased mobility, difficulty walking, decreased ROM, decreased strength, increased muscle spasms, impaired flexibility, improper body mechanics, postural dysfunction, and pain.   ACTIVITY LIMITATIONS: carrying, lifting, bending, standing, squatting, transfers, hygiene/grooming, locomotion level, and caring for others  PARTICIPATION LIMITATIONS: meal prep, cleaning, laundry, shopping, community activity, and yard work  PERSONAL FACTORS: Fitness, Time since onset of injury/illness/exacerbation, and 3+ comorbidities: HTN, HLD, DM, hx back pain  are also affecting patient's functional outcome.   REHAB POTENTIAL: Good  CLINICAL DECISION MAKING: Stable/uncomplicated  EVALUATION COMPLEXITY: Low   GOALS: Goals reviewed with patient? Yes  SHORT TERM GOALS: Target date: 05/28/2023    Patient will be independent with HEP in order to improve functional outcomes. Baseline:  Goal status: MET (9/30)  2.  Patient will report at least 25% improvement in symptoms for improved quality of life. Baseline:  Goal status: MET  3. Patient will increase left hip ER to full without pain  Goal status:  IN PROGRESS (30deg on 2/4)  4. Patient will increase right quad strength by 10 lbs  Goal status: IN PROGRESS   LONG TERM GOALS: Target date: 06/25/2023    Patient will report at least 75% improvement in symptoms for improved quality of life. Baseline:  Goal status: IN PROGRESS  2.  Patient will improve FOTO score by at least 9 points in order to indicate improved tolerance to activity. Baseline: 53% function 06/18/23: 53% function 53% 12/09 Goal status: INITIAL  3.  Patient will demonstrate at least 25% improvement in lumbar ROM in all restricted planes for improved ability to move trunk while completing chores. Baseline:  Goal status: MET  4.  Patient will report improved confidence with gait, balance, and strength in roder to return to general exercise routine. Baseline:  Goal status: improving 2/4  5.  Patient will demonstrate 5lb increase in muscle testing in all tested musculature as evidence of improved strength to assist with stair ambulation and gait.   Baseline: see above Goal status: MET     PLAN:  PT FREQUENCY: 2x/week  PT DURATION: 6 weeks  PLANNED INTERVENTIONS: Therapeutic exercises, Therapeutic activity, Neuromuscular re-education, Balance training, Gait training, Patient/Family education, Joint manipulation, Joint mobilization, Stair training, Orthotic/Fit training, DME instructions, Aquatic Therapy, Dry Needling, Electrical stimulation, Spinal manipulation, Spinal mobilization, Cryotherapy, Moist heat, Compression bandaging, scar mobilization, Splintting, Taping, Traction, Ultrasound, Ionotophoresis 4mg /ml Dexamethasone, and Manual therapy  PLAN FOR NEXT SESSION: test balance, hip strength, functional strength, lumbar mobility, postural and core strength    Wyman Songster, PT 10/21/2023, 12:25 PM  Referring diagnosis? M54.42 (ICD-10-CM) - Left-sided low back pain with left-sided sciatica, unspecified chronicity Treatment diagnosis? (if different  than referring diagnosis) M54.59 What was this (referring dx) caused by? []  Surgery [x]  Fall [x]  Ongoing issue []  Arthritis []  Other: ____________  Laterality: []  Rt []  Lt [x]  Both  Check all possible CPT codes:  *CHOOSE 10 OR LESS*    []  97110 (Therapeutic Exercise)  []  92507 (SLP Treatment)  []  97112 (Neuro Re-ed)   []  92526 (Swallowing Treatment)   a 42706 (Gait Training)   []  [id number] (Cognitive Training, 1st 15 minutes) []  97140 (Manual Therapy)   []  97130 (Cognitive Training, each add'l 15 minutes)  []  97164 (Re-evaluation)                              []  Other, List CPT Code ____________  []  97530 (Therapeutic Activities)     []  97535 (Self Care)   [x]  All codes above (97110 - 97535)  []  23762 (Mechanical Traction)  []  97014 (E-stim Unattended)  []  97032 (E-stim manual)  []  97033 (Ionto)  []  97035 (Ultrasound) []  97750 (Physical Performance Training) []  [id number] (Aquatic Therapy) []  97016 (Vasopneumatic Device) []  [id number] (Paraffin) []  97034 (Contrast Bath) []  97597 (Wound Care 1st 20 sq cm) []  97598 (Wound Care each add'l 20 sq cm) []  97760 (Orthotic Fabrication, Fitting, Training Initial) []  [id number] (Prosthetic Management and Training Initial) []  [id number] (Orthotic or Prosthetic Training/ Modification Subsequent)

## 2023-10-24 ENCOUNTER — Ambulatory Visit (HOSPITAL_BASED_OUTPATIENT_CLINIC_OR_DEPARTMENT_OTHER): Payer: Medicare PPO

## 2023-10-24 ENCOUNTER — Encounter (HOSPITAL_BASED_OUTPATIENT_CLINIC_OR_DEPARTMENT_OTHER): Payer: Self-pay

## 2023-10-24 DIAGNOSIS — M5459 Other low back pain: Secondary | ICD-10-CM

## 2023-10-24 DIAGNOSIS — M6281 Muscle weakness (generalized): Secondary | ICD-10-CM | POA: Diagnosis not present

## 2023-10-24 DIAGNOSIS — R29898 Other symptoms and signs involving the musculoskeletal system: Secondary | ICD-10-CM | POA: Diagnosis not present

## 2023-10-24 DIAGNOSIS — R2689 Other abnormalities of gait and mobility: Secondary | ICD-10-CM | POA: Diagnosis not present

## 2023-10-24 NOTE — Therapy (Signed)
OUTPATIENT PHYSICAL THERAPY TREATMENT      Patient Name: Joanne Mendoza MRN: [id number] DOB:03-19-1946, 78 y.o., female Today's Date: 10/24/2023    END OF SESSION:  PT End of Session - 10/24/23 1314     Visit Number 25    Number of Visits 44    Date for PT Re-Evaluation 11/18/23    Authorization Type [employer]    Authorization Time Period 10 visits approved  From 02.04.2025 - 03.01.2025    Authorization - Visit Number 5    Authorization - Number of Visits 10    Progress Note Due on Visit 31    PT Start Time 1301    PT Stop Time 1345    PT Time Calculation (min) 44 min    Activity Tolerance Patient tolerated treatment well    Behavior During Therapy WFL for tasks assessed/performed                    Past Medical History:  Diagnosis Date   Hyperlipidemia    Hypertension    Past Surgical History:  Procedure Laterality Date   broken arm     CESAREAN SECTION     x 2    Patient Active Problem List   Diagnosis Date Noted   Hyperlipidemia associated with type 2 diabetes mellitus (HCC) 10/02/2018   White coat hypertension 09/07/2013   Severe obesity (BMI >= 40) (HCC) 05/21/2013   Type 2 diabetes mellitus with hyperglycemia (HCC) 05/20/2013   Essential hypertension 12/12/2008   NONSPECIFIC ABN FINDING RAD & OTH EXAM GU ORGAN 12/12/2008    PCP: Kristian Covey, MD  REFERRING PROVIDER: Kristian Covey, MD  REFERRING DIAG: [phone] (ICD-10-CM) - Left-sided low back pain with left-sided sciatica, unspecified chronicity  Rationale for Evaluation and Treatment: Rehabilitation  THERAPY DIAG:  Other abnormalities of gait and mobility  Muscle weakness (generalized)  Other symptoms and signs involving the musculoskeletal system  Other low back pain  ONSET DATE: chronic  SUBJECTIVE:                                                                                                                                                                                            SUBJECTIVE STATEMENT: Pt reports overall increase in hip discomfort over the last couple months. Has appointment with ortho MD in April.  EVAL: Patient states used to be more mobile but has decreased over last few years. Had a fall earlier this year when walking [place]. Some swelling and numbness in R knee but has been getting better. Some left leg issues as well. Patient states some  back aches and pains. Sciatic symptoms L hip/low back to L knee. Is afraid of falling so she limits activity which makes it worse, caught up in cycle.   PERTINENT HISTORY:  HTN, HLD, DM  PAIN:  Are you having pain? None at rest, 5-8/10 with activity.   PRECAUTIONS: None  WEIGHT BEARING RESTRICTIONS: No  FALLS:  Has patient fallen in last 6 months? Yes. Number of falls 1   PLOF: Independent  PATIENT GOALS: get more active again and feel better    OBJECTIVE: (objective measures from initial evaluation unless otherwise dated)  PATIENT SURVEYS:  FOTO 53% function 06/18/23: 53% function 12/9:54% 2/4:53%  SCREENING FOR RED FLAGS: Bowel or bladder incontinence: No Spinal tumors: No Cauda equina syndrome: No Compression fracture: No Abdominal aneurysm: No  COGNITION: Overall cognitive status: Within functional limits for tasks assessed     SENSATION: WFL   POSTURE: rounded shoulders, forward head, decreased lumbar lordosis, anterior pelvic tilt, and flexed trunk   PALPATION: Mild ttp lumbar paraspinals, L glutes  LUMBAR ROM:   AROM eval 12/9   Flexion 0% limited 0% limited   Extension 75% limited 50% limited 0% No pain   Right lateral flexion 25% limited 0% limited   Left lateral flexion 25% limited 0% limited   Right rotation     Left rotation      (Blank rows = not tested) * = pain/symptoms  LOWER EXTREMITY ROM:     Passive  Right eval Left eval R 2/4 L 2/4  Hip flexion      Hip extension      Hip abduction      Hip adduction       Hip internal rotation      Hip external rotation   30active 44passive, 30active  Knee flexion      Knee extension      Ankle dorsiflexion      Ankle plantarflexion      Ankle inversion      Ankle eversion       (Blank rows = not tested) * = pain/symptoms  LOWER EXTREMITY MMT:    MMT Right eval Left eval Right 05/14/23 Left  05/14/23 Right 10/16 Left 06/18/23 Right 12/09 Left 12/09 Right 2/4 Left 2/4  Hip flexion 40.5 39.1   55.4 54.1 39.9 37.1 64.5 59.4  Hip extension   4/5 4/5 4/5 4/5      Hip abduction 43.0 41.4 4-/5 4/5 72.9/ 4-/5 60.5/ 4-/5 43.7 36.5 43.1 25.7!  Hip adduction            Hip internal rotation            Hip external rotation            Knee flexion            Knee extension 73.3 66.9   82 68.3 26.7 43.3 49.7 43.5  Ankle dorsiflexion            Ankle plantarflexion            Ankle inversion            Ankle eversion             (Blank rows = not tested) * = pain/symptoms   FUNCTIONAL TESTS:  5 times sit to stand: 9.53 seconds without UE support, mild unsteadiness no loss of balance   12/9 9 seconds      GAIT: Distance walked: 80 feet Assistive device utilized: None Level of assistance:  Complete Independence Comments: forward trunk, decreased foot clearance, bilateral glute weakness  TODAY'S TREATMENT:                                                                                                                              DATE:   10/24/2023: Blank lines following charge title = not provided on this treatment date.   Manual:  TPDN No  There-ex: LTR x20 S/l clams GTB 2x15 3sec hold Seated HSS 2x30sec each Standing hip abduction GTB 3x10ea There-Act: Bridges 3x10 Squats at rail 3x10 Nuro-Re-ed: Hip hike off 4" step 2x20bil Supine marching with GTB x30 Supine SLR 2x10ea   10/21/23 Bridge 1 x 10  Bridge with clam GTB 2 x 10  PPT with march GTB at feet 2 x 10 SLS on airex with UE support 3 x 30 second holds Hip hike on  airex 1 x 8 Palof press with PPT GTB 2 x 10 Squat 2 x 10  Treatment                            10/14/2023: Blank lines following charge title = not provided on this treatment date.   Manual:  TPDN No  There-ex: LTR x20 S/l clams GTB 2x15 3sec hold Seated HSS 2x30sec each Standing hip abduction GTB 3x10ea There-Act: Bridges 3x10 Squats at rail 2x10 Self Care:  Nuro-Re-ed: Hip hike off 4" step 3x20bil Supine marching with GTB x30 Supine SLR 2x10ea Gait Training:     10/10/23 Bridge 1 x 10  Bridge with clam GTB 2 x 10  S/l clams GTB 2 x 15 Standing hip abduction GTB 2 x 10  Weighted retro gait 10# 1 x 10  10/07/23 Updated ROM and MMT Goal review FOTO:53% LTR x20 PPT with ab set 5" x10 Bridge  x 10 S/l clams GTB x20ea    09/23/23 LTR x20 PPT with ab set 5" 3x10 Bridge 2 x 10 Prone quad stretch 2x30sec ea  Standing PPT against wall 2 x 10  Standing hip abduction 2x10bil Hip hinge 2 x 10  Standing hip extension with PPT and glute set 2x10bil Standing hip flexor stretch at bottom stair  Gait in hall HS and ITB stretching supine bil  09/16/23 LTR x20 PPT with ab set 5" 3x10 Bridge 2 x 10 Seated PPT 2 x 10 with 5 second holds  Standing PPT against wall 3 x 10 3-5 second holds Standing PPT 2 x 10 Hip hinge 2 x 10  Standing hip extension with PPT and glute set 1 x 15 bilateral  09/10/23 LTR x20 PPT with ab set 5" 2x10 Sidelying clam 3" hold 2x15ea Hip hike off 4" step 3x20bil PPT against wall 3 seconds x 10 Gait in hall x2 laps Prone quad stretch with manual resistance bilaterally Prone hip extension x 10 bilaterally Instruction in [place] stretch with additional quad stretch.   09/04/23 Sidelying clam BTB at  knees 3x10 Standing hip flexor stretch 5 x 20 second holds Hip hikes 4 inch step 3 x 20 bilateral PPT with ab set 3 x 10   12/30 STM and IASTM to R quads and hip flexors, R glutes LTR 5" x10ea Thomas stretching Sidelying clam GTB at knees  2x15  Standing hip flexion stretch (sliding LE posteriorly) Standing hip extension 2x10ea Hip hikes 4" step 3x20L -gait in hall   08/20/23 LTR 5" x10ea Piriformis stretch 3 x 20 second holds Active hamstring stretch 5 x 10 second holds Sidelying clam GTB at knees 2 x 10  Standing hip flexor stretch at step 3 x 20 second holds Standing hip adductor stretch with over head reach 10 x 10 second holds   PATIENT EDUCATION:  Education details: Patient educated on exam findings, POC, scope of PT, HEP. 05/14/23: HEP Person educated: Patient Education method: Programmer, multimedia, Demonstration, and Handouts Education comprehension: verbalized understanding, returned demonstration, verbal cues required, and tactile cues required  HOME EXERCISE PROGRAM: Access Code: [email or web address] URL: [email or web address]   ASSESSMENT:  CLINICAL IMPRESSION: Good tolerance for exercises today without c/o increased pain. Difficulty with supine SLR and unable to maintain full knee extension. Will monitor her pain level and soreness following PT. Will continue to progress as tolerated.    OBJECTIVE IMPAIRMENTS: Abnormal gait, decreased activity tolerance, decreased balance, decreased endurance, decreased mobility, difficulty walking, decreased ROM, decreased strength, increased muscle spasms, impaired flexibility, improper body mechanics, postural dysfunction, and pain.   ACTIVITY LIMITATIONS: carrying, lifting, bending, standing, squatting, transfers, hygiene/grooming, locomotion level, and caring for others  PARTICIPATION LIMITATIONS: meal prep, cleaning, laundry, shopping, community activity, and yard work  PERSONAL FACTORS: Fitness, Time since onset of injury/illness/exacerbation, and 3+ comorbidities: HTN, HLD, DM, hx back pain  are also affecting patient's functional outcome.   REHAB POTENTIAL: Good  CLINICAL DECISION MAKING: Stable/uncomplicated  EVALUATION COMPLEXITY: Low   GOALS: Goals  reviewed with patient? Yes  SHORT TERM GOALS: Target date: 05/28/2023    Patient will be independent with HEP in order to improve functional outcomes. Baseline:  Goal status: MET (9/30)  2.  Patient will report at least 25% improvement in symptoms for improved quality of life. Baseline:  Goal status: MET  3. Patient will increase left hip ER to full without pain  Goal status: IN PROGRESS (30deg on 2/4)  4. Patient will increase right quad strength by 10 lbs  Goal status: IN PROGRESS   LONG TERM GOALS: Target date: 06/25/2023    Patient will report at least 75% improvement in symptoms for improved quality of life. Baseline:  Goal status: IN PROGRESS  2.  Patient will improve FOTO score by at least 9 points in order to indicate improved tolerance to activity. Baseline: 53% function 06/18/23: 53% function 53% 12/09 Goal status: INITIAL  3.  Patient will demonstrate at least 25% improvement in lumbar ROM in all restricted planes for improved ability to move trunk while completing chores. Baseline:  Goal status: MET  4.  Patient will report improved confidence with gait, balance, and strength in roder to return to general exercise routine. Baseline:  Goal status: improving 2/4  5.  Patient will demonstrate 5lb increase in muscle testing in all tested musculature as evidence of improved strength to assist with stair ambulation and gait.   Baseline: see above Goal status: MET     PLAN:  PT FREQUENCY: 2x/week  PT DURATION: 6 weeks  PLANNED INTERVENTIONS: Therapeutic exercises, Therapeutic activity, Neuromuscular re-education, Balance training, Gait training, Patient/Family  education, Joint manipulation, Joint mobilization, Stair training, Orthotic/Fit training, DME instructions, Aquatic Therapy, Dry Needling, Electrical stimulation, Spinal manipulation, Spinal mobilization, Cryotherapy, Moist heat, Compression bandaging, scar mobilization, Splintting, Taping, Traction,  Ultrasound, Ionotophoresis 4mg /ml Dexamethasone, and Manual therapy  PLAN FOR NEXT SESSION: test balance, hip strength, functional strength, lumbar mobility, postural and core strength    Donnel Saxon Jamesa Tedrick, PTA 10/24/2023, 2:01 PM    Referring diagnosis? M54.42 (ICD-10-CM) - Left-sided low back pain with left-sided sciatica, unspecified chronicity Treatment diagnosis? (if different than referring diagnosis) M54.59 What was this (referring dx) caused by? []  Surgery [x]  Fall [x]  Ongoing issue []  Arthritis []  Other: ____________  Laterality: []  Rt []  Lt [x]  Both  Check all possible CPT codes:  *CHOOSE 10 OR LESS*    []  97110 (Therapeutic Exercise)  []  92507 (SLP Treatment)  []  97112 (Neuro Re-ed)   []  92526 (Swallowing Treatment)   a 54098 (Gait Training)   []  [id number] (Cognitive Training, 1st 15 minutes) []  97140 (Manual Therapy)   []  97130 (Cognitive Training, each add'l 15 minutes)  []  97164 (Re-evaluation)                              []  Other, List CPT Code ____________  []  97530 (Therapeutic Activities)     []  97535 (Self Care)   [x]  All codes above (97110 - 97535)  []  97012 (Mechanical Traction)  []  97014 (E-stim Unattended)  []  11914 (E-stim manual)  []  97033 (Ionto)  []  97035 (Ultrasound) []  97750 (Physical Performance Training) []  [id number] (Aquatic Therapy) []  97016 (Vasopneumatic Device) []  [id number] (Paraffin) []  97034 (Contrast Bath) []  97597 (Wound Care 1st 20 sq cm) []  97598 (Wound Care each add'l 20 sq cm) []  97760 (Orthotic Fabrication, Fitting, Training Initial) []  [id number] (Prosthetic Management and Training Initial) []  [id number] (Orthotic or Prosthetic Training/ Modification Subsequent)

## 2023-10-28 ENCOUNTER — Encounter (HOSPITAL_BASED_OUTPATIENT_CLINIC_OR_DEPARTMENT_OTHER): Payer: Self-pay

## 2023-10-28 ENCOUNTER — Ambulatory Visit (HOSPITAL_BASED_OUTPATIENT_CLINIC_OR_DEPARTMENT_OTHER): Payer: Medicare PPO

## 2023-10-28 DIAGNOSIS — R2689 Other abnormalities of gait and mobility: Secondary | ICD-10-CM

## 2023-10-28 DIAGNOSIS — R29898 Other symptoms and signs involving the musculoskeletal system: Secondary | ICD-10-CM

## 2023-10-28 DIAGNOSIS — M5459 Other low back pain: Secondary | ICD-10-CM | POA: Diagnosis not present

## 2023-10-28 DIAGNOSIS — M6281 Muscle weakness (generalized): Secondary | ICD-10-CM

## 2023-10-28 NOTE — Therapy (Signed)
 OUTPATIENT PHYSICAL THERAPY TREATMENT      Patient Name: Joanne Mendoza MRN: [id number] DOB:1946-06-12, 78 y.o., female Today's Date: 10/28/2023    END OF SESSION:  PT End of Session - 10/28/23 1320     Visit Number 26    Number of Visits 44    Date for PT Re-Evaluation 11/18/23    Authorization Type [employer]    Authorization Time Period 10 visits approved  From 02.04.2025 - 03.01.2025    Authorization - Visit Number 6    Authorization - Number of Visits 10    Progress Note Due on Visit 31    PT Start Time 1303    PT Stop Time 1345    PT Time Calculation (min) 42 min    Activity Tolerance Patient tolerated treatment well    Behavior During Therapy WFL for tasks assessed/performed                     Past Medical History:  Diagnosis Date   Hyperlipidemia    Hypertension    Past Surgical History:  Procedure Laterality Date   broken arm     CESAREAN SECTION     x 2    Patient Active Problem List   Diagnosis Date Noted   Hyperlipidemia associated with type 2 diabetes mellitus (HCC) 10/02/2018   White coat hypertension 09/07/2013   Severe obesity (BMI >= 40) (HCC) 05/21/2013   Type 2 diabetes mellitus with hyperglycemia (HCC) 05/20/2013   Essential hypertension 12/12/2008   NONSPECIFIC ABN FINDING RAD & OTH EXAM GU ORGAN 12/12/2008    PCP: Kristian Covey, MD  REFERRING PROVIDER: Kristian Covey, MD  REFERRING DIAG: [phone] (ICD-10-CM) - Left-sided low back pain with left-sided sciatica, unspecified chronicity  Rationale for Evaluation and Treatment: Rehabilitation  THERAPY DIAG:  Other abnormalities of gait and mobility  Muscle weakness (generalized)  Other symptoms and signs involving the musculoskeletal system  Other low back pain  ONSET DATE: chronic  SUBJECTIVE:                                                                                                                                                                                            SUBJECTIVE STATEMENT: Reports minimal change in pain level overall, but does note improvement in strength and balance. Has appointment with ortho MD in April.  EVAL: Patient states used to be more mobile but has decreased over last few years. Had a fall earlier this year when walking [place] at [place]. Some swelling and numbness in R knee but has been getting better. Some left leg issues as  well. Patient states some back aches and pains. Sciatic symptoms L hip/low back to L knee. Is afraid of falling so she limits activity which makes it worse, caught up in cycle.   PERTINENT HISTORY:  HTN, HLD, DM  PAIN:  Are you having pain? None at rest, 5-8/10 with activity.   PRECAUTIONS: None  WEIGHT BEARING RESTRICTIONS: No  FALLS:  Has patient fallen in last 6 months? Yes. Number of falls 1   PLOF: Independent  PATIENT GOALS: get more active again and feel better    OBJECTIVE: (objective measures from initial evaluation unless otherwise dated)  PATIENT SURVEYS:  FOTO 53% function 06/18/23: 53% function 12/9:54% 2/4:53%  SCREENING FOR RED FLAGS: Bowel or bladder incontinence: No Spinal tumors: No Cauda equina syndrome: No Compression fracture: No Abdominal aneurysm: No  COGNITION: Overall cognitive status: Within functional limits for tasks assessed     SENSATION: WFL   POSTURE: rounded shoulders, forward head, decreased lumbar lordosis, anterior pelvic tilt, and flexed trunk   PALPATION: Mild ttp lumbar paraspinals, L glutes  LUMBAR ROM:   AROM eval 12/9   Flexion 0% limited 0% limited   Extension 75% limited 50% limited 0% No pain   Right lateral flexion 25% limited 0% limited   Left lateral flexion 25% limited 0% limited   Right rotation     Left rotation      (Blank rows = not tested) * = pain/symptoms  LOWER EXTREMITY ROM:     Passive  Right eval Left eval R 2/4 L 2/4  Hip flexion      Hip extension      Hip abduction       Hip adduction      Hip internal rotation      Hip external rotation   30active 44passive, 30active  Knee flexion      Knee extension      Ankle dorsiflexion      Ankle plantarflexion      Ankle inversion      Ankle eversion       (Blank rows = not tested) * = pain/symptoms  LOWER EXTREMITY MMT:    MMT Right eval Left eval Right 05/14/23 Left  05/14/23 Right 10/16 Left 06/18/23 Right 12/09 Left 12/09 Right 2/4 Left 2/4  Hip flexion 40.5 39.1   55.4 54.1 39.9 37.1 64.5 59.4  Hip extension   4/5 4/5 4/5 4/5      Hip abduction 43.0 41.4 4-/5 4/5 72.9/ 4-/5 60.5/ 4-/5 43.7 36.5 43.1 25.7!  Hip adduction            Hip internal rotation            Hip external rotation            Knee flexion            Knee extension 73.3 66.9   82 68.3 26.7 43.3 49.7 43.5  Ankle dorsiflexion            Ankle plantarflexion            Ankle inversion            Ankle eversion             (Blank rows = not tested) * = pain/symptoms   FUNCTIONAL TESTS:  5 times sit to stand: 9.53 seconds without UE support, mild unsteadiness no loss of balance   12/9 9 seconds      GAIT: Distance walked: 80 feet Assistive device utilized:  None Level of assistance: Complete Independence Comments: forward trunk, decreased foot clearance, bilateral glute weakness  TODAY'S TREATMENT:                                                                                                                              DATE:    10/28/2023: Blank lines following charge title = not provided on this treatment date.   Manual:  TPDN No  There-ex: LTR x20 S/l clams GTB 2x15 3sec hold Standing hip abduction GTB 3x10ea There-Act: Bridges 3x10 Squats at rail 3x10 Nuro-Re-ed: Hip hike off 4" step 2x20bil Supine marching with GTB x30 Supine SLR 2x10ea, then 1x5 LAQ 4# 5" hold 3x10   10/24/2023: Blank lines following charge title = not provided on this treatment date.   Manual:  TPDN  No  There-ex: LTR x20 S/l clams GTB 2x15 3sec hold Seated HSS 2x30sec each Standing hip abduction GTB 3x10ea There-Act: Bridges 3x10 Squats at rail 3x10 Nuro-Re-ed: Hip hike off 4" step 2x20bil Supine marching with GTB x30 Supine SLR 2x10ea   10/21/23 Bridge 1 x 10  Bridge with clam GTB 2 x 10  PPT with march GTB at feet 2 x 10 SLS on airex with UE support 3 x 30 second holds Hip hike on airex 1 x 8 Palof press with PPT GTB 2 x 10 Squat 2 x 10  Treatment                            10/14/2023: Blank lines following charge title = not provided on this treatment date.   Manual:  TPDN No  There-ex: LTR x20 S/l clams GTB 2x15 3sec hold Seated HSS 2x30sec each Standing hip abduction GTB 3x10ea There-Act: Bridges 3x10 Squats at rail 2x10 Self Care:  Nuro-Re-ed: Hip hike off 4" step 3x20bil Supine marching with GTB x30 Supine SLR 2x10ea Gait Training:     10/10/23 Bridge 1 x 10  Bridge with clam GTB 2 x 10  S/l clams GTB 2 x 15 Standing hip abduction GTB 2 x 10  Weighted retro gait 10# 1 x 10   PATIENT EDUCATION:  Education details: Patient educated on exam findings, POC, scope of PT, HEP. 05/14/23: HEP Person educated: Patient Education method: Programmer, multimedia, Demonstration, and Handouts Education comprehension: verbalized understanding, returned demonstration, verbal cues required, and tactile cues required  HOME EXERCISE PROGRAM: Access Code: [email or web address] URL: [email or web address]   ASSESSMENT:  CLINICAL IMPRESSION: Pt remains challenged by supine SLR. Some discomfort in L anterior hip present with this after first 2 sets. Good tolerance for other hip strengthening interventions. Improving posture with standing tasks. Will continue working on hip strengthening as tolerated to improve function and decrease pain.    OBJECTIVE IMPAIRMENTS: Abnormal gait, decreased activity tolerance, decreased balance, decreased endurance, decreased mobility,  difficulty walking, decreased ROM, decreased strength, increased muscle spasms, impaired flexibility, improper body mechanics, postural dysfunction, and pain.   ACTIVITY  LIMITATIONS: carrying, lifting, bending, standing, squatting, transfers, hygiene/grooming, locomotion level, and caring for others  PARTICIPATION LIMITATIONS: meal prep, cleaning, laundry, shopping, community activity, and yard work  PERSONAL FACTORS: Fitness, Time since onset of injury/illness/exacerbation, and 3+ comorbidities: HTN, HLD, DM, hx back pain  are also affecting patient's functional outcome.   REHAB POTENTIAL: Good  CLINICAL DECISION MAKING: Stable/uncomplicated  EVALUATION COMPLEXITY: Low   GOALS: Goals reviewed with patient? Yes  SHORT TERM GOALS: Target date: 05/28/2023    Patient will be independent with HEP in order to improve functional outcomes. Baseline:  Goal status: MET (9/30)  2.  Patient will report at least 25% improvement in symptoms for improved quality of life. Baseline:  Goal status: MET  3. Patient will increase left hip ER to full without pain  Goal status: IN PROGRESS (30deg on 2/4)  4. Patient will increase right quad strength by 10 lbs  Goal status: IN PROGRESS   LONG TERM GOALS: Target date: 06/25/2023    Patient will report at least 75% improvement in symptoms for improved quality of life. Baseline:  Goal status: IN PROGRESS  2.  Patient will improve FOTO score by at least 9 points in order to indicate improved tolerance to activity. Baseline: 53% function 06/18/23: 53% function 53% 12/09 Goal status: INITIAL  3.  Patient will demonstrate at least 25% improvement in lumbar ROM in all restricted planes for improved ability to move trunk while completing chores. Baseline:  Goal status: MET  4.  Patient will report improved confidence with gait, balance, and strength in roder to return to general exercise routine. Baseline:  Goal status: improving 2/4  5.   Patient will demonstrate 5lb increase in muscle testing in all tested musculature as evidence of improved strength to assist with stair ambulation and gait.   Baseline: see above Goal status: MET     PLAN:  PT FREQUENCY: 2x/week  PT DURATION: 6 weeks  PLANNED INTERVENTIONS: Therapeutic exercises, Therapeutic activity, Neuromuscular re-education, Balance training, Gait training, Patient/Family education, Joint manipulation, Joint mobilization, Stair training, Orthotic/Fit training, DME instructions, Aquatic Therapy, Dry Needling, Electrical stimulation, Spinal manipulation, Spinal mobilization, Cryotherapy, Moist heat, Compression bandaging, scar mobilization, Splintting, Taping, Traction, Ultrasound, Ionotophoresis 4mg /ml Dexamethasone, and Manual therapy  PLAN FOR NEXT SESSION: test balance, hip strength, functional strength, lumbar mobility, postural and core strength    Donnel Saxon Tajai Suder, PTA 10/28/2023, 2:56 PM    Referring diagnosis? M54.42 (ICD-10-CM) - Left-sided low back pain with left-sided sciatica, unspecified chronicity Treatment diagnosis? (if different than referring diagnosis) M54.59 What was this (referring dx) caused by? []  Surgery [x]  Fall [x]  Ongoing issue []  Arthritis []  Other: ____________  Laterality: []  Rt []  Lt [x]  Both  Check all possible CPT codes:  *CHOOSE 10 OR LESS*    []  97110 (Therapeutic Exercise)  []  92507 (SLP Treatment)  []  97112 (Neuro Re-ed)   []  92526 (Swallowing Treatment)   a 81191 (Gait Training)   []  [id number] (Cognitive Training, 1st 15 minutes) []  97140 (Manual Therapy)   []  97130 (Cognitive Training, each add'l 15 minutes)  []  97164 (Re-evaluation)                              []  Other, List CPT Code ____________  []  97530 (Therapeutic Activities)     []  97535 (Self Care)   [x]  All codes above (97110 - 97535)  []  97012 (Mechanical Traction)  []  97014 (E-stim Unattended)  []  97032 (E-stim  manual)  []  97033 (Ionto)  []  97035  (Ultrasound) []  97750 (Physical Performance Training) []  [id number] (Aquatic Therapy) []  16109 (Vasopneumatic Device) []  [id number] (Paraffin) []  97034 (Contrast Bath) []  [phone] (Wound Care 1st 20 sq cm) []  97598 (Wound Care each add'l 20 sq cm) []  97760 (Orthotic Fabrication, Fitting, Training Initial) []  [id number] (Prosthetic Management and Training Initial) []  [id number] (Orthotic or Prosthetic Training/ Modification Subsequent)

## 2023-10-31 ENCOUNTER — Encounter (HOSPITAL_BASED_OUTPATIENT_CLINIC_OR_DEPARTMENT_OTHER): Payer: Self-pay | Admitting: Physical Therapy

## 2023-10-31 ENCOUNTER — Ambulatory Visit (HOSPITAL_BASED_OUTPATIENT_CLINIC_OR_DEPARTMENT_OTHER): Payer: Medicare PPO | Admitting: Physical Therapy

## 2023-10-31 DIAGNOSIS — R29898 Other symptoms and signs involving the musculoskeletal system: Secondary | ICD-10-CM | POA: Diagnosis not present

## 2023-10-31 DIAGNOSIS — M5459 Other low back pain: Secondary | ICD-10-CM

## 2023-10-31 DIAGNOSIS — R2689 Other abnormalities of gait and mobility: Secondary | ICD-10-CM | POA: Diagnosis not present

## 2023-10-31 DIAGNOSIS — M6281 Muscle weakness (generalized): Secondary | ICD-10-CM

## 2023-10-31 NOTE — Therapy (Signed)
 OUTPATIENT PHYSICAL THERAPY TREATMENT      Patient Name: Joanne Mendoza MRN: [id number] DOB:10/19/1945, 78 y.o., female Today's Date: 10/31/2023    END OF SESSION:  PT End of Session - 10/31/23 1307     Visit Number 27    Number of Visits 44    Date for PT Re-Evaluation 11/18/23    Authorization Type [employer]    Authorization Time Period 10 visits approved  From 02.04.2025 - 03.01.2025    Authorization - Visit Number 7    Authorization - Number of Visits 10    Progress Note Due on Visit 31    PT Start Time 1305    PT Stop Time 1344    PT Time Calculation (min) 39 min    Activity Tolerance Patient tolerated treatment well    Behavior During Therapy WFL for tasks assessed/performed               Past Medical History:  Diagnosis Date   Hyperlipidemia    Hypertension    Past Surgical History:  Procedure Laterality Date   broken arm     CESAREAN SECTION     x 2    Patient Active Problem List   Diagnosis Date Noted   Hyperlipidemia associated with type 2 diabetes mellitus (HCC) 10/02/2018   White coat hypertension 09/07/2013   Severe obesity (BMI >= 40) (HCC) 05/21/2013   Type 2 diabetes mellitus with hyperglycemia (HCC) 05/20/2013   Essential hypertension 12/12/2008   NONSPECIFIC ABN FINDING RAD & OTH EXAM GU ORGAN 12/12/2008    PCP: Kristian Covey, MD  REFERRING PROVIDER: Kristian Covey, MD  REFERRING DIAG: [phone] (ICD-10-CM) - Left-sided low back pain with left-sided sciatica, unspecified chronicity  Rationale for Evaluation and Treatment: Rehabilitation  THERAPY DIAG:  Other abnormalities of gait and mobility  Muscle weakness (generalized)  Other symptoms and signs involving the musculoskeletal system  Other low back pain  ONSET DATE: chronic  SUBJECTIVE:                                                                                                                                                                                            SUBJECTIVE STATEMENT: Patient states hip is feeling about the same.   EVAL: Patient states used to be more mobile but has decreased over last few years. Had a fall earlier this year when walking [place]. Some swelling and numbness in R knee but has been getting better. Some left leg issues as well. Patient states some back aches and pains. Sciatic symptoms L hip/low back to L knee. Is afraid of  falling so she limits activity which makes it worse, caught up in cycle.   PERTINENT HISTORY:  HTN, HLD, DM  PAIN:  Are you having pain? None at rest, 5-8/10 with activity.   PRECAUTIONS: None  WEIGHT BEARING RESTRICTIONS: No  FALLS:  Has patient fallen in last 6 months? Yes. Number of falls 1   PLOF: Independent  PATIENT GOALS: get more active again and feel better    OBJECTIVE: (objective measures from initial evaluation unless otherwise dated)  PATIENT SURVEYS:  FOTO 53% function 06/18/23: 53% function 12/9:54% 2/4:53%  SCREENING FOR RED FLAGS: Bowel or bladder incontinence: No Spinal tumors: No Cauda equina syndrome: No Compression fracture: No Abdominal aneurysm: No  COGNITION: Overall cognitive status: Within functional limits for tasks assessed     SENSATION: WFL   POSTURE: rounded shoulders, forward head, decreased lumbar lordosis, anterior pelvic tilt, and flexed trunk   PALPATION: Mild ttp lumbar paraspinals, L glutes  LUMBAR ROM:   AROM eval 12/9   Flexion 0% limited 0% limited   Extension 75% limited 50% limited 0% No pain   Right lateral flexion 25% limited 0% limited   Left lateral flexion 25% limited 0% limited   Right rotation     Left rotation      (Blank rows = not tested) * = pain/symptoms  LOWER EXTREMITY ROM:     Passive  Right eval Left eval R 2/4 L 2/4  Hip flexion      Hip extension      Hip abduction      Hip adduction      Hip internal rotation      Hip external rotation   30active 44passive,  30active  Knee flexion      Knee extension      Ankle dorsiflexion      Ankle plantarflexion      Ankle inversion      Ankle eversion       (Blank rows = not tested) * = pain/symptoms  LOWER EXTREMITY MMT:    MMT Right eval Left eval Right 05/14/23 Left  05/14/23 Right 10/16 Left 06/18/23 Right 12/09 Left 12/09 Right 2/4 Left 2/4  Hip flexion 40.5 39.1   55.4 54.1 39.9 37.1 64.5 59.4  Hip extension   4/5 4/5 4/5 4/5      Hip abduction 43.0 41.4 4-/5 4/5 72.9/ 4-/5 60.5/ 4-/5 43.7 36.5 43.1 25.7!  Hip adduction            Hip internal rotation            Hip external rotation            Knee flexion            Knee extension 73.3 66.9   82 68.3 26.7 43.3 49.7 43.5  Ankle dorsiflexion            Ankle plantarflexion            Ankle inversion            Ankle eversion             (Blank rows = not tested) * = pain/symptoms   FUNCTIONAL TESTS:  5 times sit to stand: 9.53 seconds without UE support, mild unsteadiness no loss of balance   12/9 9 seconds      GAIT: Distance walked: 80 feet Assistive device utilized: None Level of assistance: Complete Independence Comments: forward trunk, decreased foot clearance, bilateral glute weakness  TODAY'S TREATMENT:  DATE:  10/31/23 Leg press 10# 2 x 10 Hamstring curl machine 10# 2 x 10 Supine hamstring isometrics 20 x 10 second holds Supine active hamstring stretch 10 x 5-10 second holds Standing hip hike 2 x 10  10/28/2023: Blank lines following charge title = not provided on this treatment date.   Manual:  TPDN No  There-ex: LTR x20 S/l clams GTB 2x15 3sec hold Standing hip abduction GTB 3x10ea There-Act: Bridges 3x10 Squats at rail 3x10 Nuro-Re-ed: Hip hike off 4" step 2x20bil Supine marching with GTB x30 Supine SLR 2x10ea, then 1x5 LAQ 4# 5" hold 3x10   10/24/2023: Blank  lines following charge title = not provided on this treatment date.   Manual:  TPDN No  There-ex: LTR x20 S/l clams GTB 2x15 3sec hold Seated HSS 2x30sec each Standing hip abduction GTB 3x10ea There-Act: Bridges 3x10 Squats at rail 3x10 Nuro-Re-ed: Hip hike off 4" step 2x20bil Supine marching with GTB x30 Supine SLR 2x10ea   10/21/23 Bridge 1 x 10  Bridge with clam GTB 2 x 10  PPT with march GTB at feet 2 x 10 SLS on airex with UE support 3 x 30 second holds Hip hike on airex 1 x 8 Palof press with PPT GTB 2 x 10 Squat 2 x 10  Treatment                            10/14/2023: Blank lines following charge title = not provided on this treatment date.   Manual:  TPDN No  There-ex: LTR x20 S/l clams GTB 2x15 3sec hold Seated HSS 2x30sec each Standing hip abduction GTB 3x10ea There-Act: Bridges 3x10 Squats at rail 2x10 Self Care:  Nuro-Re-ed: Hip hike off 4" step 3x20bil Supine marching with GTB x30 Supine SLR 2x10ea Gait Training:     10/10/23 Bridge 1 x 10  Bridge with clam GTB 2 x 10  S/l clams GTB 2 x 15 Standing hip abduction GTB 2 x 10  Weighted retro gait 10# 1 x 10   PATIENT EDUCATION:  Education details: Patient educated on exam findings, POC, scope of PT, HEP. 05/14/23: HEP Person educated: Patient Education method: Programmer, multimedia, Demonstration, and Handouts Education comprehension: verbalized understanding, returned demonstration, verbal cues required, and tactile cues required  HOME EXERCISE PROGRAM: Access Code: [email or web address] URL: [email or web address]   ASSESSMENT:  CLINICAL IMPRESSION: Began strengthening exercises with gym machines today which are tolerated well. Patient with some anterior hip pain and proximal hamstring symptoms noted during gait. Patient with hyperactive and tender rectus femoris. Educated on self STM for pain/mobility. Patient will continue to benefit from physical therapy in order to improve function and  reduce impairment.    OBJECTIVE IMPAIRMENTS: Abnormal gait, decreased activity tolerance, decreased balance, decreased endurance, decreased mobility, difficulty walking, decreased ROM, decreased strength, increased muscle spasms, impaired flexibility, improper body mechanics, postural dysfunction, and pain.   ACTIVITY LIMITATIONS: carrying, lifting, bending, standing, squatting, transfers, hygiene/grooming, locomotion level, and caring for others  PARTICIPATION LIMITATIONS: meal prep, cleaning, laundry, shopping, community activity, and yard work  PERSONAL FACTORS: Fitness, Time since onset of injury/illness/exacerbation, and 3+ comorbidities: HTN, HLD, DM, hx back pain  are also affecting patient's functional outcome.   REHAB POTENTIAL: Good  CLINICAL DECISION MAKING: Stable/uncomplicated  EVALUATION COMPLEXITY: Low   GOALS: Goals reviewed with patient? Yes  SHORT TERM GOALS: Target date: 05/28/2023    Patient will be independent with HEP in order to improve functional outcomes. Baseline:  Goal  status: MET (9/30)  2.  Patient will report at least 25% improvement in symptoms for improved quality of life. Baseline:  Goal status: MET  3. Patient will increase left hip ER to full without pain  Goal status: IN PROGRESS (30deg on 2/4)  4. Patient will increase right quad strength by 10 lbs  Goal status: IN PROGRESS   LONG TERM GOALS: Target date: 06/25/2023    Patient will report at least 75% improvement in symptoms for improved quality of life. Baseline:  Goal status: IN PROGRESS  2.  Patient will improve FOTO score by at least 9 points in order to indicate improved tolerance to activity. Baseline: 53% function 06/18/23: 53% function 53% 12/09 Goal status: INITIAL  3.  Patient will demonstrate at least 25% improvement in lumbar ROM in all restricted planes for improved ability to move trunk while completing chores. Baseline:  Goal status: MET  4.  Patient will report  improved confidence with gait, balance, and strength in roder to return to general exercise routine. Baseline:  Goal status: improving 2/4  5.  Patient will demonstrate 5lb increase in muscle testing in all tested musculature as evidence of improved strength to assist with stair ambulation and gait.   Baseline: see above Goal status: MET     PLAN:  PT FREQUENCY: 2x/week  PT DURATION: 6 weeks  PLANNED INTERVENTIONS: Therapeutic exercises, Therapeutic activity, Neuromuscular re-education, Balance training, Gait training, Patient/Family education, Joint manipulation, Joint mobilization, Stair training, Orthotic/Fit training, DME instructions, Aquatic Therapy, Dry Needling, Electrical stimulation, Spinal manipulation, Spinal mobilization, Cryotherapy, Moist heat, Compression bandaging, scar mobilization, Splintting, Taping, Traction, Ultrasound, Ionotophoresis 4mg /ml Dexamethasone, and Manual therapy  PLAN FOR NEXT SESSION: test balance, hip strength, functional strength, lumbar mobility, postural and core strength    Wyman Songster, PT 10/31/2023, 1:47 PM    Referring diagnosis? M54.42 (ICD-10-CM) - Left-sided low back pain with left-sided sciatica, unspecified chronicity Treatment diagnosis? (if different than referring diagnosis) M54.59 What was this (referring dx) caused by? []  Surgery [x]  Fall [x]  Ongoing issue []  Arthritis []  Other: ____________  Laterality: []  Rt []  Lt [x]  Both  Check all possible CPT codes:  *CHOOSE 10 OR LESS*    []  97110 (Therapeutic Exercise)  []  92507 (SLP Treatment)  []  97112 (Neuro Re-ed)   []  92526 (Swallowing Treatment)   a 60454 (Gait Training)   []  [id number] (Cognitive Training, 1st 15 minutes) []  97140 (Manual Therapy)   []  97130 (Cognitive Training, each add'l 15 minutes)  []  97164 (Re-evaluation)                              []  Other, List CPT Code ____________  []  97530 (Therapeutic Activities)     []  97535 (Self Care)   [x]  All  codes above (97110 - 97535)  []  97012 (Mechanical Traction)  []  97014 (E-stim Unattended)  []  97032 (E-stim manual)  []  97033 (Ionto)  []  97035 (Ultrasound) []  97750 (Physical Performance Training) []  [id number] (Aquatic Therapy) []  97016 (Vasopneumatic Device) []  [id number] (Paraffin) []  97034 (Contrast Bath) []  97597 (Wound Care 1st 20 sq cm) []  97598 (Wound Care each add'l 20 sq cm) []  09811 (Orthotic Fabrication, Fitting, Training Initial) []  [id number] (Prosthetic Management and Training Initial) []  [id number] (Orthotic or Prosthetic Training/ Modification Subsequent)

## 2023-11-04 ENCOUNTER — Ambulatory Visit (HOSPITAL_BASED_OUTPATIENT_CLINIC_OR_DEPARTMENT_OTHER): Payer: Medicare PPO | Attending: Family Medicine

## 2023-11-04 ENCOUNTER — Encounter (HOSPITAL_BASED_OUTPATIENT_CLINIC_OR_DEPARTMENT_OTHER): Payer: Self-pay

## 2023-11-04 DIAGNOSIS — R29898 Other symptoms and signs involving the musculoskeletal system: Secondary | ICD-10-CM | POA: Insufficient documentation

## 2023-11-04 DIAGNOSIS — M6281 Muscle weakness (generalized): Secondary | ICD-10-CM | POA: Diagnosis not present

## 2023-11-04 DIAGNOSIS — R2689 Other abnormalities of gait and mobility: Secondary | ICD-10-CM | POA: Insufficient documentation

## 2023-11-04 DIAGNOSIS — M5459 Other low back pain: Secondary | ICD-10-CM | POA: Insufficient documentation

## 2023-11-04 NOTE — Therapy (Addendum)
 OUTPATIENT PHYSICAL THERAPY TREATMENT      Patient Name: Joanne Mendoza MRN: [id number] DOB:[birth year], 78 y.o., female Today's Date: 11/04/2023    END OF SESSION:  PT End of Session - 11/04/23 1647     Visit Number 28    Number of Visits 44    Date for PT Re-Evaluation 11/18/23    Authorization Type [employer]    Authorization Time Period 10 visits approved  From 03.03.2025 - 03.29.2025    Authorization - Visit Number 1    Authorization - Number of Visits 10    Progress Note Due on Visit 31    PT Start Time 1355   pt arrived late   PT Stop Time 1430    PT Time Calculation (min) 35 min    Activity Tolerance Patient tolerated treatment well    Behavior During Therapy [facility] for tasks assessed/performed                Past Medical History:  Diagnosis Date   Hyperlipidemia    Hypertension    Past Surgical History:  Procedure Laterality Date   broken arm     CESAREAN SECTION     x 2    Patient Active Problem List   Diagnosis Date Noted   Hyperlipidemia associated with type 2 diabetes mellitus (HCC) 10/02/2018   White coat hypertension 09/07/2013   Severe obesity (BMI >= 40) (HCC) 05/21/2013   Type 2 diabetes mellitus with hyperglycemia (HCC) 05/20/2013   Essential hypertension 12/12/2008   NONSPECIFIC ABN FINDING RAD & OTH EXAM GU ORGAN 12/12/2008    PCP: Kristian Covey, MD  REFERRING PROVIDER: Kristian Covey, MD  REFERRING DIAG: [id number] (ICD-10-CM) - Left-sided low back pain with left-sided sciatica, unspecified chronicity  Rationale for Evaluation and Treatment: Rehabilitation  THERAPY DIAG:  Other abnormalities of gait and mobility  Muscle weakness (generalized)  Other symptoms and signs involving the musculoskeletal system  Other low back pain  ONSET DATE: chronic  SUBJECTIVE:                                                                                                                                                                                            SUBJECTIVE STATEMENT: Patient reports her hip bothered her mildly when walking after last session. Went away the next day.   EVAL: Patient states used to be more mobile but has decreased over last few years. Had a fall earlier this year when walking [place]. Some swelling and numbness in R knee but has been getting better. Some left leg issues as well. Patient states some back  aches and pains. Sciatic symptoms L hip/low back to L knee. Is afraid of falling so she limits activity which makes it worse, caught up in cycle.   PERTINENT HISTORY:  HTN, HLD, DM  PAIN:  Are you having pain? None at rest, 5-8/10 with activity.   PRECAUTIONS: None  WEIGHT BEARING RESTRICTIONS: No  FALLS:  Has patient fallen in last 6 months? Yes. Number of falls 1   PLOF: Independent  PATIENT GOALS: get more active again and feel better    OBJECTIVE: (objective measures from initial evaluation unless otherwise dated)  PATIENT SURVEYS:  FOTO 53% function 06/18/23: 53% function 12/9:54% 2/4:53%  SCREENING FOR RED FLAGS: Bowel or bladder incontinence: No Spinal tumors: No Cauda equina syndrome: No Compression fracture: No Abdominal aneurysm: No  COGNITION: Overall cognitive status: Within functional limits for tasks assessed     SENSATION: WFL   POSTURE: rounded shoulders, forward head, decreased lumbar lordosis, anterior pelvic tilt, and flexed trunk   PALPATION: Mild ttp lumbar paraspinals, L glutes  LUMBAR ROM:   AROM eval 12/9   Flexion 0% limited 0% limited   Extension 75% limited 50% limited 0% No pain   Right lateral flexion 25% limited 0% limited   Left lateral flexion 25% limited 0% limited   Right rotation     Left rotation      (Blank rows = not tested) * = pain/symptoms  LOWER EXTREMITY ROM:     Passive  Right eval Left eval R 2/4 L 2/4  Hip flexion      Hip extension      Hip abduction      Hip adduction      Hip  internal rotation      Hip external rotation   30active 44passive, 30active  Knee flexion      Knee extension      Ankle dorsiflexion      Ankle plantarflexion      Ankle inversion      Ankle eversion       (Blank rows = not tested) * = pain/symptoms  LOWER EXTREMITY MMT:    MMT Right eval Left eval Right 05/14/23 Left  05/14/23 Right 10/16 Left 06/18/23 Right 12/09 Left 12/09 Right 2/4 Left 2/4  Hip flexion 40.5 39.1   55.4 54.1 39.9 37.1 64.5 59.4  Hip extension   4/5 4/5 4/5 4/5      Hip abduction 43.0 41.4 4-/5 4/5 72.9/ 4-/5 60.5/ 4-/5 43.7 36.5 43.1 25.7!  Hip adduction            Hip internal rotation            Hip external rotation            Knee flexion            Knee extension 73.3 66.9   82 68.3 26.7 43.3 49.7 43.5  Ankle dorsiflexion            Ankle plantarflexion            Ankle inversion            Ankle eversion             (Blank rows = not tested) * = pain/symptoms   FUNCTIONAL TESTS:  5 times sit to stand: 9.53 seconds without UE support, mild unsteadiness no loss of balance   12/9 9 seconds      GAIT: Distance walked: 80 feet Assistive device utilized: None Level of assistance: Complete  Independence Comments: forward trunk, decreased foot clearance, bilateral glute weakness  TODAY'S TREATMENT:                                                                                                                              DATE:   11/04/2023: Blank lines following charge title = not provided on this treatment date.   Manual:  TPDN No  There-ex: Supine active hamstring stretch 10 x 5-10 second holds Standing hip abduction RTB 3x15ea There-Act: Bridges 3x10 Squats at rail 3x10 Step ups at 6" step x10 leading with L (single UE support). Nuro-Re-ed: Hip hike off 4" step 2x20bil Supine SLR 2x10ea Standing march on airex-single UE on rail x10 Modified tandem stance    10/31/23 Leg press 10# 2 x 10 Hamstring curl machine 10# 2 x  10 Supine hamstring isometrics 20 x 10 second holds Supine active hamstring stretch 10 x 5-10 second holds Standing hip hike 2 x 10  10/28/2023: Blank lines following charge title = not provided on this treatment date.   Manual:  TPDN No  There-ex: LTR x20 S/l clams GTB 2x15 3sec hold Standing hip abduction GTB 3x10ea There-Act: Bridges 3x10 Squats at rail 3x10 Nuro-Re-ed: Hip hike off 4" step 2x20bil Supine marching with GTB x30 Supine SLR 2x10ea, then 1x5 LAQ 4# 5" hold 3x10   10/24/2023: Blank lines following charge title = not provided on this treatment date.   Manual:  TPDN No  There-ex: LTR x20 S/l clams GTB 2x15 3sec hold Seated HSS 2x30sec each Standing hip abduction GTB 3x10ea There-Act: Bridges 3x10 Squats at rail 3x10 Nuro-Re-ed: Hip hike off 4" step 2x20bil Supine marching with GTB x30 Supine SLR 2x10ea   10/21/23 Bridge 1 x 10  Bridge with clam GTB 2 x 10  PPT with march GTB at feet 2 x 10 SLS on airex with UE support 3 x 30 second holds Hip hike on airex 1 x 8 Palof press with PPT GTB 2 x 10 Squat 2 x 10   PATIENT EDUCATION:  Education details: Patient educated on exam findings, POC, scope of PT, HEP. 05/14/23: HEP Person educated: Patient Education method: Programmer, multimedia, Demonstration, and Handouts Education comprehension: verbalized understanding, returned demonstration, verbal cues required, and tactile cues required  HOME EXERCISE PROGRAM: Access Code: [email or web address] URL: [email or web address]   ASSESSMENT:  CLINICAL IMPRESSION: Patient has good tolerance for progressions to hip strengthening today.  Able to increase with the reps with resisted standing hip abduction with fatigue reported abdominal pain.  She is challenged by single-leg stance with marching on Airex, standing it is mildly painful to place full weight onto left LE.  Challenged with stability during tandem stance as well as with marching on Airex.  Improved  tolerance for supine SLR bilaterally.   OBJECTIVE IMPAIRMENTS: Abnormal gait, decreased activity tolerance, decreased balance, decreased endurance, decreased mobility, difficulty walking, decreased ROM, decreased strength, increased muscle spasms, impaired flexibility, improper body mechanics, postural dysfunction, and pain.  ACTIVITY LIMITATIONS: carrying, lifting, bending, standing, squatting, transfers, hygiene/grooming, locomotion level, and caring for others  PARTICIPATION LIMITATIONS: meal prep, cleaning, laundry, shopping, community activity, and yard work  PERSONAL FACTORS: Fitness, Time since onset of injury/illness/exacerbation, and 3+ comorbidities: HTN, HLD, DM, hx back pain  are also affecting patient's functional outcome.   REHAB POTENTIAL: Good  CLINICAL DECISION MAKING: Stable/uncomplicated  EVALUATION COMPLEXITY: Low   GOALS: Goals reviewed with patient? Yes  SHORT TERM GOALS: Target date: 05/28/2023    Patient will be independent with HEP in order to improve functional outcomes. Baseline:  Goal status: MET (9/30)  2.  Patient will report at least 25% improvement in symptoms for improved quality of life. Baseline:  Goal status: MET  3. Patient will increase left hip ER to full without pain  Goal status: IN PROGRESS (30deg on 2/4)  4. Patient will increase right quad strength by 10 lbs  Goal status: IN PROGRESS   LONG TERM GOALS: Target date: 06/25/2023    Patient will report at least 75% improvement in symptoms for improved quality of life. Baseline:  Goal status: IN PROGRESS  2.  Patient will improve FOTO score by at least 9 points in order to indicate improved tolerance to activity. Baseline: 53% function 06/18/23: 53% function 53% 12/09 Goal status: INITIAL  3.  Patient will demonstrate at least 25% improvement in lumbar ROM in all restricted planes for improved ability to move trunk while completing chores. Baseline:  Goal status: MET  4.   Patient will report improved confidence with gait, balance, and strength in roder to return to general exercise routine. Baseline:  Goal status: improving 2/4  5.  Patient will demonstrate 5lb increase in muscle testing in all tested musculature as evidence of improved strength to assist with stair ambulation and gait.   Baseline: see above Goal status: MET     PLAN:  PT FREQUENCY: 2x/week  PT DURATION: 6 weeks  PLANNED INTERVENTIONS: Therapeutic exercises, Therapeutic activity, Neuromuscular re-education, Balance training, Gait training, Patient/Family education, Joint manipulation, Joint mobilization, Stair training, Orthotic/Fit training, DME instructions, Aquatic Therapy, Dry Needling, Electrical stimulation, Spinal manipulation, Spinal mobilization, Cryotherapy, Moist heat, Compression bandaging, scar mobilization, Splintting, Taping, Traction, Ultrasound, Ionotophoresis 4mg /ml Dexamethasone, and Manual therapy  PLAN FOR NEXT SESSION: test balance, hip strength, functional strength, lumbar mobility, postural and core strength    Donnel Saxon Valeria Boza, PTA 11/04/2023, 4:54 PM    Referring diagnosis? M54.42 (ICD-10-CM) - Left-sided low back pain with left-sided sciatica, unspecified chronicity Treatment diagnosis? (if different than referring diagnosis) M54.59 What was this (referring dx) caused by? []  Surgery [x]  Fall [x]  Ongoing issue []  Arthritis []  Other: ____________  Laterality: []  Rt []  Lt [x]  Both  Check all possible CPT codes:  *CHOOSE 10 OR LESS*    []  97110 (Therapeutic Exercise)  []  92507 (SLP Treatment)  []  97112 (Neuro Re-ed)   []  92526 (Swallowing Treatment)   a 23762 (Gait Training)   []  [id number] (Cognitive Training, 1st 15 minutes) []  97140 (Manual Therapy)   []  97130 (Cognitive Training, each add'l 15 minutes)  []  97164 (Re-evaluation)                              []  Other, List CPT Code ____________  []  97530 (Therapeutic Activities)     []  97535 (Self  Care)   [x]  All codes above (97110 - 97535)  []  97012 (Mechanical Traction)  []  97014 (E-stim Unattended)  []   16109 (E-stim manual)  []  60454 (Ionto)  []  97035 (Ultrasound) []  97750 (Physical Performance Training) []  [id number] (Aquatic Therapy) []  97016 (Vasopneumatic Device) []  [id number] (Paraffin) []  97034 (Contrast Bath) []  [phone] (Wound Care 1st 20 sq cm) []  97598 (Wound Care each add'l 20 sq cm) []  97760 (Orthotic Fabrication, Fitting, Training Initial) []  [id number] (Prosthetic Management and Training Initial) []  [id number] (Orthotic or Prosthetic Training/ Modification Subsequent)

## 2023-11-07 ENCOUNTER — Ambulatory Visit (HOSPITAL_BASED_OUTPATIENT_CLINIC_OR_DEPARTMENT_OTHER): Payer: Medicare PPO | Admitting: Physical Therapy

## 2023-11-07 ENCOUNTER — Encounter (HOSPITAL_BASED_OUTPATIENT_CLINIC_OR_DEPARTMENT_OTHER): Payer: Self-pay | Admitting: Physical Therapy

## 2023-11-07 DIAGNOSIS — R29898 Other symptoms and signs involving the musculoskeletal system: Secondary | ICD-10-CM | POA: Diagnosis not present

## 2023-11-07 DIAGNOSIS — M6281 Muscle weakness (generalized): Secondary | ICD-10-CM | POA: Diagnosis not present

## 2023-11-07 DIAGNOSIS — R2689 Other abnormalities of gait and mobility: Secondary | ICD-10-CM

## 2023-11-07 DIAGNOSIS — M5459 Other low back pain: Secondary | ICD-10-CM | POA: Diagnosis not present

## 2023-11-07 NOTE — Therapy (Signed)
 OUTPATIENT PHYSICAL THERAPY TREATMENT      Patient Name: Joanne Mendoza MRN: [id number] DOB:[birth year], 78 y.o., female Today's Date: 11/07/2023    END OF SESSION:  PT End of Session - 11/07/23 1349     Visit Number 29    Number of Visits 44    Date for PT Re-Evaluation 11/18/23    Authorization Type [employer]    Authorization Time Period 10 visits approved  From 03.03.2025 - 03.29.2025    Authorization - Visit Number 2    Authorization - Number of Visits 10    Progress Note Due on Visit 31    PT Start Time 1349    PT Stop Time 1431    PT Time Calculation (min) 42 min    Activity Tolerance Patient tolerated treatment well    Behavior During Therapy WFL for tasks assessed/performed                Past Medical History:  Diagnosis Date   Hyperlipidemia    Hypertension    Past Surgical History:  Procedure Laterality Date   broken arm     CESAREAN SECTION     x 2    Patient Active Problem List   Diagnosis Date Noted   Hyperlipidemia associated with type 2 diabetes mellitus (HCC) 10/02/2018   White coat hypertension 09/07/2013   Severe obesity (BMI >= 40) (HCC) 05/21/2013   Type 2 diabetes mellitus with hyperglycemia (HCC) 05/20/2013   Essential hypertension 12/12/2008   NONSPECIFIC ABN FINDING RAD & OTH EXAM GU ORGAN 12/12/2008    PCP: Kristian Covey, MD  REFERRING PROVIDER: Kristian Covey, MD  REFERRING DIAG: [phone] (ICD-10-CM) - Left-sided low back pain with left-sided sciatica, unspecified chronicity  Rationale for Evaluation and Treatment: Rehabilitation  THERAPY DIAG:  Other abnormalities of gait and mobility  Muscle weakness (generalized)  Other symptoms and signs involving the musculoskeletal system  Other low back pain  ONSET DATE: chronic  SUBJECTIVE:                                                                                                                                                                                            SUBJECTIVE STATEMENT: Patient reports her hip bothered her mildly when walking after last session. Went away the next day.   EVAL: Patient states used to be more mobile but has decreased over last few years. Had a fall earlier this year when walking [place] at [place]. Some swelling and numbness in R knee but has been getting better. Some left leg issues as well. Patient states some back aches and pains. Sciatic  symptoms L hip/low back to L knee. Is afraid of falling so she limits activity which makes it worse, caught up in cycle.   PERTINENT HISTORY:  HTN, HLD, DM  PAIN:  Are you having pain? None at rest, 5-8/10 with activity.   PRECAUTIONS: None  WEIGHT BEARING RESTRICTIONS: No  FALLS:  Has patient fallen in last 6 months? Yes. Number of falls 1   PLOF: Independent  PATIENT GOALS: get more active again and feel better    OBJECTIVE: (objective measures from initial evaluation unless otherwise dated)  PATIENT SURVEYS:  FOTO 53% function 06/18/23: 53% function 12/9:54% 2/4:53%  SCREENING FOR RED FLAGS: Bowel or bladder incontinence: No Spinal tumors: No Cauda equina syndrome: No Compression fracture: No Abdominal aneurysm: No  COGNITION: Overall cognitive status: Within functional limits for tasks assessed     SENSATION: WFL   POSTURE: rounded shoulders, forward head, decreased lumbar lordosis, anterior pelvic tilt, and flexed trunk   PALPATION: Mild ttp lumbar paraspinals, L glutes  LUMBAR ROM:   AROM eval 12/9   Flexion 0% limited 0% limited   Extension 75% limited 50% limited 0% No pain   Right lateral flexion 25% limited 0% limited   Left lateral flexion 25% limited 0% limited   Right rotation     Left rotation      (Blank rows = not tested) * = pain/symptoms  LOWER EXTREMITY ROM:     Passive  Right eval Left eval R 2/4 L 2/4  Hip flexion      Hip extension      Hip abduction      Hip adduction      Hip internal rotation       Hip external rotation   30active 44passive, 30active  Knee flexion      Knee extension      Ankle dorsiflexion      Ankle plantarflexion      Ankle inversion      Ankle eversion       (Blank rows = not tested) * = pain/symptoms  LOWER EXTREMITY MMT:    MMT Right eval Left eval Right 05/14/23 Left  05/14/23 Right 10/16 Left 06/18/23 Right 12/09 Left 12/09 Right 2/4 Left 2/4  Hip flexion 40.5 39.1   55.4 54.1 39.9 37.1 64.5 59.4  Hip extension   4/5 4/5 4/5 4/5      Hip abduction 43.0 41.4 4-/5 4/5 72.9/ 4-/5 60.5/ 4-/5 43.7 36.5 43.1 25.7!  Hip adduction            Hip internal rotation            Hip external rotation            Knee flexion            Knee extension 73.3 66.9   82 68.3 26.7 43.3 49.7 43.5  Ankle dorsiflexion            Ankle plantarflexion            Ankle inversion            Ankle eversion             (Blank rows = not tested) * = pain/symptoms   FUNCTIONAL TESTS:  5 times sit to stand: 9.53 seconds without UE support, mild unsteadiness no loss of balance   12/9 9 seconds      GAIT: Distance walked: 80 feet Assistive device utilized: None Level of assistance: Complete Independence Comments: forward trunk,  decreased foot clearance, bilateral glute weakness  TODAY'S TREATMENT:                                                                                                                              DATE:  11/07/23 Supine SLR 2x10ea Supine hamstring isometrics 20 x 10 second holds Supine active hamstring stretch 10 x 5-10 second holds Squats at rail 3x10 Standing hip abduction RTB 3x15ea Standing march on airex-single UE on rail 2x10 Modified tandem stance 2 x 20 second holds   11/04/2023: Blank lines following charge title = not provided on this treatment date.   Manual:  TPDN No  There-ex: Supine active hamstring stretch 10 x 5-10 second holds Standing hip abduction RTB 3x15ea There-Act: Bridges 3x10 Squats at rail  3x10 Step ups at 6" step x10 leading with L (single UE support). Nuro-Re-ed: Hip hike off 4" step 2x20bil Supine SLR 2x10ea Standing march on airex-single UE on rail x10 Modified tandem stance    10/31/23 Leg press 10# 2 x 10 Hamstring curl machine 10# 2 x 10 Supine hamstring isometrics 20 x 10 second holds Supine active hamstring stretch 10 x 5-10 second holds Standing hip hike 2 x 10  10/28/2023: Blank lines following charge title = not provided on this treatment date.   Manual:  TPDN No  There-ex: LTR x20 S/l clams GTB 2x15 3sec hold Standing hip abduction GTB 3x10ea There-Act: Bridges 3x10 Squats at rail 3x10 Nuro-Re-ed: Hip hike off 4" step 2x20bil Supine marching with GTB x30 Supine SLR 2x10ea, then 1x5 LAQ 4# 5" hold 3x10   10/24/2023: Blank lines following charge title = not provided on this treatment date.   Manual:  TPDN No  There-ex: LTR x20 S/l clams GTB 2x15 3sec hold Seated HSS 2x30sec each Standing hip abduction GTB 3x10ea There-Act: Bridges 3x10 Squats at rail 3x10 Nuro-Re-ed: Hip hike off 4" step 2x20bil Supine marching with GTB x30 Supine SLR 2x10ea   10/21/23 Bridge 1 x 10  Bridge with clam GTB 2 x 10  PPT with march GTB at feet 2 x 10 SLS on airex with UE support 3 x 30 second holds Hip hike on airex 1 x 8 Palof press with PPT GTB 2 x 10 Squat 2 x 10   PATIENT EDUCATION:  Education details: Patient educated on exam findings, POC, scope of PT, HEP. 05/14/23: HEP Person educated: Patient Education method: Programmer, multimedia, Demonstration, and Handouts Education comprehension: verbalized understanding, returned demonstration, verbal cues required, and tactile cues required  HOME EXERCISE PROGRAM: Access Code: [email or web address] URL: [email or web address]   ASSESSMENT:  CLINICAL IMPRESSION: Patient with improving tolerance to previously performed exercises. Session continued to focus on hip and LE strengthening and balance.  Patient will continue to benefit from physical therapy in order to improve function and reduce impairment.    OBJECTIVE IMPAIRMENTS: Abnormal gait, decreased activity tolerance, decreased balance, decreased endurance, decreased mobility, difficulty walking, decreased ROM, decreased strength, increased muscle spasms, impaired flexibility,  improper body mechanics, postural dysfunction, and pain.   ACTIVITY LIMITATIONS: carrying, lifting, bending, standing, squatting, transfers, hygiene/grooming, locomotion level, and caring for others  PARTICIPATION LIMITATIONS: meal prep, cleaning, laundry, shopping, community activity, and yard work  PERSONAL FACTORS: Fitness, Time since onset of injury/illness/exacerbation, and 3+ comorbidities: HTN, HLD, DM, hx back pain  are also affecting patient's functional outcome.   REHAB POTENTIAL: Good  CLINICAL DECISION MAKING: Stable/uncomplicated  EVALUATION COMPLEXITY: Low   GOALS: Goals reviewed with patient? Yes  SHORT TERM GOALS: Target date: 05/28/2023    Patient will be independent with HEP in order to improve functional outcomes. Baseline:  Goal status: MET (9/30)  2.  Patient will report at least 25% improvement in symptoms for improved quality of life. Baseline:  Goal status: MET  3. Patient will increase left hip ER to full without pain  Goal status: IN PROGRESS (30deg on 2/4)  4. Patient will increase right quad strength by 10 lbs  Goal status: IN PROGRESS   LONG TERM GOALS: Target date: 06/25/2023    Patient will report at least 75% improvement in symptoms for improved quality of life. Baseline:  Goal status: IN PROGRESS  2.  Patient will improve FOTO score by at least 9 points in order to indicate improved tolerance to activity. Baseline: 53% function 06/18/23: 53% function 53% 12/09 Goal status: INITIAL  3.  Patient will demonstrate at least 25% improvement in lumbar ROM in all restricted planes for improved ability to move  trunk while completing chores. Baseline:  Goal status: MET  4.  Patient will report improved confidence with gait, balance, and strength in roder to return to general exercise routine. Baseline:  Goal status: improving 2/4  5.  Patient will demonstrate 5lb increase in muscle testing in all tested musculature as evidence of improved strength to assist with stair ambulation and gait.   Baseline: see above Goal status: MET     PLAN:  PT FREQUENCY: 2x/week  PT DURATION: 6 weeks  PLANNED INTERVENTIONS: Therapeutic exercises, Therapeutic activity, Neuromuscular re-education, Balance training, Gait training, Patient/Family education, Joint manipulation, Joint mobilization, Stair training, Orthotic/Fit training, DME instructions, Aquatic Therapy, Dry Needling, Electrical stimulation, Spinal manipulation, Spinal mobilization, Cryotherapy, Moist heat, Compression bandaging, scar mobilization, Splintting, Taping, Traction, Ultrasound, Ionotophoresis 4mg /ml Dexamethasone, and Manual therapy  PLAN FOR NEXT SESSION: test balance, hip strength, functional strength, lumbar mobility, postural and core strength    Wyman Songster, PT 11/07/2023, 2:34 PM    Referring diagnosis? M54.42 (ICD-10-CM) - Left-sided low back pain with left-sided sciatica, unspecified chronicity Treatment diagnosis? (if different than referring diagnosis) M54.59 What was this (referring dx) caused by? []  Surgery [x]  Fall [x]  Ongoing issue []  Arthritis []  Other: ____________  Laterality: []  Rt []  Lt [x]  Both  Check all possible CPT codes:  *CHOOSE 10 OR LESS*    []  97110 (Therapeutic Exercise)  []  92507 (SLP Treatment)  []  97112 (Neuro Re-ed)   []  92526 (Swallowing Treatment)   a 16109 (Gait Training)   []  [id number] (Cognitive Training, 1st 15 minutes) []  97140 (Manual Therapy)   []  97130 (Cognitive Training, each add'l 15 minutes)  []  97164 (Re-evaluation)                              []  Other, List CPT Code  ____________  []  97530 (Therapeutic Activities)     []  97535 (Self Care)   [x]  All codes above (97110 - 97535)  []  97012 (  Mechanical Traction)  []  97014 (E-stim Unattended)  []  97032 (E-stim manual)  []  97033 (Ionto)  []  16109 (Ultrasound) []  97750 (Physical Performance Training) []  [id number] (Aquatic Therapy) []  97016 (Vasopneumatic Device) []  [id number] (Paraffin) []  97034 (Contrast Bath) []  97597 (Wound Care 1st 20 sq cm) []  97598 (Wound Care each add'l 20 sq cm) []  97760 (Orthotic Fabrication, Fitting, Training Initial) []  [id number] (Prosthetic Management and Training Initial) []  [id number] (Orthotic or Prosthetic Training/ Modification Subsequent)

## 2023-11-11 ENCOUNTER — Ambulatory Visit (HOSPITAL_BASED_OUTPATIENT_CLINIC_OR_DEPARTMENT_OTHER): Payer: Medicare PPO

## 2023-11-11 ENCOUNTER — Encounter (HOSPITAL_BASED_OUTPATIENT_CLINIC_OR_DEPARTMENT_OTHER): Payer: Self-pay

## 2023-11-11 DIAGNOSIS — M5459 Other low back pain: Secondary | ICD-10-CM | POA: Diagnosis not present

## 2023-11-11 DIAGNOSIS — R2689 Other abnormalities of gait and mobility: Secondary | ICD-10-CM | POA: Diagnosis not present

## 2023-11-11 DIAGNOSIS — R29898 Other symptoms and signs involving the musculoskeletal system: Secondary | ICD-10-CM | POA: Diagnosis not present

## 2023-11-11 DIAGNOSIS — M6281 Muscle weakness (generalized): Secondary | ICD-10-CM

## 2023-11-11 NOTE — Therapy (Signed)
 OUTPATIENT PHYSICAL THERAPY TREATMENT      Patient Name: Joanne Mendoza MRN: [id number] DOB:1946-07-05, 78 y.o., female Today's Date: 11/11/2023    END OF SESSION:  PT End of Session - 11/11/23 1359     Visit Number 30    Number of Visits 44    Date for PT Re-Evaluation 11/18/23    Authorization Time Period 10 visits approved  From 03.03.2025 - 03.29.2025    Authorization - Visit Number 3    Authorization - Number of Visits 10    Progress Note Due on Visit 31    PT Start Time 1351    PT Stop Time 1430    PT Time Calculation (min) 39 min    Activity Tolerance Patient tolerated treatment well    Behavior During Therapy WFL for tasks assessed/performed                 Past Medical History:  Diagnosis Date   Hyperlipidemia    Hypertension    Past Surgical History:  Procedure Laterality Date   broken arm     CESAREAN SECTION     x 2    Patient Active Problem List   Diagnosis Date Noted   Hyperlipidemia associated with type 2 diabetes mellitus (HCC) 10/02/2018   White coat hypertension 09/07/2013   Severe obesity (BMI >= 40) (HCC) 05/21/2013   Type 2 diabetes mellitus with hyperglycemia (HCC) 05/20/2013   Essential hypertension 12/12/2008   NONSPECIFIC ABN FINDING RAD & OTH EXAM GU ORGAN 12/12/2008    PCP: Kristian Covey, MD  REFERRING PROVIDER: Kristian Covey, MD  REFERRING DIAG: [phone] (ICD-10-CM) - Left-sided low back pain with left-sided sciatica, unspecified chronicity  Rationale for Evaluation and Treatment: Rehabilitation  THERAPY DIAG:  Other abnormalities of gait and mobility  Muscle weakness (generalized)  Other symptoms and signs involving the musculoskeletal system  Other low back pain  ONSET DATE: chronic  SUBJECTIVE:                                                                                                                                                                                           SUBJECTIVE  STATEMENT: Patient reports improved hip pain today, attributes this to the warmer weather.   EVAL: Patient states used to be more mobile but has decreased over last few years. Had a fall earlier this year when walking [place]. Some swelling and numbness in R knee but has been getting better. Some left leg issues as well. Patient states some back aches and pains. Sciatic symptoms L hip/low back to L knee. Is afraid of falling  so she limits activity which makes it worse, caught up in cycle.   PERTINENT HISTORY:  HTN, HLD, DM  PAIN:  Are you having pain? None at rest, 5-8/10 with activity.   PRECAUTIONS: None  WEIGHT BEARING RESTRICTIONS: No  FALLS:  Has patient fallen in last 6 months? Yes. Number of falls 1   PLOF: Independent  PATIENT GOALS: get more active again and feel better    OBJECTIVE: (objective measures from initial evaluation unless otherwise dated)  PATIENT SURVEYS:  FOTO 53% function 06/18/23: 53% function 12/9:54% 2/4:53%  SCREENING FOR RED FLAGS: Bowel or bladder incontinence: No Spinal tumors: No Cauda equina syndrome: No Compression fracture: No Abdominal aneurysm: No  COGNITION: Overall cognitive status: Within functional limits for tasks assessed     SENSATION: WFL   POSTURE: rounded shoulders, forward head, decreased lumbar lordosis, anterior pelvic tilt, and flexed trunk   PALPATION: Mild ttp lumbar paraspinals, L glutes  LUMBAR ROM:   AROM eval 12/9   Flexion 0% limited 0% limited   Extension 75% limited 50% limited 0% No pain   Right lateral flexion 25% limited 0% limited   Left lateral flexion 25% limited 0% limited   Right rotation     Left rotation      (Blank rows = not tested) * = pain/symptoms  LOWER EXTREMITY ROM:     Passive  Right eval Left eval R 2/4 L 2/4  Hip flexion      Hip extension      Hip abduction      Hip adduction      Hip internal rotation      Hip external rotation   30active  44passive, 30active  Knee flexion      Knee extension      Ankle dorsiflexion      Ankle plantarflexion      Ankle inversion      Ankle eversion       (Blank rows = not tested) * = pain/symptoms  LOWER EXTREMITY MMT:    MMT Right eval Left eval Right 05/14/23 Left  05/14/23 Right 10/16 Left 06/18/23 Right 12/09 Left 12/09 Right 2/4 Left 2/4  Hip flexion 40.5 39.1   55.4 54.1 39.9 37.1 64.5 59.4  Hip extension   4/5 4/5 4/5 4/5      Hip abduction 43.0 41.4 4-/5 4/5 72.9/ 4-/5 60.5/ 4-/5 43.7 36.5 43.1 25.7!  Hip adduction            Hip internal rotation            Hip external rotation            Knee flexion            Knee extension 73.3 66.9   82 68.3 26.7 43.3 49.7 43.5  Ankle dorsiflexion            Ankle plantarflexion            Ankle inversion            Ankle eversion             (Blank rows = not tested) * = pain/symptoms   FUNCTIONAL TESTS:  5 times sit to stand: 9.53 seconds without UE support, mild unsteadiness no loss of balance   12/9 9 seconds      GAIT: Distance walked: 80 feet Assistive device utilized: None Level of assistance: Complete Independence Comments: forward trunk, decreased foot clearance, bilateral glute weakness  TODAY'S TREATMENT:  DATE:   11/11/23 LTR x15ea Bridge 2x15 Supine SLR 2x5 R, 1x5 L Supine active hamstring stretch 10 x 5-10 second holds Squats at rail 3x10 Standing hip abduction RTB 3x15ea Modified tandem stance 2 x 20 second holds ea  11/07/23 Supine SLR 2x10ea Supine hamstring isometrics 20 x 10 second holds Supine active hamstring stretch 10 x 5-10 second holds Squats at rail 3x10 Standing hip abduction RTB 3x15ea Standing march on airex-single UE on rail 2x10 Modified tandem stance 2 x 20 second holds   11/04/2023: Blank lines following charge title = not provided on this  treatment date.   Manual:  TPDN No  There-ex: Supine active hamstring stretch 10 x 5-10 second holds Standing hip abduction RTB 3x15ea There-Act: Bridges 3x10 Squats at rail 3x10 Step ups at 6" step x10 leading with L (single UE support). Nuro-Re-ed: Hip hike off 4" step 2x20bil Supine SLR 2x10ea Standing march on airex-single UE on rail x10 Modified tandem stance    10/31/23 Leg press 10# 2 x 10 Hamstring curl machine 10# 2 x 10 Supine hamstring isometrics 20 x 10 second holds Supine active hamstring stretch 10 x 5-10 second holds Standing hip hike 2 x 10  10/28/2023: Blank lines following charge title = not provided on this treatment date.   Manual:  TPDN No  There-ex: LTR x20 S/l clams GTB 2x15 3sec hold Standing hip abduction GTB 3x10ea There-Act: Bridges 3x10 Squats at rail 3x10 Nuro-Re-ed: Hip hike off 4" step 2x20bil Supine marching with GTB x30 Supine SLR 2x10ea, then 1x5 LAQ 4# 5" hold 3x10   10/24/2023: Blank lines following charge title = not provided on this treatment date.   Manual:  TPDN No  There-ex: LTR x20 S/l clams GTB 2x15 3sec hold Seated HSS 2x30sec each Standing hip abduction GTB 3x10ea There-Act: Bridges 3x10 Squats at rail 3x10 Nuro-Re-ed: Hip hike off 4" step 2x20bil Supine marching with GTB x30 Supine SLR 2x10ea   10/21/23 Bridge 1 x 10  Bridge with clam GTB 2 x 10  PPT with march GTB at feet 2 x 10 SLS on airex with UE support 3 x 30 second holds Hip hike on airex 1 x 8 Palof press with PPT GTB 2 x 10 Squat 2 x 10   PATIENT EDUCATION:  Education details: Patient educated on exam findings, POC, scope of PT, HEP. 05/14/23: HEP Person educated: Patient Education method: Programmer, multimedia, Demonstration, and Handouts Education comprehension: verbalized understanding, returned demonstration, verbal cues required, and tactile cues required  HOME EXERCISE PROGRAM: Access Code: [email or web address] URL:  [email or web address]   ASSESSMENT:  CLINICAL IMPRESSION: Pt remains challenged by supine SLR and was instructed to not exceed pain limits with this. She demonstrates good overall strength with other exercises, though muscular fatigue is present. Pt is progressing well in PT, but does have set backs due to pain which comes and goes.   OBJECTIVE IMPAIRMENTS: Abnormal gait, decreased activity tolerance, decreased balance, decreased endurance, decreased mobility, difficulty walking, decreased ROM, decreased strength, increased muscle spasms, impaired flexibility, improper body mechanics, postural dysfunction, and pain.   ACTIVITY LIMITATIONS: carrying, lifting, bending, standing, squatting, transfers, hygiene/grooming, locomotion level, and caring for others  PARTICIPATION LIMITATIONS: meal prep, cleaning, laundry, shopping, community activity, and yard work  PERSONAL FACTORS: Fitness, Time since onset of injury/illness/exacerbation, and 3+ comorbidities: HTN, HLD, DM, hx back pain  are also affecting patient's functional outcome.   REHAB POTENTIAL: Good  CLINICAL DECISION MAKING: Stable/uncomplicated  EVALUATION COMPLEXITY: Low   GOALS: Goals reviewed with patient?  Yes  SHORT TERM GOALS: Target date: 05/28/2023    Patient will be independent with HEP in order to improve functional outcomes. Baseline:  Goal status: MET (9/30)  2.  Patient will report at least 25% improvement in symptoms for improved quality of life. Baseline:  Goal status: MET  3. Patient will increase left hip ER to full without pain  Goal status: IN PROGRESS (30deg on 2/4)  4. Patient will increase right quad strength by 10 lbs  Goal status: IN PROGRESS   LONG TERM GOALS: Target date: 06/25/2023    Patient will report at least 75% improvement in symptoms for improved quality of life. Baseline:  Goal status: IN PROGRESS  2.  Patient will improve FOTO score by at least 9 points in order  to indicate improved tolerance to activity. Baseline: 53% function 06/18/23: 53% function 53% 12/09 Goal status: INITIAL  3.  Patient will demonstrate at least 25% improvement in lumbar ROM in all restricted planes for improved ability to move trunk while completing chores. Baseline:  Goal status: MET  4.  Patient will report improved confidence with gait, balance, and strength in roder to return to general exercise routine. Baseline:  Goal status: improving 2/4  5.  Patient will demonstrate 5lb increase in muscle testing in all tested musculature as evidence of improved strength to assist with stair ambulation and gait.   Baseline: see above Goal status: MET     PLAN:  PT FREQUENCY: 2x/week  PT DURATION: 6 weeks  PLANNED INTERVENTIONS: Therapeutic exercises, Therapeutic activity, Neuromuscular re-education, Balance training, Gait training, Patient/Family education, Joint manipulation, Joint mobilization, Stair training, Orthotic/Fit training, DME instructions, Aquatic Therapy, Dry Needling, Electrical stimulation, Spinal manipulation, Spinal mobilization, Cryotherapy, Moist heat, Compression bandaging, scar mobilization, Splintting, Taping, Traction, Ultrasound, Ionotophoresis 4mg /ml Dexamethasone, and Manual therapy  PLAN FOR NEXT SESSION: test balance, hip strength, functional strength, lumbar mobility, postural and core strength    Donnel Saxon Haeleigh Streiff, PTA 11/11/2023, 5:14 PM    Referring diagnosis? M54.42 (ICD-10-CM) - Left-sided low back pain with left-sided sciatica, unspecified chronicity Treatment diagnosis? (if different than referring diagnosis) M54.59 What was this (referring dx) caused by? []  Surgery [x]  Fall [x]  Ongoing issue []  Arthritis []  Other: ____________  Laterality: []  Rt []  Lt [x]  Both  Check all possible CPT codes:  *CHOOSE 10 OR LESS*    []  97110 (Therapeutic Exercise)  []  92507 (SLP Treatment)  []  97112 (Neuro Re-ed)   []  92526 (Swallowing  Treatment)   a 40981 (Gait Training)   []  [id number] (Cognitive Training, 1st 15 minutes) []  97140 (Manual Therapy)   []  97130 (Cognitive Training, each add'l 15 minutes)  []  97164 (Re-evaluation)                              []  Other, List CPT Code ____________  []  97530 (Therapeutic Activities)     []  97535 (Self Care)   [x]  All codes above (97110 - 97535)  []  97012 (Mechanical Traction)  []  97014 (E-stim Unattended)  []  97032 (E-stim manual)  []  97033 (Ionto)  []  97035 (Ultrasound) []  97750 (Physical Performance Training) []  [id number] (Aquatic Therapy) []  97016 (Vasopneumatic Device) []  [id number] (Paraffin) []  97034 (Contrast Bath) []  97597 (Wound Care 1st 20 sq cm) []  97598 (Wound Care each add'l 20 sq cm) []  97760 (Orthotic Fabrication, Fitting, Training Initial) []  [id number] (Prosthetic Management and Training Initial) []  [id number] (Orthotic or Prosthetic Training/ Modification Subsequent)

## 2023-11-14 ENCOUNTER — Ambulatory Visit (HOSPITAL_BASED_OUTPATIENT_CLINIC_OR_DEPARTMENT_OTHER): Payer: Medicare PPO

## 2023-11-14 ENCOUNTER — Encounter (HOSPITAL_BASED_OUTPATIENT_CLINIC_OR_DEPARTMENT_OTHER): Payer: Self-pay

## 2023-11-14 DIAGNOSIS — M6281 Muscle weakness (generalized): Secondary | ICD-10-CM

## 2023-11-14 DIAGNOSIS — R29898 Other symptoms and signs involving the musculoskeletal system: Secondary | ICD-10-CM

## 2023-11-14 DIAGNOSIS — R2689 Other abnormalities of gait and mobility: Secondary | ICD-10-CM

## 2023-11-14 DIAGNOSIS — M5459 Other low back pain: Secondary | ICD-10-CM

## 2023-11-14 NOTE — Therapy (Signed)
 OUTPATIENT PHYSICAL THERAPY TREATMENT      Patient Name: Joanne Mendoza MRN: [id number] DOB:[birth year], 78 y.o., female Today's Date: 11/14/2023    END OF SESSION:  PT End of Session - 11/14/23 1407     Visit Number 31    Number of Visits 44    Date for PT Re-Evaluation 11/18/23    Authorization Type [employer]    Authorization Time Period 10 visits approved  From 03.03.2025 - 03.29.2025    Authorization - Visit Number 4    Authorization - Number of Visits 10    Progress Note Due on Visit 31    PT Start Time 1345    PT Stop Time 1430    PT Time Calculation (min) 45 min    Activity Tolerance Patient tolerated treatment well    Behavior During Therapy [facility] for tasks assessed/performed                  Past Medical History:  Diagnosis Date   Hyperlipidemia    Hypertension    Past Surgical History:  Procedure Laterality Date   broken arm     CESAREAN SECTION     x 2    Patient Active Problem List   Diagnosis Date Noted   Hyperlipidemia associated with type 2 diabetes mellitus (HCC) 10/02/2018   White coat hypertension 09/07/2013   Severe obesity (BMI >= 40) (HCC) 05/21/2013   Type 2 diabetes mellitus with hyperglycemia (HCC) 05/20/2013   Essential hypertension 12/12/2008   NONSPECIFIC ABN FINDING RAD & OTH EXAM GU ORGAN 12/12/2008    PCP: Kristian Covey, MD  REFERRING PROVIDER: Kristian Covey, MD  REFERRING DIAG: [phone] (ICD-10-CM) - Left-sided low back pain with left-sided sciatica, unspecified chronicity  Rationale for Evaluation and Treatment: Rehabilitation  THERAPY DIAG:  Other abnormalities of gait and mobility  Muscle weakness (generalized)  Other low back pain  Other symptoms and signs involving the musculoskeletal system  ONSET DATE: chronic  SUBJECTIVE:                                                                                                                                                                                            SUBJECTIVE STATEMENT: Patient reports minimal soreness after last session compared to previous sessions.   EVAL: Patient states used to be more mobile but has decreased over last few years. Had a fall earlier this year when walking [place] at [place]. Some swelling and numbness in R knee but has been getting better. Some left leg issues as well. Patient states some back aches and pains. Sciatic symptoms L hip/low back  to L knee. Is afraid of falling so she limits activity which makes it worse, caught up in cycle.   PERTINENT HISTORY:  HTN, HLD, DM  PAIN:  Are you having pain? None at rest, 5-8/10 with activity.   PRECAUTIONS: None  WEIGHT BEARING RESTRICTIONS: No  FALLS:  Has patient fallen in last 6 months? Yes. Number of falls 1   PLOF: Independent  PATIENT GOALS: get more active again and feel better    OBJECTIVE: (objective measures from initial evaluation unless otherwise dated)  PATIENT SURVEYS:  FOTO 53% function 06/18/23: 53% function 12/9:54% 2/4:53%  SCREENING FOR RED FLAGS: Bowel or bladder incontinence: No Spinal tumors: No Cauda equina syndrome: No Compression fracture: No Abdominal aneurysm: No  COGNITION: Overall cognitive status: Within functional limits for tasks assessed     SENSATION: WFL   POSTURE: rounded shoulders, forward head, decreased lumbar lordosis, anterior pelvic tilt, and flexed trunk   PALPATION: Mild ttp lumbar paraspinals, L glutes  LUMBAR ROM:   AROM eval 12/9   Flexion 0% limited 0% limited   Extension 75% limited 50% limited 0% No pain   Right lateral flexion 25% limited 0% limited   Left lateral flexion 25% limited 0% limited   Right rotation     Left rotation      (Blank rows = not tested) * = pain/symptoms  LOWER EXTREMITY ROM:     Passive  Right eval Left eval R 2/4 L 2/4  Hip flexion      Hip extension      Hip abduction      Hip adduction      Hip internal rotation      Hip  external rotation   30active 44passive, 30active  Knee flexion      Knee extension      Ankle dorsiflexion      Ankle plantarflexion      Ankle inversion      Ankle eversion       (Blank rows = not tested) * = pain/symptoms  LOWER EXTREMITY MMT:    MMT Right eval Left eval Right 05/14/23 Left  05/14/23 Right 10/16 Left 06/18/23 Right 12/09 Left 12/09 Right 2/4 Left 2/4  Hip flexion 40.5 39.1   55.4 54.1 39.9 37.1 64.5 59.4  Hip extension   4/5 4/5 4/5 4/5      Hip abduction 43.0 41.4 4-/5 4/5 72.9/ 4-/5 60.5/ 4-/5 43.7 36.5 43.1 25.7!  Hip adduction            Hip internal rotation            Hip external rotation            Knee flexion            Knee extension 73.3 66.9   82 68.3 26.7 43.3 49.7 43.5  Ankle dorsiflexion            Ankle plantarflexion            Ankle inversion            Ankle eversion             (Blank rows = not tested) * = pain/symptoms   FUNCTIONAL TESTS:  5 times sit to stand: 9.53 seconds without UE support, mild unsteadiness no loss of balance   12/9 9 seconds      GAIT: Distance walked: 80 feet Assistive device utilized: None Level of assistance: Complete Independence Comments: forward trunk, decreased foot clearance, bilateral  glute weakness  TODAY'S TREATMENT:                                                                                                                              DATE:   11/14/23 LTR x20ea Bridge 2x15 3 seconds hold Supine SLR 1x10ea Supine active hamstring stretch 10 x 5-10 second holds STS with green TB at thighs 3x10 Squats at rail 3x10 Standing hip abduction RTB 3x15ea Modified tandem stance 2 x 20 second holds ea Standing march on airex 2x20   11/11/23 LTR x15ea Bridge 2x15 Supine SLR 2x5 R, 1x5 L Supine active hamstring stretch 10 x 5-10 second holds Squats at rail 3x10 Standing hip abduction RTB 3x15ea Modified tandem stance 2 x 20 second holds ea  11/07/23 Supine SLR 2x10ea Supine  hamstring isometrics 20 x 10 second holds Supine active hamstring stretch 10 x 5-10 second holds Squats at rail 2x15 Lateral step up Standing hip abduction RTB 3x15ea Standing march on airex-single UE on rail 2x10 Modified tandem stance 2 x 20 second holds   11/04/2023: Blank lines following charge title = not provided on this treatment date.   Manual:  TPDN No  There-ex: Supine active hamstring stretch 10 x 5-10 second holds Standing hip abduction RTB 3x15ea There-Act: Bridges 3x10 Squats at rail 3x10 Step ups at 6" step x10 leading with L (single UE support). Nuro-Re-ed: Hip hike off 4" step 2x20bil Supine SLR 2x10ea Standing march on airex-single UE on rail x10 Modified tandem stance    10/31/23 Leg press 10# 2 x 10 Hamstring curl machine 10# 2 x 10 Supine hamstring isometrics 20 x 10 second holds Supine active hamstring stretch 10 x 5-10 second holds Standing hip hike 2 x 10  10/28/2023: Blank lines following charge title = not provided on this treatment date.   Manual:  TPDN No  There-ex: LTR x20 S/l clams GTB 2x15 3sec hold Standing hip abduction GTB 3x10ea There-Act: Bridges 3x10 Squats at rail 3x10 Nuro-Re-ed: Hip hike off 4" step 2x20bil Supine marching with GTB x30 Supine SLR 2x10ea, then 1x5 LAQ 4# 5" hold 3x10   10/24/2023: Blank lines following charge title = not provided on this treatment date.   Manual:  TPDN No  There-ex: LTR x20 S/l clams GTB 2x15 3sec hold Seated HSS 2x30sec each Standing hip abduction GTB 3x10ea There-Act: Bridges 3x10 Squats at rail 3x10 Nuro-Re-ed: Hip hike off 4" step 2x20bil Supine marching with GTB x30 Supine SLR 2x10ea   10/21/23 Bridge 1 x 10  Bridge with clam GTB 2 x 10  PPT with march GTB at feet 2 x 10 SLS on airex with UE support 3 x 30 second holds Hip hike on airex 1 x 8 Palof press with PPT GTB 2 x 10 Squat 2 x 10   PATIENT EDUCATION:  Education details: Patient educated on exam  findings, POC, scope of PT, HEP. 05/14/23: HEP Person educated: Patient Education method: Programmer, multimedia, Demonstration, and  Handouts Education comprehension: verbalized understanding, returned demonstration, verbal cues required, and tactile cues required  HOME EXERCISE PROGRAM: Access Code: [email or web address] URL: [email or web address]   ASSESSMENT:  CLINICAL IMPRESSION: Pt with improved tolerance for exercise today per self report. She did well with sit to stands today with band around thighs for abduction engagement. She demonstrates mild improvement with balance challenges. Pt will benefit from continued strengthening intervention.    OBJECTIVE IMPAIRMENTS: Abnormal gait, decreased activity tolerance, decreased balance, decreased endurance, decreased mobility, difficulty walking, decreased ROM, decreased strength, increased muscle spasms, impaired flexibility, improper body mechanics, postural dysfunction, and pain.   ACTIVITY LIMITATIONS: carrying, lifting, bending, standing, squatting, transfers, hygiene/grooming, locomotion level, and caring for others  PARTICIPATION LIMITATIONS: meal prep, cleaning, laundry, shopping, community activity, and yard work  PERSONAL FACTORS: Fitness, Time since onset of injury/illness/exacerbation, and 3+ comorbidities: HTN, HLD, DM, hx back pain  are also affecting patient's functional outcome.   REHAB POTENTIAL: Good  CLINICAL DECISION MAKING: Stable/uncomplicated  EVALUATION COMPLEXITY: Low   GOALS: Goals reviewed with patient? Yes  SHORT TERM GOALS: Target date: 05/28/2023    Patient will be independent with HEP in order to improve functional outcomes. Baseline:  Goal status: MET (9/30)  2.  Patient will report at least 25% improvement in symptoms for improved quality of life. Baseline:  Goal status: MET  3. Patient will increase left hip ER to full without pain  Goal status: IN PROGRESS (30deg on 2/4)  4. Patient will increase  right quad strength by 10 lbs  Goal status: IN PROGRESS   LONG TERM GOALS: Target date: 06/25/2023    Patient will report at least 75% improvement in symptoms for improved quality of life. Baseline:  Goal status: IN PROGRESS  2.  Patient will improve FOTO score by at least 9 points in order to indicate improved tolerance to activity. Baseline: 53% function 06/18/23: 53% function 53% 12/09 Goal status: INITIAL  3.  Patient will demonstrate at least 25% improvement in lumbar ROM in all restricted planes for improved ability to move trunk while completing chores. Baseline:  Goal status: MET  4.  Patient will report improved confidence with gait, balance, and strength in roder to return to general exercise routine. Baseline:  Goal status: improving 2/4  5.  Patient will demonstrate 5lb increase in muscle testing in all tested musculature as evidence of improved strength to assist with stair ambulation and gait.   Baseline: see above Goal status: MET     PLAN:  PT FREQUENCY: 2x/week  PT DURATION: 6 weeks  PLANNED INTERVENTIONS: Therapeutic exercises, Therapeutic activity, Neuromuscular re-education, Balance training, Gait training, Patient/Family education, Joint manipulation, Joint mobilization, Stair training, Orthotic/Fit training, DME instructions, Aquatic Therapy, Dry Needling, Electrical stimulation, Spinal manipulation, Spinal mobilization, Cryotherapy, Moist heat, Compression bandaging, scar mobilization, Splintting, Taping, Traction, Ultrasound, Ionotophoresis 4mg /ml Dexamethasone, and Manual therapy  PLAN FOR NEXT SESSION: test balance, hip strength, functional strength, lumbar mobility, postural and core strength    Donnel Saxon Breaker Springer, PTA 11/14/2023, 4:37 PM    Referring diagnosis? M54.42 (ICD-10-CM) - Left-sided low back pain with left-sided sciatica, unspecified chronicity Treatment diagnosis? (if different than referring diagnosis) M54.59 What was this  (referring dx) caused by? 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 Electrical engineer)   []  [phone] (Cognitive Training, 1st 15 minutes) []  97140 (Manual Therapy)   []  97130 (Cognitive Training, each add'l 15 minutes)  []  97164 (Re-evaluation)                              []  Other, List CPT Code ____________  []  97530 (Therapeutic Activities)     []  97535 (Self Care)   [x]  All codes above (97110 - 97535)  []  97012 (Mechanical Traction)  []  97014 (E-stim Unattended)  []  97032 (E-stim manual)  []  97033 (Ionto)  []  97035 (Ultrasound) []  97750 (Physical Performance Training) []  [id number] (Aquatic Therapy) []  97016 (Vasopneumatic Device) []  [id number] (Paraffin) []  97034 (Contrast Bath) []  97597 (Wound Care 1st 20 sq cm) []  82956 (Wound Care each add'l 20 sq cm) []  97760 (Orthotic Fabrication, Fitting, Training Initial) []  [id number] (Prosthetic Management and Training Initial) []  [phone] (Orthotic or Prosthetic Training/ Modification Subsequent)

## 2023-11-18 ENCOUNTER — Ambulatory Visit (HOSPITAL_BASED_OUTPATIENT_CLINIC_OR_DEPARTMENT_OTHER): Payer: Medicare PPO

## 2023-11-20 ENCOUNTER — Encounter (HOSPITAL_BASED_OUTPATIENT_CLINIC_OR_DEPARTMENT_OTHER): Payer: Self-pay | Admitting: Physical Therapy

## 2023-11-20 ENCOUNTER — Ambulatory Visit (HOSPITAL_BASED_OUTPATIENT_CLINIC_OR_DEPARTMENT_OTHER): Admitting: Physical Therapy

## 2023-11-20 DIAGNOSIS — R29898 Other symptoms and signs involving the musculoskeletal system: Secondary | ICD-10-CM

## 2023-11-20 DIAGNOSIS — M5459 Other low back pain: Secondary | ICD-10-CM

## 2023-11-20 DIAGNOSIS — M6281 Muscle weakness (generalized): Secondary | ICD-10-CM

## 2023-11-20 DIAGNOSIS — R2689 Other abnormalities of gait and mobility: Secondary | ICD-10-CM

## 2023-11-20 NOTE — Therapy (Signed)
 OUTPATIENT PHYSICAL THERAPY TREATMENT      Patient Name: Joanne Mendoza MRN: [id number] DOB:[birth year], 78 y.o., female Today's Date: 11/20/2023  Progress Note   Reporting Period 10/07/23 to 11/20/23   See note below for Objective Data and Assessment of Progress/Goals   END OF SESSION:  PT End of Session - 11/20/23 1151     Visit Number 32    Number of Visits 52    Date for PT Re-Evaluation 01/15/24    Authorization Type [employer]    Authorization Time Period 10 visits approved  From 03.03.2025 - 03.29.2025    Authorization - Number of Visits 10    Progress Note Due on Visit 31    PT Start Time 1150    PT Stop Time 1236    PT Time Calculation (min) 46 min    Activity Tolerance Patient tolerated treatment well    Behavior During Therapy [facility] for tasks assessed/performed                  Past Medical History:  Diagnosis Date   Hyperlipidemia    Hypertension    Past Surgical History:  Procedure Laterality Date   broken arm     CESAREAN SECTION     x 2    Patient Active Problem List   Diagnosis Date Noted   Hyperlipidemia associated with type 2 diabetes mellitus (HCC) 10/02/2018   White coat hypertension 09/07/2013   Severe obesity (BMI >= 40) (HCC) 05/21/2013   Type 2 diabetes mellitus with hyperglycemia (HCC) 05/20/2013   Essential hypertension 12/12/2008   NONSPECIFIC ABN FINDING RAD & OTH EXAM GU ORGAN 12/12/2008    PCP: Kristian Covey, MD  REFERRING PROVIDER: Kristian Covey, MD  REFERRING DIAG: [phone] (ICD-10-CM) - Left-sided low back pain with left-sided sciatica, unspecified chronicity  Rationale for Evaluation and Treatment: Rehabilitation  THERAPY DIAG:  Other abnormalities of gait and mobility  Muscle weakness (generalized)  Other low back pain  Other symptoms and signs involving the musculoskeletal system  ONSET DATE: chronic  SUBJECTIVE:                                                                                                                                                                                            SUBJECTIVE STATEMENT: Patient reports feeling pretty good. Hip hasn't been bothering her as much. Patient states 75% improvement with PT intervention. Has to limit things because of this issue. Wouldn't go to arena event due to steps/walking. Avoids prolonged standing due to the issues. Would love to keep strengthening hip so she could avoid surgery or be very strong if  she doesn't have to have surgery to improve outcomes. Still feels some weakness and mobility deficits although has improved.  EVAL: Patient states used to be more mobile but has decreased over last few years. Had a fall earlier this year when walking [place]. Some swelling and numbness in R knee but has been getting better. Some left leg issues as well. Patient states some back aches and pains. Sciatic symptoms L hip/low back to L knee. Is afraid of falling so she limits activity which makes it worse, caught up in cycle.   PERTINENT HISTORY:  HTN, HLD, DM  PAIN:  Are you having pain? None at rest, 5-8/10 with activity.   PRECAUTIONS: None  WEIGHT BEARING RESTRICTIONS: No  FALLS:  Has patient fallen in last 6 months? Yes. Number of falls 1   PLOF: Independent  PATIENT GOALS: get more active again and feel better    OBJECTIVE: (objective measures from initial evaluation unless otherwise dated)  PATIENT SURVEYS:  FOTO 53% function 06/18/23: 53% function 12/9:54% 2/4:53% 11/20/23: 53 % function  SCREENING FOR RED FLAGS: Bowel or bladder incontinence: No Spinal tumors: No Cauda equina syndrome: No Compression fracture: No Abdominal aneurysm: No  COGNITION: Overall cognitive status: Within functional limits for tasks assessed     SENSATION: WFL   POSTURE: rounded shoulders, forward head, decreased lumbar lordosis, anterior pelvic tilt, and flexed trunk   PALPATION: Mild ttp lumbar  paraspinals, L glutes  LUMBAR ROM:   AROM eval 12/9   Flexion 0% limited 0% limited   Extension 75% limited 50% limited 0% No pain   Right lateral flexion 25% limited 0% limited   Left lateral flexion 25% limited 0% limited   Right rotation     Left rotation      (Blank rows = not tested) * = pain/symptoms  LOWER EXTREMITY ROM:     Passive  Right eval Left eval R 2/4 L 2/4  Hip flexion      Hip extension      Hip abduction      Hip adduction      Hip internal rotation      Hip external rotation   30active 44passive, 30active  Knee flexion      Knee extension      Ankle dorsiflexion      Ankle plantarflexion      Ankle inversion      Ankle eversion       (Blank rows = not tested) * = pain/symptoms  LOWER EXTREMITY MMT:    MMT Right eval Left eval Right 05/14/23 Left  05/14/23 Right 10/16 Left 06/18/23 Right 12/09 Left 12/09 Right 2/4 Left 2/4 Right 11/20/23 Left 11/20/23  Hip flexion 40.5 39.1   55.4 54.1 39.9 37.1 64.5 59.4 52 54  Hip extension   4/5 4/5 4/5 4/5        Hip abduction 43.0 41.4 4-/5 4/5 72.9/ 4-/5 60.5/ 4-/5 43.7 36.5 43.1 25.7! 57.9 59.2  Hip adduction              Hip internal rotation              Hip external rotation              Knee flexion              Knee extension 73.3 66.9   82 68.3 26.7 43.3 49.7 43.5 61.8 65  Ankle dorsiflexion  Ankle plantarflexion              Ankle inversion              Ankle eversion               (Blank rows = not tested) * = pain/symptoms   FUNCTIONAL TESTS:  5 times sit to stand: 9.53 seconds without UE support, mild unsteadiness no loss of balance   12/9 9 seconds      GAIT: Distance walked: 80 feet Assistive device utilized: None Level of assistance: Complete Independence Comments: forward trunk, decreased foot clearance, bilateral glute weakness  Gait 11/20/23  TODAY'S TREATMENT:                                                                                                                               DATE:  11/20/23 Reassessment and discussion of POC   11/14/23 LTR x20ea Bridge 2x15 3 seconds hold Supine SLR 1x10ea Supine active hamstring stretch 10 x 5-10 second holds STS with green TB at thighs 3x10 Squats at rail 3x10 Standing hip abduction RTB 3x15ea Modified tandem stance 2 x 20 second holds ea Standing march on airex 2x20   11/11/23 LTR x15ea Bridge 2x15 Supine SLR 2x5 R, 1x5 L Supine active hamstring stretch 10 x 5-10 second holds Squats at rail 3x10 Standing hip abduction RTB 3x15ea Modified tandem stance 2 x 20 second holds ea  11/07/23 Supine SLR 2x10ea Supine hamstring isometrics 20 x 10 second holds Supine active hamstring stretch 10 x 5-10 second holds Squats at rail 2x15 Lateral step up Standing hip abduction RTB 3x15ea Standing march on airex-single UE on rail 2x10 Modified tandem stance 2 x 20 second holds   11/04/2023: Blank lines following charge title = not provided on this treatment date.   Manual:  TPDN No  There-ex: Supine active hamstring stretch 10 x 5-10 second holds Standing hip abduction RTB 3x15ea There-Act: Bridges 3x10 Squats at rail 3x10 Step ups at 6" step x10 leading with L (single UE support). Nuro-Re-ed: Hip hike off 4" step 2x20bil Supine SLR 2x10ea Standing march on airex-single UE on rail x10 Modified tandem stance    10/31/23 Leg press 10# 2 x 10 Hamstring curl machine 10# 2 x 10 Supine hamstring isometrics 20 x 10 second holds Supine active hamstring stretch 10 x 5-10 second holds Standing hip hike 2 x 10  10/28/2023: Blank lines following charge title = not provided on this treatment date.   Manual:  TPDN No  There-ex: LTR x20 S/l clams GTB 2x15 3sec hold Standing hip abduction GTB 3x10ea There-Act: Bridges 3x10 Squats at rail 3x10 Nuro-Re-ed: Hip hike off 4" step 2x20bil Supine marching with GTB x30 Supine SLR 2x10ea, then 1x5 LAQ 4# 5" hold  3x10   10/24/2023: Blank lines following charge title = not provided on this treatment date.   Manual:  TPDN No  There-ex: LTR x20 S/l clams GTB  2x15 3sec hold Seated HSS 2x30sec each Standing hip abduction GTB 3x10ea There-Act: Bridges 3x10 Squats at rail 3x10 Nuro-Re-ed: Hip hike off 4" step 2x20bil Supine marching with GTB x30 Supine SLR 2x10ea   10/21/23 Bridge 1 x 10  Bridge with clam GTB 2 x 10  PPT with march GTB at feet 2 x 10 SLS on airex with UE support 3 x 30 second holds Hip hike on airex 1 x 8 Palof press with PPT GTB 2 x 10 Squat 2 x 10   PATIENT EDUCATION:  Education details: Patient educated on exam findings, POC, scope of PT, HEP. 05/14/23: HEP Person educated: Patient Education method: Programmer, multimedia, Demonstration, and Handouts Education comprehension: verbalized understanding, returned demonstration, verbal cues required, and tactile cues required  HOME EXERCISE PROGRAM: Access Code: [email or web address] URL: [email or web address]   ASSESSMENT:  CLINICAL IMPRESSION: Patient has met 2/4 short term goals and 3/5 long term goals with ability to complete HEP and improvement in symptoms, strength, ROM, activity tolerance, and functional mobility. Remaining goals not met due to continued deficits in hip symptoms, strength, gait, activity tolerance and functional mobility.. Patient has made good progress toward remaining goals. Extending POC 1x/week for 8 weeks to continue to work toward remaining goals and build further HEP. Patient will continue to benefit from skilled physical therapy in order to improve function and reduce impairment.    OBJECTIVE IMPAIRMENTS: Abnormal gait, decreased activity tolerance, decreased balance, decreased endurance, decreased mobility, difficulty walking, decreased ROM, decreased strength, increased muscle spasms, impaired flexibility, improper body mechanics, postural dysfunction, and pain.   ACTIVITY LIMITATIONS:  carrying, lifting, bending, standing, squatting, transfers, hygiene/grooming, locomotion level, and caring for others  PARTICIPATION LIMITATIONS: meal prep, cleaning, laundry, shopping, community activity, and yard work  PERSONAL FACTORS: Fitness, Time since onset of injury/illness/exacerbation, and 3+ comorbidities: HTN, HLD, DM, hx back pain  are also affecting patient's functional outcome.   REHAB POTENTIAL: Good  CLINICAL DECISION MAKING: Stable/uncomplicated  EVALUATION COMPLEXITY: Low   GOALS: Goals reviewed with patient? Yes  SHORT TERM GOALS: Target date: 05/28/2023    Patient will be independent with HEP in order to improve functional outcomes. Baseline:  Goal status: MET (9/30)  2.  Patient will report at least 25% improvement in symptoms for improved quality of life. Baseline:  Goal status: MET  3. Patient will increase left hip ER to full without pain  Goal status: IN PROGRESS (30deg on 2/4)  4. Patient will increase right quad strength by 10 lbs  Goal status: IN PROGRESS   LONG TERM GOALS: Target date: 06/25/2023    Patient will report at least 75% improvement in symptoms for improved quality of life. Baseline:  Goal status: MET  2.  Patient will improve FOTO score by at least 9 points in order to indicate improved tolerance to activity. Baseline: 53% function 06/18/23: 53% function 53% 12/09 11/20/23: 53 Goal status: INITIAL  3.  Patient will demonstrate at least 25% improvement in lumbar ROM in all restricted planes for improved ability to move trunk while completing chores. Baseline:  Goal status: MET  4.  Patient will report improved confidence with gait, balance, and strength in roder to return to general exercise routine. Baseline:  Goal status: improving 2/4 11/20/23 feels balance is better, less fearful of falling, feels stronger. Not ready to go to gym yet due to fear of injury  5.  Patient will demonstrate 5lb increase in muscle testing in all  tested musculature as evidence of improved strength to assist with  stair ambulation and gait.   Baseline: see above Goal status: MET     PLAN:  PT FREQUENCY: 1-2x/week  PT DURATION: 8 weeks  PLANNED INTERVENTIONS: Therapeutic exercises, Therapeutic activity, Neuromuscular re-education, Balance training, Gait training, Patient/Family education, Joint manipulation, Joint mobilization, Stair training, Orthotic/Fit training, DME instructions, Aquatic Therapy, Dry Needling, Electrical stimulation, Spinal manipulation, Spinal mobilization, Cryotherapy, Moist heat, Compression bandaging, scar mobilization, Splintting, Taping, Traction, Ultrasound, Ionotophoresis 4mg /ml Dexamethasone, and Manual therapy  PLAN FOR NEXT SESSION: test balance, hip strength, functional strength, lumbar mobility, postural and core strength    Wyman Songster, PT 11/20/2023, 12:42 PM    Referring diagnosis? M54.42 (ICD-10-CM) - Left-sided low back pain with left-sided sciatica, unspecified chronicity Treatment diagnosis? (if different than referring diagnosis) M54.59 What was this (referring dx) caused by? []  Surgery [x]  Fall [x]  Ongoing issue []  Arthritis []  Other: ____________  Laterality: []  Rt []  Lt [x]  Both  Check all possible CPT codes:  *CHOOSE 10 OR LESS*    []  97110 (Therapeutic Exercise)  []  92507 (SLP Treatment)  []  40981 (Neuro Re-ed)   []  92526 (Swallowing Treatment)   a 19147 (Gait Training)   []  [id number] (Cognitive Training, 1st 15 minutes) []  97140 (Manual Therapy)   []  97130 (Cognitive Training, each add'l 15 minutes)  []  97164 (Re-evaluation)                              []  Other, List CPT Code ____________  []  97530 (Therapeutic Activities)     []  97535 (Self Care)   [x]  All codes above (97110 - 97535)  []  97012 (Mechanical Traction)  []  97014 (E-stim Unattended)  []  97032 (E-stim manual)  []  97033 (Ionto)  []  82956 (Ultrasound) []  97750 (Physical Performance Training) []   [id number] (Aquatic Therapy) []  97016 (Vasopneumatic Device) []  [id number] (Paraffin) []  97034 (Contrast Bath) []  97597 (Wound Care 1st 20 sq cm) []  97598 (Wound Care each add'l 20 sq cm) []  97760 (Orthotic Fabrication, Fitting, Training Initial) []  [id number] (Prosthetic Management and Training Initial) []  [id number] (Orthotic or Prosthetic Training/ Modification Subsequent)

## 2023-11-21 ENCOUNTER — Telehealth: Payer: Self-pay

## 2023-11-21 ENCOUNTER — Encounter (HOSPITAL_BASED_OUTPATIENT_CLINIC_OR_DEPARTMENT_OTHER): Payer: Medicare PPO

## 2023-11-21 NOTE — Telephone Encounter (Signed)
 Copied from CRM [id number]. Topic: Clinical - Medication Question >> Nov 21, 2023  9:54 AM Joanne Mendoza wrote: Reason for CRM: Patient wants to know if she can take an over the counter medication for her hip pain and what can she take.

## 2023-11-21 NOTE — Telephone Encounter (Signed)
 Copied from CRM [id number]. Topic: Clinical - Lab/Test Results >> Nov 21, 2023  9:52 AM Denese Killings wrote: Reason for CRM: Patient received a letter regarding a lab error and is requesting a callback about it.

## 2023-11-23 NOTE — Telephone Encounter (Signed)
 There was a lab computation error that did NOT affect most urine microalbumin levels.  We did receive  a list of patients whose values were affected.   No need to worry if not contacted.   The letter went out to all patient who have had this lab in recent years.    Kristian Covey MD [facility]

## 2023-11-24 NOTE — Telephone Encounter (Signed)
 Patient informed of the message below and voiced understanding

## 2023-11-24 NOTE — Telephone Encounter (Signed)
 Left a message for the patient to return my call.

## 2023-11-25 ENCOUNTER — Ambulatory Visit (HOSPITAL_BASED_OUTPATIENT_CLINIC_OR_DEPARTMENT_OTHER): Payer: Medicare PPO

## 2023-11-25 ENCOUNTER — Other Ambulatory Visit: Payer: Self-pay | Admitting: Family Medicine

## 2023-11-25 ENCOUNTER — Encounter (HOSPITAL_BASED_OUTPATIENT_CLINIC_OR_DEPARTMENT_OTHER): Payer: Self-pay

## 2023-11-25 DIAGNOSIS — R29898 Other symptoms and signs involving the musculoskeletal system: Secondary | ICD-10-CM

## 2023-11-25 DIAGNOSIS — M5459 Other low back pain: Secondary | ICD-10-CM

## 2023-11-25 DIAGNOSIS — M6281 Muscle weakness (generalized): Secondary | ICD-10-CM | POA: Diagnosis not present

## 2023-11-25 DIAGNOSIS — R2689 Other abnormalities of gait and mobility: Secondary | ICD-10-CM

## 2023-11-25 NOTE — Therapy (Signed)
 OUTPATIENT PHYSICAL THERAPY TREATMENT      Patient Name: Joanne Mendoza MRN: [id number] DOB:[birth year], 78 y.o., female Today's Date: 11/25/2023   END OF SESSION:  PT End of Session - 11/25/23 1423     Visit Number 33    Number of Visits 52    Date for PT Re-Evaluation 01/15/24    Authorization Type [employer]    Authorization Time Period 10 visits approved  From 03.03.2025 - 03.29.2025    Authorization - Visit Number 5    Authorization - Number of Visits 10    Progress Note Due on Visit 31    PT Start Time 1348    PT Stop Time 1430    PT Time Calculation (min) 42 min    Activity Tolerance Patient tolerated treatment well    Behavior During Therapy WFL for tasks assessed/performed                   Past Medical History:  Diagnosis Date   Hyperlipidemia    Hypertension    Past Surgical History:  Procedure Laterality Date   broken arm     CESAREAN SECTION     x 2    Patient Active Problem List   Diagnosis Date Noted   Hyperlipidemia associated with type 2 diabetes mellitus (HCC) 10/02/2018   White coat hypertension 09/07/2013   Severe obesity (BMI >= 40) (HCC) 05/21/2013   Type 2 diabetes mellitus with hyperglycemia (HCC) 05/20/2013   Essential hypertension 12/12/2008   NONSPECIFIC ABN FINDING RAD & OTH EXAM GU ORGAN 12/12/2008    PCP: Kristian Covey, MD  REFERRING PROVIDER: Kristian Covey, MD  REFERRING DIAG: [id number] (ICD-10-CM) - Left-sided low back pain with left-sided sciatica, unspecified chronicity  Rationale for Evaluation and Treatment: Rehabilitation  THERAPY DIAG:  Other abnormalities of gait and mobility  Muscle weakness (generalized)  Other low back pain  Other symptoms and signs involving the musculoskeletal system  ONSET DATE: chronic  SUBJECTIVE:                                                                                                                                                                                            SUBJECTIVE STATEMENT: Patient reports feeling pretty good. Hip hasn't been bothering her as much. Patient states 75% improvement with PT intervention. Has to limit things because of this issue. Wouldn't go to arena event due to steps/walking. Avoids prolonged standing due to the issues. Would love to keep strengthening hip so she could avoid surgery or be very strong if she doesn't have to have surgery to improve outcomes. Still feels some weakness  and mobility deficits although has improved.  EVAL: Patient states used to be more mobile but has decreased over last few years. Had a fall earlier this year when walking [place]. Some swelling and numbness in R knee but has been getting better. Some left leg issues as well. Patient states some back aches and pains. Sciatic symptoms L hip/low back to L knee. Is afraid of falling so she limits activity which makes it worse, caught up in cycle.   PERTINENT HISTORY:  HTN, HLD, DM  PAIN:  Are you having pain? None at rest, 5-8/10 with activity.   PRECAUTIONS: None  WEIGHT BEARING RESTRICTIONS: No  FALLS:  Has patient fallen in last 6 months? Yes. Number of falls 1   PLOF: Independent  PATIENT GOALS: get more active again and feel better    OBJECTIVE: (objective measures from initial evaluation unless otherwise dated)  PATIENT SURVEYS:  FOTO 53% function 06/18/23: 53% function 12/9:54% 2/4:53% 11/20/23: 53 % function  SCREENING FOR RED FLAGS: Bowel or bladder incontinence: No Spinal tumors: No Cauda equina syndrome: No Compression fracture: No Abdominal aneurysm: No  COGNITION: Overall cognitive status: Within functional limits for tasks assessed     SENSATION: WFL   POSTURE: rounded shoulders, forward head, decreased lumbar lordosis, anterior pelvic tilt, and flexed trunk   PALPATION: Mild ttp lumbar paraspinals, L glutes  LUMBAR ROM:   AROM eval 12/9   Flexion 0% limited 0% limited    Extension 75% limited 50% limited 0% No pain   Right lateral flexion 25% limited 0% limited   Left lateral flexion 25% limited 0% limited   Right rotation     Left rotation      (Blank rows = not tested) * = pain/symptoms  LOWER EXTREMITY ROM:     Passive  Right eval Left eval R 2/4 L 2/4  Hip flexion      Hip extension      Hip abduction      Hip adduction      Hip internal rotation      Hip external rotation   30active 44passive, 30active  Knee flexion      Knee extension      Ankle dorsiflexion      Ankle plantarflexion      Ankle inversion      Ankle eversion       (Blank rows = not tested) * = pain/symptoms  LOWER EXTREMITY MMT:    MMT Right eval Left eval Right 05/14/23 Left  05/14/23 Right 10/16 Left 06/18/23 Right 12/09 Left 12/09 Right 2/4 Left 2/4 Right 11/20/23 Left 11/20/23  Hip flexion 40.5 39.1   55.4 54.1 39.9 37.1 64.5 59.4 52 54  Hip extension   4/5 4/5 4/5 4/5        Hip abduction 43.0 41.4 4-/5 4/5 72.9/ 4-/5 60.5/ 4-/5 43.7 36.5 43.1 25.7! 57.9 59.2  Hip adduction              Hip internal rotation              Hip external rotation              Knee flexion              Knee extension 73.3 66.9   82 68.3 26.7 43.3 49.7 43.5 61.8 65  Ankle dorsiflexion              Ankle plantarflexion  Ankle inversion              Ankle eversion               (Blank rows = not tested) * = pain/symptoms   FUNCTIONAL TESTS:  5 times sit to stand: 9.53 seconds without UE support, mild unsteadiness no loss of balance   12/9 9 seconds      GAIT: Distance walked: 80 feet Assistive device utilized: None Level of assistance: Complete Independence Comments: forward trunk, decreased foot clearance, bilateral glute weakness  Gait 11/20/23  TODAY'S TREATMENT:                                                                                                                              DATE:   11/25/23 LTR x20ea Bridge 2x15 3 seconds  hold Supine SLR 2x10ea Supine active hamstring stretch 10 x 5-10 second holds STS with green TB at thighs 3x10 Squats at rail 3x10 Standing hip abduction RTB 3x15ea Modified tandem stance 2 x 30 second holds ea Standing march on airex 2x20 Hip hikes off 4" step 2x20ea   11/20/23 Reassessment and discussion of POC   11/14/23 LTR x20ea Bridge 2x15 3 seconds hold Supine SLR 1x10ea Supine active hamstring stretch 10 x 5-10 second holds STS with green TB at thighs 3x10 Squats at rail 3x10 Standing hip abduction RTB 3x15ea Modified tandem stance 2 x 20 second holds ea Standing march on airex 2x20   11/11/23 LTR x15ea Bridge 2x15 Supine SLR 2x5 R, 1x5 L Supine active hamstring stretch 10 x 5-10 second holds Squats at rail 3x10 Standing hip abduction RTB 3x15ea Modified tandem stance 2 x 20 second holds ea  11/07/23 Supine SLR 2x10ea Supine hamstring isometrics 20 x 10 second holds Supine active hamstring stretch 10 x 5-10 second holds Squats at rail 2x15 Lateral step up Standing hip abduction RTB 3x15ea Standing march on airex-single UE on rail 2x10 Modified tandem stance 2 x 20 second holds   11/04/2023: Blank lines following charge title = not provided on this treatment date.   Manual:  TPDN No  There-ex: Supine active hamstring stretch 10 x 5-10 second holds Standing hip abduction RTB 3x15ea There-Act: Bridges 3x10 Squats at rail 3x10 Step ups at 6" step x10 leading with L (single UE support). Nuro-Re-ed: Hip hike off 4" step 2x20bil Supine SLR 2x10ea Standing march on airex-single UE on rail x10 Modified tandem stance    10/31/23 Leg press 10# 2 x 10 Hamstring curl machine 10# 2 x 10 Supine hamstring isometrics 20 x 10 second holds Supine active hamstring stretch 10 x 5-10 second holds Standing hip hike 2 x 10  10/28/2023: Blank lines following charge title = not provided on this treatment date.   Manual:  TPDN No  There-ex: LTR x20 S/l clams GTB  2x15 3sec hold Standing hip abduction GTB 3x10ea There-Act: Bridges 3x10 Squats at rail 3x10 Nuro-Re-ed: Hip hike off 4" step  2x20bil Supine marching with GTB x30 Supine SLR 2x10ea, then 1x5 LAQ 4# 5" hold 3x10   10/24/2023: Blank lines following charge title = not provided on this treatment date.   Manual:  TPDN No  There-ex: LTR x20 S/l clams GTB 2x15 3sec hold Seated HSS 2x30sec each Standing hip abduction GTB 3x10ea There-Act: Bridges 3x10 Squats at rail 3x10 Nuro-Re-ed: Hip hike off 4" step 2x20bil Supine marching with GTB x30 Supine SLR 2x10ea   10/21/23 Bridge 1 x 10  Bridge with clam GTB 2 x 10  PPT with march GTB at feet 2 x 10 SLS on airex with UE support 3 x 30 second holds Hip hike on airex 1 x 8 Palof press with PPT GTB 2 x 10 Squat 2 x 10   PATIENT EDUCATION:  Education details: Patient educated on exam findings, POC, scope of PT, HEP. 05/14/23: HEP Person educated: Patient Education method: Programmer, multimedia, Demonstration, and Handouts Education comprehension: verbalized understanding, returned demonstration, verbal cues required, and tactile cues required  HOME EXERCISE PROGRAM: Access Code: [email or web address] URL: [email or web address]   ASSESSMENT:  CLINICAL IMPRESSION: Pt improving with performance of supine SLR and squatting based tasks. She responds well to cues for proper hip orientation with standing hip abduction. Minimal cuing required with hip hikes. Pt is progressing well with PT thus far, demonstrating improving strength and endurance.      OBJECTIVE IMPAIRMENTS: Abnormal gait, decreased activity tolerance, decreased balance, decreased endurance, decreased mobility, difficulty walking, decreased ROM, decreased strength, increased muscle spasms, impaired flexibility, improper body mechanics, postural dysfunction, and pain.   ACTIVITY LIMITATIONS: carrying, lifting, bending, standing, squatting, transfers, hygiene/grooming,  locomotion level, and caring for others  PARTICIPATION LIMITATIONS: meal prep, cleaning, laundry, shopping, community activity, and yard work  PERSONAL FACTORS: Fitness, Time since onset of injury/illness/exacerbation, and 3+ comorbidities: HTN, HLD, DM, hx back pain  are also affecting patient's functional outcome.   REHAB POTENTIAL: Good  CLINICAL DECISION MAKING: Stable/uncomplicated  EVALUATION COMPLEXITY: Low   GOALS: Goals reviewed with patient? Yes  SHORT TERM GOALS: Target date: 05/28/2023    Patient will be independent with HEP in order to improve functional outcomes. Baseline:  Goal status: MET (9/30)  2.  Patient will report at least 25% improvement in symptoms for improved quality of life. Baseline:  Goal status: MET  3. Patient will increase left hip ER to full without pain  Goal status: IN PROGRESS (30deg on 2/4)  4. Patient will increase right quad strength by 10 lbs  Goal status: IN PROGRESS   LONG TERM GOALS: Target date: 06/25/2023    Patient will report at least 75% improvement in symptoms for improved quality of life. Baseline:  Goal status: MET  2.  Patient will improve FOTO score by at least 9 points in order to indicate improved tolerance to activity. Baseline: 53% function 06/18/23: 53% function 53% 12/09 11/20/23: 53 Goal status: INITIAL  3.  Patient will demonstrate at least 25% improvement in lumbar ROM in all restricted planes for improved ability to move trunk while completing chores. Baseline:  Goal status: MET  4.  Patient will report improved confidence with gait, balance, and strength in roder to return to general exercise routine. Baseline:  Goal status: improving 2/4 11/20/23 feels balance is better, less fearful of falling, feels stronger. Not ready to go to gym yet due to fear of injury  5.  Patient will demonstrate 5lb increase in muscle testing in all tested musculature as evidence of improved strength to assist with  stair  ambulation and gait.   Baseline: see above Goal status: MET     PLAN:  PT FREQUENCY: 1-2x/week  PT DURATION: 8 weeks  PLANNED INTERVENTIONS: Therapeutic exercises, Therapeutic activity, Neuromuscular re-education, Balance training, Gait training, Patient/Family education, Joint manipulation, Joint mobilization, Stair training, Orthotic/Fit training, DME instructions, Aquatic Therapy, Dry Needling, Electrical stimulation, Spinal manipulation, Spinal mobilization, Cryotherapy, Moist heat, Compression bandaging, scar mobilization, Splintting, Taping, Traction, Ultrasound, Ionotophoresis 4mg /ml Dexamethasone, and Manual therapy  PLAN FOR NEXT SESSION: test balance, hip strength, functional strength, lumbar mobility, postural and core strength    Donnel Saxon Breeana Sawtelle, PTA 11/25/2023, 3:12 PM    Referring diagnosis? M54.42 (ICD-10-CM) - Left-sided low back pain with left-sided sciatica, unspecified chronicity Treatment diagnosis? (if different than referring diagnosis) M54.59 What was this (referring dx) caused by? []  Surgery [x]  Fall [x]  Ongoing issue []  Arthritis []  Other: ____________  Laterality: []  Rt []  Lt [x]  Both  Check all possible CPT codes:  *CHOOSE 10 OR LESS*    []  97110 (Therapeutic Exercise)  []  92507 (SLP Treatment)  []  97112 (Neuro Re-ed)   []  92526 (Swallowing Treatment)   a [id number] (Gait Training)   []  [id number] (Cognitive Training, 1st 15 minutes) []  97140 (Manual Therapy)   []  97130 (Cognitive Training, each add'l 15 minutes)  []  97164 (Re-evaluation)                              []  Other, List CPT Code ____________  []  97530 (Therapeutic Activities)     []  97535 (Self Care)   [x]  All codes above (97110 - 97535)  []  97012 (Mechanical Traction)  []  97014 (E-stim Unattended)  []  97032 (E-stim manual)  []  97033 (Ionto)  []  97035 (Ultrasound) []  97750 (Physical Performance Training) []  [id number] (Aquatic Therapy) []  97016 (Vasopneumatic Device) []  [id number]  (Paraffin) []  97034 (Contrast Bath) []  97597 (Wound Care 1st 20 sq cm) []  97598 (Wound Care each add'l 20 sq cm) []  97760 (Orthotic Fabrication, Fitting, Training Initial) []  [id number] (Prosthetic Management and Training Initial) []  [id number] (Orthotic or Prosthetic Training/ Modification Subsequent)

## 2023-11-28 ENCOUNTER — Encounter (HOSPITAL_BASED_OUTPATIENT_CLINIC_OR_DEPARTMENT_OTHER): Payer: Medicare PPO | Admitting: Physical Therapy

## 2023-12-05 ENCOUNTER — Encounter (HOSPITAL_BASED_OUTPATIENT_CLINIC_OR_DEPARTMENT_OTHER): Payer: Self-pay | Admitting: Physical Therapy

## 2023-12-05 ENCOUNTER — Ambulatory Visit (HOSPITAL_BASED_OUTPATIENT_CLINIC_OR_DEPARTMENT_OTHER): Attending: Family Medicine | Admitting: Physical Therapy

## 2023-12-05 DIAGNOSIS — M5459 Other low back pain: Secondary | ICD-10-CM

## 2023-12-05 DIAGNOSIS — M6281 Muscle weakness (generalized): Secondary | ICD-10-CM | POA: Diagnosis not present

## 2023-12-05 DIAGNOSIS — R29898 Other symptoms and signs involving the musculoskeletal system: Secondary | ICD-10-CM | POA: Diagnosis not present

## 2023-12-05 DIAGNOSIS — R2689 Other abnormalities of gait and mobility: Secondary | ICD-10-CM

## 2023-12-05 NOTE — Therapy (Signed)
 OUTPATIENT PHYSICAL THERAPY TREATMENT      Patient Name: Joanne Mendoza MRN: [id number] DOB:August 28, 1946, 78 y.o., female Today's Date: 12/05/2023   END OF SESSION:  PT End of Session - 12/05/23 1437     Visit Number 34    Number of Visits 52    Date for PT Re-Evaluation 01/15/24    Authorization Type [employer]    Authorization Time Period 10 visits approved  From 03.03.2025 - 03.29.2025    Authorization - Visit Number 6    Authorization - Number of Visits 10    Progress Note Due on Visit 31    PT Start Time 1436    PT Stop Time 1516    PT Time Calculation (min) 40 min    Activity Tolerance Patient tolerated treatment well    Behavior During Therapy [facility] for tasks assessed/performed                   Past Medical History:  Diagnosis Date   Hyperlipidemia    Hypertension    Past Surgical History:  Procedure Laterality Date   broken arm     CESAREAN SECTION     x 2    Patient Active Problem List   Diagnosis Date Noted   Hyperlipidemia associated with type 2 diabetes mellitus (HCC) 10/02/2018   White coat hypertension 09/07/2013   Severe obesity (BMI >= 40) (HCC) 05/21/2013   Type 2 diabetes mellitus with hyperglycemia (HCC) 05/20/2013   Essential hypertension 12/12/2008   NONSPECIFIC ABN FINDING RAD & OTH EXAM GU ORGAN 12/12/2008    PCP: Kristian Covey, MD  REFERRING PROVIDER: Kristian Covey, MD  REFERRING DIAG: [phone] (ICD-10-CM) - Left-sided low back pain with left-sided sciatica, unspecified chronicity  Rationale for Evaluation and Treatment: Rehabilitation  THERAPY DIAG:  Other abnormalities of gait and mobility  Muscle weakness (generalized)  Other low back pain  Other symptoms and signs involving the musculoskeletal system  ONSET DATE: chronic  SUBJECTIVE:                                                                                                                                                                                            SUBJECTIVE STATEMENT: Went to the outlet standing and bending and was sore.   EVAL: Patient states used to be more mobile but has decreased over last few years. Had a fall earlier this year when [place]. Some swelling and numbness in R knee but has been getting better. Some left leg issues as well. Patient states some back aches and pains. Sciatic symptoms L hip/low back to  L knee. Is afraid of falling so she limits activity which makes it worse, caught up in cycle.   PERTINENT HISTORY:  HTN, HLD, DM  PAIN:  Are you having pain? None at rest, 5-8/10 with activity.   PRECAUTIONS: None  WEIGHT BEARING RESTRICTIONS: No  FALLS:  Has patient fallen in last 6 months? Yes. Number of falls 1   PLOF: Independent  PATIENT GOALS: get more active again and feel better    OBJECTIVE: (objective measures from initial evaluation unless otherwise dated)  PATIENT SURVEYS:  FOTO 53% function 06/18/23: 53% function 12/9:54% 2/4:53% 11/20/23: 53 % function  SCREENING FOR RED FLAGS: Bowel or bladder incontinence: No Spinal tumors: No Cauda equina syndrome: No Compression fracture: No Abdominal aneurysm: No  COGNITION: Overall cognitive status: Within functional limits for tasks assessed     SENSATION: WFL   POSTURE: rounded shoulders, forward head, decreased lumbar lordosis, anterior pelvic tilt, and flexed trunk   PALPATION: Mild ttp lumbar paraspinals, L glutes  LUMBAR ROM:   AROM eval 12/9   Flexion 0% limited 0% limited   Extension 75% limited 50% limited 0% No pain   Right lateral flexion 25% limited 0% limited   Left lateral flexion 25% limited 0% limited   Right rotation     Left rotation      (Blank rows = not tested) * = pain/symptoms  LOWER EXTREMITY ROM:     Passive  Right eval Left eval R 2/4 L 2/4  Hip flexion      Hip extension      Hip abduction      Hip adduction      Hip internal rotation      Hip external  rotation   30active 44passive, 30active  Knee flexion      Knee extension      Ankle dorsiflexion      Ankle plantarflexion      Ankle inversion      Ankle eversion       (Blank rows = not tested) * = pain/symptoms  LOWER EXTREMITY MMT:    MMT Right eval Left eval Right 05/14/23 Left  05/14/23 Right 10/16 Left 06/18/23 Right 12/09 Left 12/09 Right 2/4 Left 2/4 Right 11/20/23 Left 11/20/23  Hip flexion 40.5 39.1   55.4 54.1 39.9 37.1 64.5 59.4 52 54  Hip extension   4/5 4/5 4/5 4/5        Hip abduction 43.0 41.4 4-/5 4/5 72.9/ 4-/5 60.5/ 4-/5 43.7 36.5 43.1 25.7! 57.9 59.2  Hip adduction              Hip internal rotation              Hip external rotation              Knee flexion              Knee extension 73.3 66.9   82 68.3 26.7 43.3 49.7 43.5 61.8 65  Ankle dorsiflexion              Ankle plantarflexion              Ankle inversion              Ankle eversion               (Blank rows = not tested) * = pain/symptoms   FUNCTIONAL TESTS:  5 times sit to stand: 9.53 seconds without UE support, mild unsteadiness no loss of balance  12/9 9 seconds      GAIT: Distance walked: 80 feet Assistive device utilized: None Level of assistance: Complete Independence Comments: forward trunk, decreased foot clearance, bilateral glute weakness  Gait 11/20/23  TODAY'S TREATMENT:                                                                                                                              DATE:  12/05/23 Alternating march 1 x 20 Squats at rail 3x10 Standing march on airex 2x20 Lateral step up 6 inch 2 x 10  Hip hikes off 4" step 2x20ea  Standing hip abduction RTB 3x15ea Modified tandem stance 2 x 30 second holds ea  11/25/23 LTR x20ea Bridge 2x15 3 seconds hold Supine SLR 2x10ea Supine active hamstring stretch 10 x 5-10 second holds STS with green TB at thighs 3x10 Squats at rail 3x10 Standing hip abduction RTB 3x15ea Modified tandem stance 2 x 30  second holds ea Standing march on airex 2x20 Hip hikes off 4" step 2x20ea   11/20/23 Reassessment and discussion of POC   11/14/23 LTR x20ea Bridge 2x15 3 seconds hold Supine SLR 1x10ea Supine active hamstring stretch 10 x 5-10 second holds STS with green TB at thighs 3x10 Squats at rail 3x10 Standing hip abduction RTB 3x15ea Modified tandem stance 2 x 20 second holds ea Standing march on airex 2x20   11/11/23 LTR x15ea Bridge 2x15 Supine SLR 2x5 R, 1x5 L Supine active hamstring stretch 10 x 5-10 second holds Squats at rail 3x10 Standing hip abduction RTB 3x15ea Modified tandem stance 2 x 20 second holds ea  11/07/23 Supine SLR 2x10ea Supine hamstring isometrics 20 x 10 second holds Supine active hamstring stretch 10 x 5-10 second holds Squats at rail 2x15 Lateral step up Standing hip abduction RTB 3x15ea Standing march on airex-single UE on rail 2x10 Modified tandem stance 2 x 20 second holds   11/04/2023: Blank lines following charge title = not provided on this treatment date.   Manual:  TPDN No  There-ex: Supine active hamstring stretch 10 x 5-10 second holds Standing hip abduction RTB 3x15ea There-Act: Bridges 3x10 Squats at rail 3x10 Step ups at 6" step x10 leading with L (single UE support). Nuro-Re-ed: Hip hike off 4" step 2x20bil Supine SLR 2x10ea Standing march on airex-single UE on rail x10 Modified tandem stance    10/31/23 Leg press 10# 2 x 10 Hamstring curl machine 10# 2 x 10 Supine hamstring isometrics 20 x 10 second holds Supine active hamstring stretch 10 x 5-10 second holds Standing hip hike 2 x 10  10/28/2023: Blank lines following charge title = not provided on this treatment date.   Manual:  TPDN No  There-ex: LTR x20 S/l clams GTB 2x15 3sec hold Standing hip abduction GTB 3x10ea There-Act: Bridges 3x10 Squats at rail 3x10 Nuro-Re-ed: Hip hike off 4" step 2x20bil Supine marching with GTB x30 Supine SLR 2x10ea, then  1x5 LAQ 4# 5" hold 3x10  10/24/2023: Blank lines following charge title = not provided on this treatment date.   Manual:  TPDN No  There-ex: LTR x20 S/l clams GTB 2x15 3sec hold Seated HSS 2x30sec each Standing hip abduction GTB 3x10ea There-Act: Bridges 3x10 Squats at rail 3x10 Nuro-Re-ed: Hip hike off 4" step 2x20bil Supine marching with GTB x30 Supine SLR 2x10ea   10/21/23 Bridge 1 x 10  Bridge with clam GTB 2 x 10  PPT with march GTB at feet 2 x 10 SLS on airex with UE support 3 x 30 second holds Hip hike on airex 1 x 8 Palof press with PPT GTB 2 x 10 Squat 2 x 10   PATIENT EDUCATION:  Education details: Patient educated on exam findings, POC, scope of PT, HEP. 05/14/23: HEP Person educated: Patient Education method: Programmer, multimedia, Demonstration, and Handouts Education comprehension: verbalized understanding, returned demonstration, verbal cues required, and tactile cues required  HOME EXERCISE PROGRAM: Access Code: [email or web address] URL: [email or web address]   ASSESSMENT:  CLINICAL IMPRESSION: Continued with UE strength and balance training which is improving. Patient not requiring as much UE support for balance.  Patient will continue to benefit from physical therapy in order to improve function and reduce impairment.    OBJECTIVE IMPAIRMENTS: Abnormal gait, decreased activity tolerance, decreased balance, decreased endurance, decreased mobility, difficulty walking, decreased ROM, decreased strength, increased muscle spasms, impaired flexibility, improper body mechanics, postural dysfunction, and pain.   ACTIVITY LIMITATIONS: carrying, lifting, bending, standing, squatting, transfers, hygiene/grooming, locomotion level, and caring for others  PARTICIPATION LIMITATIONS: meal prep, cleaning, laundry, shopping, community activity, and yard work  PERSONAL FACTORS: Fitness, Time since onset of injury/illness/exacerbation, and 3+ comorbidities: HTN, HLD,  DM, hx back pain  are also affecting patient's functional outcome.   REHAB POTENTIAL: Good  CLINICAL DECISION MAKING: Stable/uncomplicated  EVALUATION COMPLEXITY: Low   GOALS: Goals reviewed with patient? Yes  SHORT TERM GOALS: Target date: 05/28/2023    Patient will be independent with HEP in order to improve functional outcomes. Baseline:  Goal status: MET (9/30)  2.  Patient will report at least 25% improvement in symptoms for improved quality of life. Baseline:  Goal status: MET  3. Patient will increase left hip ER to full without pain  Goal status: IN PROGRESS (30deg on 2/4)  4. Patient will increase right quad strength by 10 lbs  Goal status: IN PROGRESS   LONG TERM GOALS: Target date: 06/25/2023    Patient will report at least 75% improvement in symptoms for improved quality of life. Baseline:  Goal status: MET  2.  Patient will improve FOTO score by at least 9 points in order to indicate improved tolerance to activity. Baseline: 53% function 06/18/23: 53% function 53% 12/09 11/20/23: 53 Goal status: INITIAL  3.  Patient will demonstrate at least 25% improvement in lumbar ROM in all restricted planes for improved ability to move trunk while completing chores. Baseline:  Goal status: MET  4.  Patient will report improved confidence with gait, balance, and strength in roder to return to general exercise routine. Baseline:  Goal status: improving 2/4 11/20/23 feels balance is better, less fearful of falling, feels stronger. Not ready to go to gym yet due to fear of injury  5.  Patient will demonstrate 5lb increase in muscle testing in all tested musculature as evidence of improved strength to assist with stair ambulation and gait.   Baseline: see above Goal status: MET     PLAN:  PT FREQUENCY: 1-2x/week  PT DURATION: 8 weeks  PLANNED INTERVENTIONS: Therapeutic exercises, Therapeutic activity, Neuromuscular re-education, Balance training, Gait training,  Patient/Family education, Joint manipulation, Joint mobilization, Stair training, Orthotic/Fit training, DME instructions, Aquatic Therapy, Dry Needling, Electrical stimulation, Spinal manipulation, Spinal mobilization, Cryotherapy, Moist heat, Compression bandaging, scar mobilization, Splintting, Taping, Traction, Ultrasound, Ionotophoresis 4mg /ml Dexamethasone, and Manual therapy  PLAN FOR NEXT SESSION: test balance, hip strength, functional strength, lumbar mobility, postural and core strength    Wyman Songster, PT 12/05/2023, 3:19 PM    Referring diagnosis? M54.42 (ICD-10-CM) - Left-sided low back pain with left-sided sciatica, unspecified chronicity Treatment diagnosis? (if different than referring diagnosis) M54.59 What was this (referring dx) caused by? []  Surgery [x]  Fall [x]  Ongoing issue []  Arthritis []  Other: ____________  Laterality: []  Rt []  Lt [x]  Both  Check all possible CPT codes:  *CHOOSE 10 OR LESS*    []  97110 (Therapeutic Exercise)  []  92507 (SLP Treatment)  []  97112 (Neuro Re-ed)   []  92526 (Swallowing Treatment)   a 27253 (Gait Training)   []  66440 (Cognitive Training, 1st 15 minutes) []  97140 (Manual Therapy)   []  97130 (Cognitive Training, each add'l 15 minutes)  []  97164 (Re-evaluation)                              []  Other, List CPT Code ____________  []  97530 (Therapeutic Activities)     []  97535 (Self Care)   [x]  All codes above (97110 - 97535)  []  97012 (Mechanical Traction)  []  97014 (E-stim Unattended)  []  97032 (E-stim manual)  []  97033 (Ionto)  []  97035 (Ultrasound) []  97750 (Physical Performance Training) []  [id number] (Aquatic Therapy) []  97016 (Vasopneumatic Device) []  [id number] (Paraffin) []  97034 (Contrast Bath) []  97597 (Wound Care 1st 20 sq cm) []  97598 (Wound Care each add'l 20 sq cm) []  97760 (Orthotic Fabrication, Fitting, Training Initial) []  [id number] (Prosthetic Management and Training Initial) []  [id number] (Orthotic or Prosthetic  Training/ Modification Subsequent)

## 2023-12-09 ENCOUNTER — Encounter (HOSPITAL_BASED_OUTPATIENT_CLINIC_OR_DEPARTMENT_OTHER): Payer: Self-pay | Admitting: Physical Therapy

## 2023-12-09 ENCOUNTER — Ambulatory Visit (HOSPITAL_BASED_OUTPATIENT_CLINIC_OR_DEPARTMENT_OTHER): Payer: Self-pay | Admitting: Physical Therapy

## 2023-12-09 DIAGNOSIS — R29898 Other symptoms and signs involving the musculoskeletal system: Secondary | ICD-10-CM | POA: Diagnosis not present

## 2023-12-09 DIAGNOSIS — M6281 Muscle weakness (generalized): Secondary | ICD-10-CM

## 2023-12-09 DIAGNOSIS — M5459 Other low back pain: Secondary | ICD-10-CM | POA: Diagnosis not present

## 2023-12-09 DIAGNOSIS — R2689 Other abnormalities of gait and mobility: Secondary | ICD-10-CM

## 2023-12-09 NOTE — Therapy (Signed)
 OUTPATIENT PHYSICAL THERAPY TREATMENT      Patient Name: Joanne Mendoza MRN: [id number] DOB:1946-02-24, 78 y.o., female Today's Date: 12/09/2023   END OF SESSION:  PT End of Session - 12/09/23 1352     Visit Number 35    Number of Visits 52    Date for PT Re-Evaluation 01/15/24    Authorization Type [employer]    Authorization Time Period 10 visits approved  From 03.03.2025 - 03.29.2025    Authorization - Visit Number 7    Authorization - Number of Visits 10    Progress Note Due on Visit 31    PT Start Time 1351    PT Stop Time 1430    PT Time Calculation (min) 39 min    Activity Tolerance Patient tolerated treatment well    Behavior During Therapy WFL for tasks assessed/performed                   Past Medical History:  Diagnosis Date   Hyperlipidemia    Hypertension    Past Surgical History:  Procedure Laterality Date   broken arm     CESAREAN SECTION     x 2    Patient Active Problem List   Diagnosis Date Noted   Hyperlipidemia associated with type 2 diabetes mellitus (HCC) 10/02/2018   White coat hypertension 09/07/2013   Severe obesity (BMI >= 40) (HCC) 05/21/2013   Type 2 diabetes mellitus with hyperglycemia (HCC) 05/20/2013   Essential hypertension 12/12/2008   NONSPECIFIC ABN FINDING RAD & OTH EXAM GU ORGAN 12/12/2008    PCP: Kristian Covey, MD  REFERRING PROVIDER: Kristian Covey, MD  REFERRING DIAG: [phone] (ICD-10-CM) - Left-sided low back pain with left-sided sciatica, unspecified chronicity  Rationale for Evaluation and Treatment: Rehabilitation  THERAPY DIAG:  Other abnormalities of gait and mobility  Muscle weakness (generalized)  Other low back pain  Other symptoms and signs involving the musculoskeletal system  ONSET DATE: chronic  SUBJECTIVE:                                                                                                                                                                                            SUBJECTIVE STATEMENT: Went to the outlet standing and bending and was sore.   EVAL: Patient states used to be more mobile but has decreased over last few years. Had a fall earlier this year when walking [place]. Some swelling and numbness in R knee but has been getting better. Some left leg issues as well. Patient states some back aches and pains. Sciatic symptoms L hip/low back to  L knee. Is afraid of falling so she limits activity which makes it worse, caught up in cycle.   PERTINENT HISTORY:  HTN, HLD, DM  PAIN:  Are you having pain? None at rest, 5-8/10 with activity.   PRECAUTIONS: None  WEIGHT BEARING RESTRICTIONS: No  FALLS:  Has patient fallen in last 6 months? Yes. Number of falls 1   PLOF: Independent  PATIENT GOALS: get more active again and feel better    OBJECTIVE: (objective measures from initial evaluation unless otherwise dated)  PATIENT SURVEYS:  FOTO 53% function 06/18/23: 53% function 12/9:54% 2/4:53% 11/20/23: 53 % function  SCREENING FOR RED FLAGS: Bowel or bladder incontinence: No Spinal tumors: No Cauda equina syndrome: No Compression fracture: No Abdominal aneurysm: No  COGNITION: Overall cognitive status: Within functional limits for tasks assessed     SENSATION: WFL   POSTURE: rounded shoulders, forward head, decreased lumbar lordosis, anterior pelvic tilt, and flexed trunk   PALPATION: Mild ttp lumbar paraspinals, L glutes  LUMBAR ROM:   AROM eval 12/9   Flexion 0% limited 0% limited   Extension 75% limited 50% limited 0% No pain   Right lateral flexion 25% limited 0% limited   Left lateral flexion 25% limited 0% limited   Right rotation     Left rotation      (Blank rows = not tested) * = pain/symptoms  LOWER EXTREMITY ROM:     Passive  Right eval Left eval R 2/4 L 2/4  Hip flexion      Hip extension      Hip abduction      Hip adduction      Hip internal rotation      Hip external  rotation   30active 44passive, 30active  Knee flexion      Knee extension      Ankle dorsiflexion      Ankle plantarflexion      Ankle inversion      Ankle eversion       (Blank rows = not tested) * = pain/symptoms  LOWER EXTREMITY MMT:    MMT Right eval Left eval Right 05/14/23 Left  05/14/23 Right 10/16 Left 06/18/23 Right 12/09 Left 12/09 Right 2/4 Left 2/4 Right 11/20/23 Left 11/20/23  Hip flexion 40.5 39.1   55.4 54.1 39.9 37.1 64.5 59.4 52 54  Hip extension   4/5 4/5 4/5 4/5        Hip abduction 43.0 41.4 4-/5 4/5 72.9/ 4-/5 60.5/ 4-/5 43.7 36.5 43.1 25.7! 57.9 59.2  Hip adduction              Hip internal rotation              Hip external rotation              Knee flexion              Knee extension 73.3 66.9   82 68.3 26.7 43.3 49.7 43.5 61.8 65  Ankle dorsiflexion              Ankle plantarflexion              Ankle inversion              Ankle eversion               (Blank rows = not tested) * = pain/symptoms   FUNCTIONAL TESTS:  5 times sit to stand: 9.53 seconds without UE support, mild unsteadiness no loss of balance  12/9 9 seconds      GAIT: Distance walked: 80 feet Assistive device utilized: None Level of assistance: Complete Independence Comments: forward trunk, decreased foot clearance, bilateral glute weakness  Gait 11/20/23  TODAY'S TREATMENT:                                                                                                                              DATE:  12/09/23 Alternating march 1 x 20 Standing march on airex 2x20 Lateral step up and over on airex  Squats at rail 3x10 Mini squat lateral slide outs 2 x 10 Mini squat retro slide outs 2 x 10 Hip hikes off 4" step 1x20ea   12/05/23 Alternating march 1 x 20 Squats at rail 3x10 Standing march on airex 2x20 Lateral step up 6 inch 2 x 10  Hip hikes off 4" step 2x20ea  Standing hip abduction RTB 3x15ea Modified tandem stance 2 x 30 second holds ea  11/25/23 LTR  x20ea Bridge 2x15 3 seconds hold Supine SLR 2x10ea Supine active hamstring stretch 10 x 5-10 second holds STS with green TB at thighs 3x10 Squats at rail 3x10 Standing hip abduction RTB 3x15ea Modified tandem stance 2 x 30 second holds ea Standing march on airex 2x20 Hip hikes off 4" step 2x20ea   11/20/23 Reassessment and discussion of POC   11/14/23 LTR x20ea Bridge 2x15 3 seconds hold Supine SLR 1x10ea Supine active hamstring stretch 10 x 5-10 second holds STS with green TB at thighs 3x10 Squats at rail 3x10 Standing hip abduction RTB 3x15ea Modified tandem stance 2 x 20 second holds ea Standing march on airex 2x20   11/11/23 LTR x15ea Bridge 2x15 Supine SLR 2x5 R, 1x5 L Supine active hamstring stretch 10 x 5-10 second holds Squats at rail 3x10 Standing hip abduction RTB 3x15ea Modified tandem stance 2 x 20 second holds ea  11/07/23 Supine SLR 2x10ea Supine hamstring isometrics 20 x 10 second holds Supine active hamstring stretch 10 x 5-10 second holds Squats at rail 2x15 Lateral step up Standing hip abduction RTB 3x15ea Standing march on airex-single UE on rail 2x10 Modified tandem stance 2 x 20 second holds   11/04/2023: Blank lines following charge title = not provided on this treatment date.   Manual:  TPDN No  There-ex: Supine active hamstring stretch 10 x 5-10 second holds Standing hip abduction RTB 3x15ea There-Act: Bridges 3x10 Squats at rail 3x10 Step ups at 6" step x10 leading with L (single UE support). Nuro-Re-ed: Hip hike off 4" step 2x20bil Supine SLR 2x10ea Standing march on airex-single UE on rail x10 Modified tandem stance    10/31/23 Leg press 10# 2 x 10 Hamstring curl machine 10# 2 x 10 Supine hamstring isometrics 20 x 10 second holds Supine active hamstring stretch 10 x 5-10 second holds Standing hip hike 2 x 10  10/28/2023: Blank lines following charge title = not provided on this treatment date.   Manual:  TPDN  No  There-ex: LTR x20 S/l clams GTB 2x15 3sec hold Standing hip abduction GTB 3x10ea There-Act: Bridges 3x10 Squats at rail 3x10 Nuro-Re-ed: Hip hike off 4" step 2x20bil Supine marching with GTB x30 Supine SLR 2x10ea, then 1x5 LAQ 4# 5" hold 3x10   10/24/2023: Blank lines following charge title = not provided on this treatment date.   Manual:  TPDN No  There-ex: LTR x20 S/l clams GTB 2x15 3sec hold Seated HSS 2x30sec each Standing hip abduction GTB 3x10ea There-Act: Bridges 3x10 Squats at rail 3x10 Nuro-Re-ed: Hip hike off 4" step 2x20bil Supine marching with GTB x30 Supine SLR 2x10ea   10/21/23 Bridge 1 x 10  Bridge with clam GTB 2 x 10  PPT with march GTB at feet 2 x 10 SLS on airex with UE support 3 x 30 second holds Hip hike on airex 1 x 8 Palof press with PPT GTB 2 x 10 Squat 2 x 10   PATIENT EDUCATION:  Education details: Patient educated on exam findings, POC, scope of PT, HEP. 05/14/23: HEP Person educated: Patient Education method: Programmer, multimedia, Demonstration, and Handouts Education comprehension: verbalized understanding, returned demonstration, verbal cues required, and tactile cues required  HOME EXERCISE PROGRAM: Access Code: [email or web address] URL: [email or web address]   ASSESSMENT:  CLINICAL IMPRESSION: Continued with strengthening exercises with emphasis on glutes and quads. Patient with improving strengthand motor control. Cueing provided for reducing UE support as able.  Patient will continue to benefit from physical therapy in order to improve function and reduce impairment.    OBJECTIVE IMPAIRMENTS: Abnormal gait, decreased activity tolerance, decreased balance, decreased endurance, decreased mobility, difficulty walking, decreased ROM, decreased strength, increased muscle spasms, impaired flexibility, improper body mechanics, postural dysfunction, and pain.   ACTIVITY LIMITATIONS: carrying, lifting, bending, standing, squatting,  transfers, hygiene/grooming, locomotion level, and caring for others  PARTICIPATION LIMITATIONS: meal prep, cleaning, laundry, shopping, community activity, and yard work  PERSONAL FACTORS: Fitness, Time since onset of injury/illness/exacerbation, and 3+ comorbidities: HTN, HLD, DM, hx back pain  are also affecting patient's functional outcome.   REHAB POTENTIAL: Good  CLINICAL DECISION MAKING: Stable/uncomplicated  EVALUATION COMPLEXITY: Low   GOALS: Goals reviewed with patient? Yes  SHORT TERM GOALS: Target date: 05/28/2023    Patient will be independent with HEP in order to improve functional outcomes. Baseline:  Goal status: MET (9/30)  2.  Patient will report at least 25% improvement in symptoms for improved quality of life. Baseline:  Goal status: MET  3. Patient will increase left hip ER to full without pain  Goal status: IN PROGRESS (30deg on 2/4)  4. Patient will increase right quad strength by 10 lbs  Goal status: IN PROGRESS   LONG TERM GOALS: Target date: 06/25/2023    Patient will report at least 75% improvement in symptoms for improved quality of life. Baseline:  Goal status: MET  2.  Patient will improve FOTO score by at least 9 points in order to indicate improved tolerance to activity. Baseline: 53% function 06/18/23: 53% function 53% 12/09 11/20/23: 53 Goal status: INITIAL  3.  Patient will demonstrate at least 25% improvement in lumbar ROM in all restricted planes for improved ability to move trunk while completing chores. Baseline:  Goal status: MET  4.  Patient will report improved confidence with gait, balance, and strength in roder to return to general exercise routine. Baseline:  Goal status: improving 2/4 11/20/23 feels balance is better, less fearful of falling, feels stronger. Not ready to go to gym yet due to fear  of injury  5.  Patient will demonstrate 5lb increase in muscle testing in all tested musculature as evidence of improved  strength to assist with stair ambulation and gait.   Baseline: see above Goal status: MET     PLAN:  PT FREQUENCY: 1-2x/week  PT DURATION: 8 weeks  PLANNED INTERVENTIONS: Therapeutic exercises, Therapeutic activity, Neuromuscular re-education, Balance training, Gait training, Patient/Family education, Joint manipulation, Joint mobilization, Stair training, Orthotic/Fit training, DME instructions, Aquatic Therapy, Dry Needling, Electrical stimulation, Spinal manipulation, Spinal mobilization, Cryotherapy, Moist heat, Compression bandaging, scar mobilization, Splintting, Taping, Traction, Ultrasound, Ionotophoresis 4mg /ml Dexamethasone, and Manual therapy  PLAN FOR NEXT SESSION: test balance, hip strength, functional strength, lumbar mobility, postural and core strength    Wyman Songster, PT 12/09/2023, 1:52 PM    Referring diagnosis? M54.42 (ICD-10-CM) - Left-sided low back pain with left-sided sciatica, unspecified chronicity Treatment diagnosis? (if different than referring diagnosis) M54.59 What was this (referring dx) caused by? []  Surgery [x]  Fall [x]  Ongoing issue []  Arthritis []  Other: ____________  Laterality: []  Rt []  Lt [x]  Both  Check all possible CPT codes:  *CHOOSE 10 OR LESS*    []  97110 (Therapeutic Exercise)  []  92507 (SLP Treatment)  []  97112 (Neuro Re-ed)   []  92526 (Swallowing Treatment)   a 40981 (Gait Training)   []  [id number] (Cognitive Training, 1st 15 minutes) []  97140 (Manual Therapy)   []  97130 (Cognitive Training, each add'l 15 minutes)  []  97164 (Re-evaluation)                              []  Other, List CPT Code ____________  []  97530 (Therapeutic Activities)     []  97535 (Self Care)   [x]  All codes above (97110 - 97535)  []  19147 (Mechanical Traction)  []  97014 (E-stim Unattended)  []  97032 (E-stim manual)  []  97033 (Ionto)  []  97035 (Ultrasound) []  97750 (Physical Performance Training) []  [id number] (Aquatic Therapy) []  97016 (Vasopneumatic  Device) []  [id number] (Paraffin) []  97034 (Contrast Bath) []  97597 (Wound Care 1st 20 sq cm) []  97598 (Wound Care each add'l 20 sq cm) []  97760 (Orthotic Fabrication, Fitting, Training Initial) []  [id number] (Prosthetic Management and Training Initial) []  [id number] (Orthotic or Prosthetic Training/ Modification Subsequent)

## 2023-12-15 DIAGNOSIS — M1612 Unilateral primary osteoarthritis, left hip: Secondary | ICD-10-CM | POA: Diagnosis not present

## 2023-12-15 DIAGNOSIS — M25551 Pain in right hip: Secondary | ICD-10-CM | POA: Diagnosis not present

## 2023-12-17 ENCOUNTER — Encounter (HOSPITAL_BASED_OUTPATIENT_CLINIC_OR_DEPARTMENT_OTHER): Admitting: Physical Therapy

## 2023-12-24 ENCOUNTER — Encounter (HOSPITAL_BASED_OUTPATIENT_CLINIC_OR_DEPARTMENT_OTHER): Admitting: Physical Therapy

## 2023-12-31 ENCOUNTER — Ambulatory Visit (HOSPITAL_BASED_OUTPATIENT_CLINIC_OR_DEPARTMENT_OTHER): Admitting: Physical Therapy

## 2023-12-31 ENCOUNTER — Encounter (HOSPITAL_BASED_OUTPATIENT_CLINIC_OR_DEPARTMENT_OTHER): Payer: Self-pay | Admitting: Physical Therapy

## 2023-12-31 DIAGNOSIS — M5459 Other low back pain: Secondary | ICD-10-CM

## 2023-12-31 DIAGNOSIS — R29898 Other symptoms and signs involving the musculoskeletal system: Secondary | ICD-10-CM

## 2023-12-31 DIAGNOSIS — M6281 Muscle weakness (generalized): Secondary | ICD-10-CM

## 2023-12-31 DIAGNOSIS — R2689 Other abnormalities of gait and mobility: Secondary | ICD-10-CM | POA: Diagnosis not present

## 2023-12-31 NOTE — Therapy (Signed)
 OUTPATIENT PHYSICAL THERAPY TREATMENT      Patient Name: Joanne Mendoza MRN: [id number] DOB:1945-09-08, 78 y.o., female Today's Date: 12/31/2023   END OF SESSION:  PT End of Session - 12/31/23 1519     Visit Number 36    Number of Visits 52    Date for PT Re-Evaluation 01/15/24    Authorization Type [employer]    Authorization Time Period 10 visits approvedFrom 03.30.2025 - 05.24.2025    Authorization - Visit Number 3    Authorization - Number of Visits 10    Progress Note Due on Visit 31    PT Start Time 1518    PT Stop Time 1558    PT Time Calculation (min) 40 min    Activity Tolerance Patient tolerated treatment well    Behavior During Therapy [facility] for tasks assessed/performed                   Past Medical History:  Diagnosis Date   Hyperlipidemia    Hypertension    Past Surgical History:  Procedure Laterality Date   broken arm     CESAREAN SECTION     x 2    Patient Active Problem List   Diagnosis Date Noted   Hyperlipidemia associated with type 2 diabetes mellitus (HCC) 10/02/2018   White coat hypertension 09/07/2013   Severe obesity (BMI >= 40) (HCC) 05/21/2013   Type 2 diabetes mellitus with hyperglycemia (HCC) 05/20/2013   Essential hypertension 12/12/2008   NONSPECIFIC ABN FINDING RAD & OTH EXAM GU ORGAN 12/12/2008    PCP: Marquetta Sit, MD  REFERRING PROVIDER: Marquetta Sit, MD  REFERRING DIAG: [phone] (ICD-10-CM) - Left-sided low back pain with left-sided sciatica, unspecified chronicity  Rationale for Evaluation and Treatment: Rehabilitation  THERAPY DIAG:  Other abnormalities of gait and mobility  Muscle weakness (generalized)  Other low back pain  Other symptoms and signs involving the musculoskeletal system  ONSET DATE: chronic  SUBJECTIVE:                                                                                                                                                                                            SUBJECTIVE STATEMENT: Went to [facility] with family. Ortho said needs hip replacement. Said she can get a shot that may help. Going to hold off on THA due to timing.   EVAL: Patient states used to be more mobile but has decreased over last few years. Had a fall earlier this year when walking [place]  [employer]. Some swelling and numbness in R knee but has been getting better. Some left leg  issues as well. Patient states some back aches and pains. Sciatic symptoms L hip/low back to L knee. Is afraid of falling so she limits activity which makes it worse, caught up in cycle.   PERTINENT HISTORY:  HTN, HLD, DM  PAIN:  Are you having pain? None at rest, 5-8/10 with activity.   PRECAUTIONS: None  WEIGHT BEARING RESTRICTIONS: No  FALLS:  Has patient fallen in last 6 months? Yes. Number of falls 1   PLOF: Independent  PATIENT GOALS: get more active again and feel better    OBJECTIVE: (objective measures from initial evaluation unless otherwise dated)  PATIENT SURVEYS:  FOTO 53% function 06/18/23: 53% function 12/9:54% 2/4:53% 11/20/23: 53 % function  SCREENING FOR RED FLAGS: Bowel or bladder incontinence: No Spinal tumors: No Cauda equina syndrome: No Compression fracture: No Abdominal aneurysm: No  COGNITION: Overall cognitive status: Within functional limits for tasks assessed     SENSATION: WFL   POSTURE: rounded shoulders, forward head, decreased lumbar lordosis, anterior pelvic tilt, and flexed trunk   PALPATION: Mild ttp lumbar paraspinals, L glutes  LUMBAR ROM:   AROM eval 12/9   Flexion 0% limited 0% limited   Extension 75% limited 50% limited 0% No pain   Right lateral flexion 25% limited 0% limited   Left lateral flexion 25% limited 0% limited   Right rotation     Left rotation      (Blank rows = not tested) * = pain/symptoms  LOWER EXTREMITY ROM:     Passive  Right eval Left eval R 2/4 L 2/4  Hip flexion      Hip extension       Hip abduction      Hip adduction      Hip internal rotation      Hip external rotation   30active 44passive, 30active  Knee flexion      Knee extension      Ankle dorsiflexion      Ankle plantarflexion      Ankle inversion      Ankle eversion       (Blank rows = not tested) * = pain/symptoms  LOWER EXTREMITY MMT:    MMT Right eval Left eval Right 05/14/23 Left  05/14/23 Right 10/16 Left 06/18/23 Right 12/09 Left 12/09 Right 2/4 Left 2/4 Right 11/20/23 Left 11/20/23  Hip flexion 40.5 39.1   55.4 54.1 39.9 37.1 64.5 59.4 52 54  Hip extension   4/5 4/5 4/5 4/5        Hip abduction 43.0 41.4 4-/5 4/5 72.9/ 4-/5 60.5/ 4-/5 43.7 36.5 43.1 25.7! 57.9 59.2  Hip adduction              Hip internal rotation              Hip external rotation              Knee flexion              Knee extension 73.3 66.9   82 68.3 26.7 43.3 49.7 43.5 61.8 65  Ankle dorsiflexion              Ankle plantarflexion              Ankle inversion              Ankle eversion               (Blank rows = not tested) * = pain/symptoms   FUNCTIONAL TESTS:  5 times sit to stand: 9.53 seconds without UE support, mild unsteadiness no loss of balance   12/9 9 seconds      GAIT: Distance walked: 80 feet Assistive device utilized: None Level of assistance: Complete Independence Comments: forward trunk, decreased foot clearance, bilateral glute weakness  Gait 11/20/23  TODAY'S TREATMENT:                                                                                                                              DATE:  12/31/23 Discussion of POC, hip symptoms Standing march on airex 2x20 Lateral step up and over on airex  2 x 10     12/09/23 Alternating march 1 x 20 Standing march on airex 2x20 Lateral step up and over on airex  Squats at rail 3x10 Mini squat lateral slide outs 2 x 10 Mini squat retro slide outs 2 x 10 Hip hikes off 4" step 1x20ea   12/05/23 Alternating march 1 x 20 Squats at  rail 3x10 Standing march on airex 2x20 Lateral step up 6 inch 2 x 10  Hip hikes off 4" step 2x20ea  Standing hip abduction RTB 3x15ea Modified tandem stance 2 x 30 second holds ea  11/25/23 LTR x20ea Bridge 2x15 3 seconds hold Supine SLR 2x10ea Supine active hamstring stretch 10 x 5-10 second holds STS with green TB at thighs 3x10 Squats at rail 3x10 Standing hip abduction RTB 3x15ea Modified tandem stance 2 x 30 second holds ea Standing march on airex 2x20 Hip hikes off 4" step 2x20ea   11/20/23 Reassessment and discussion of POC   11/14/23 LTR x20ea Bridge 2x15 3 seconds hold Supine SLR 1x10ea Supine active hamstring stretch 10 x 5-10 second holds STS with green TB at thighs 3x10 Squats at rail 3x10 Standing hip abduction RTB 3x15ea Modified tandem stance 2 x 20 second holds ea Standing march on airex 2x20   11/11/23 LTR x15ea Bridge 2x15 Supine SLR 2x5 R, 1x5 L Supine active hamstring stretch 10 x 5-10 second holds Squats at rail 3x10 Standing hip abduction RTB 3x15ea Modified tandem stance 2 x 20 second holds ea  11/07/23 Supine SLR 2x10ea Supine hamstring isometrics 20 x 10 second holds Supine active hamstring stretch 10 x 5-10 second holds Squats at rail 2x15 Lateral step up Standing hip abduction RTB 3x15ea Standing march on airex-single UE on rail 2x10 Modified tandem stance 2 x 20 second holds   11/04/2023: Blank lines following charge title = not provided on this treatment date.   Manual:  TPDN No  There-ex: Supine active hamstring stretch 10 x 5-10 second holds Standing hip abduction RTB 3x15ea There-Act: Bridges 3x10 Squats at rail 3x10 Step ups at 6" step x10 leading with L (single UE support). Nuro-Re-ed: Hip hike off 4" step 2x20bil Supine SLR 2x10ea Standing march on airex-single UE on rail x10 Modified tandem stance    10/31/23 Leg press 10# 2 x 10 Hamstring curl machine 10# 2  x 10 Supine hamstring isometrics 20 x 10 second  holds Supine active hamstring stretch 10 x 5-10 second holds Standing hip hike 2 x 10  10/28/2023: Blank lines following charge title = not provided on this treatment date.   Manual:  TPDN No  There-ex: LTR x20 S/l clams GTB 2x15 3sec hold Standing hip abduction GTB 3x10ea There-Act: Bridges 3x10 Squats at rail 3x10 Nuro-Re-ed: Hip hike off 4" step 2x20bil Supine marching with GTB x30 Supine SLR 2x10ea, then 1x5 LAQ 4# 5" hold 3x10   10/24/2023: Blank lines following charge title = not provided on this treatment date.   Manual:  TPDN No  There-ex: LTR x20 S/l clams GTB 2x15 3sec hold Seated HSS 2x30sec each Standing hip abduction GTB 3x10ea There-Act: Bridges 3x10 Squats at rail 3x10 Nuro-Re-ed: Hip hike off 4" step 2x20bil Supine marching with GTB x30 Supine SLR 2x10ea   10/21/23 Bridge 1 x 10  Bridge with clam GTB 2 x 10  PPT with march GTB at feet 2 x 10 SLS on airex with UE support 3 x 30 second holds Hip hike on airex 1 x 8 Palof press with PPT GTB 2 x 10 Squat 2 x 10   PATIENT EDUCATION:  Education details: Patient educated on exam findings, POC, scope of PT, HEP. 05/14/23: HEP Person educated: Patient Education method: Programmer, multimedia, Demonstration, and Handouts Education comprehension: verbalized understanding, returned demonstration, verbal cues required, and tactile cues required  HOME EXERCISE PROGRAM: Access Code: [email or web address] URL: [email or web address]   ASSESSMENT:  CLINICAL IMPRESSION: Patient with questions regarding POC in relation to upcoming hip replacement. Following discussion, patient will continue with POC for further HEP development and will transition to self management following current POC. Continued with glute strengthening and balance training. Patient will continue to benefit from physical therapy in order to improve function and reduce impairment.    OBJECTIVE IMPAIRMENTS: Abnormal gait, decreased activity  tolerance, decreased balance, decreased endurance, decreased mobility, difficulty walking, decreased ROM, decreased strength, increased muscle spasms, impaired flexibility, improper body mechanics, postural dysfunction, and pain.   ACTIVITY LIMITATIONS: carrying, lifting, bending, standing, squatting, transfers, hygiene/grooming, locomotion level, and caring for others  PARTICIPATION LIMITATIONS: meal prep, cleaning, laundry, shopping, community activity, and yard work  PERSONAL FACTORS: Fitness, Time since onset of injury/illness/exacerbation, and 3+ comorbidities: HTN, HLD, DM, hx back pain  are also affecting patient's functional outcome.   REHAB POTENTIAL: Good  CLINICAL DECISION MAKING: Stable/uncomplicated  EVALUATION COMPLEXITY: Low   GOALS: Goals reviewed with patient? Yes  SHORT TERM GOALS: Target date: 05/28/2023    Patient will be independent with HEP in order to improve functional outcomes. Baseline:  Goal status: MET (9/30)  2.  Patient will report at least 25% improvement in symptoms for improved quality of life. Baseline:  Goal status: MET  3. Patient will increase left hip ER to full without pain  Goal status: IN PROGRESS (30deg on 2/4)  4. Patient will increase right quad strength by 10 lbs  Goal status: IN PROGRESS   LONG TERM GOALS: Target date: 06/25/2023    Patient will report at least 75% improvement in symptoms for improved quality of life. Baseline:  Goal status: MET  2.  Patient will improve FOTO score by at least 9 points in order to indicate improved tolerance to activity. Baseline: 53% function 06/18/23: 53% function 53% 12/09 11/20/23: 53 Goal status: INITIAL  3.  Patient will demonstrate at least 25% improvement in lumbar ROM in all restricted planes for improved ability to  move trunk while completing chores. Baseline:  Goal status: MET  4.  Patient will report improved confidence with gait, balance, and strength in roder to return to  general exercise routine. Baseline:  Goal status: improving 2/4 11/20/23 feels balance is better, less fearful of falling, feels stronger. Not ready to go to gym yet due to fear of injury  5.  Patient will demonstrate 5lb increase in muscle testing in all tested musculature as evidence of improved strength to assist with stair ambulation and gait.   Baseline: see above Goal status: MET     PLAN:  PT FREQUENCY: 1-2x/week  PT DURATION: 8 weeks  PLANNED INTERVENTIONS: Therapeutic exercises, Therapeutic activity, Neuromuscular re-education, Balance training, Gait training, Patient/Family education, Joint manipulation, Joint mobilization, Stair training, Orthotic/Fit training, DME instructions, Aquatic Therapy, Dry Needling, Electrical stimulation, Spinal manipulation, Spinal mobilization, Cryotherapy, Moist heat, Compression bandaging, scar mobilization, Splintting, Taping, Traction, Ultrasound, Ionotophoresis 4mg /ml Dexamethasone, and Manual therapy  PLAN FOR NEXT SESSION: test balance, hip strength, functional strength, lumbar mobility, postural and core strength    Beather Liming, PT 12/31/2023, 3:20 PM    Referring diagnosis? M54.42 (ICD-10-CM) - Left-sided low back pain with left-sided sciatica, unspecified chronicity Treatment diagnosis? (if different than referring diagnosis) M54.59 What was this (referring dx) caused by? []  Surgery [x]  Fall [x]  Ongoing issue []  Arthritis []  Other: ____________  Laterality: []  Rt []  Lt [x]  Both  Check all possible CPT codes:  *CHOOSE 10 OR LESS*    []  97110 (Therapeutic Exercise)  []  92507 (SLP Treatment)  []  97112 (Neuro Re-ed)   []  92526 (Swallowing Treatment)   a 09811 (Gait Training)   []  [id number] (Cognitive Training, 1st 15 minutes) []  97140 (Manual Therapy)   []  97130 (Cognitive Training, each add'l 15 minutes)  []  97164 (Re-evaluation)                              []  Other, List CPT Code ____________  []  97530 (Therapeutic  Activities)     []  97535 (Self Care)   [x]  All codes above (97110 - 97535)  []  97012 (Mechanical Traction)  []  97014 (E-stim Unattended)  []  97032 (E-stim manual)  []  97033 (Ionto)  []  97035 (Ultrasound) []  97750 (Physical Performance Training) []  [id number] (Aquatic Therapy) []  97016 (Vasopneumatic Device) []  [id number] (Paraffin) []  97034 (Contrast Bath) []  97597 (Wound Care 1st 20 sq cm) []  97598 (Wound Care each add'l 20 sq cm) []  97760 (Orthotic Fabrication, Fitting, Training Initial) []  91478 (Prosthetic Management and Training Initial) []  [id number] (Orthotic or Prosthetic Training/ Modification Subsequent)

## 2024-01-07 ENCOUNTER — Ambulatory Visit (HOSPITAL_BASED_OUTPATIENT_CLINIC_OR_DEPARTMENT_OTHER): Attending: Family Medicine | Admitting: Physical Therapy

## 2024-01-07 ENCOUNTER — Encounter (HOSPITAL_BASED_OUTPATIENT_CLINIC_OR_DEPARTMENT_OTHER): Payer: Self-pay | Admitting: Physical Therapy

## 2024-01-07 DIAGNOSIS — M6281 Muscle weakness (generalized): Secondary | ICD-10-CM | POA: Diagnosis not present

## 2024-01-07 DIAGNOSIS — R29898 Other symptoms and signs involving the musculoskeletal system: Secondary | ICD-10-CM | POA: Insufficient documentation

## 2024-01-07 DIAGNOSIS — M5459 Other low back pain: Secondary | ICD-10-CM | POA: Diagnosis not present

## 2024-01-07 DIAGNOSIS — R2689 Other abnormalities of gait and mobility: Secondary | ICD-10-CM | POA: Diagnosis not present

## 2024-01-07 NOTE — Therapy (Signed)
 OUTPATIENT PHYSICAL THERAPY TREATMENT      Patient Name: Joanne Mendoza MRN: [id number] DOB:07/13/1946, 78 y.o., female Today's Date: 01/07/2024   END OF SESSION:  PT End of Session - 01/07/24 1438     Visit Number 37    Number of Visits 52    Date for PT Re-Evaluation 01/15/24    Authorization Type [employer]    Authorization Time Period 10 visits approvedFrom 03.30.2025 - 05.24.2025    Authorization - Visit Number 4    Authorization - Number of Visits 10    Progress Note Due on Visit 31    PT Start Time 1438    PT Stop Time 1516    PT Time Calculation (min) 38 min    Activity Tolerance Patient tolerated treatment well    Behavior During Therapy WFL for tasks assessed/performed                   Past Medical History:  Diagnosis Date   Hyperlipidemia    Hypertension    Past Surgical History:  Procedure Laterality Date   broken arm     CESAREAN SECTION     x 2    Patient Active Problem List   Diagnosis Date Noted   Hyperlipidemia associated with type 2 diabetes mellitus (HCC) 10/02/2018   White coat hypertension 09/07/2013   Severe obesity (BMI >= 40) (HCC) 05/21/2013   Type 2 diabetes mellitus with hyperglycemia (HCC) 05/20/2013   Essential hypertension 12/12/2008   NONSPECIFIC ABN FINDING RAD & OTH EXAM GU ORGAN 12/12/2008    PCP: Marquetta Sit, MD  REFERRING PROVIDER: Marquetta Sit, MD  REFERRING DIAG: [id number] (ICD-10-CM) - Left-sided low back pain with left-sided sciatica, unspecified chronicity  Rationale for Evaluation and Treatment: Rehabilitation  THERAPY DIAG:  Other abnormalities of gait and mobility  Muscle weakness (generalized)  Other low back pain  Other symptoms and signs involving the musculoskeletal system  ONSET DATE: chronic  SUBJECTIVE:                                                                                                                                                                                            SUBJECTIVE STATEMENT: Doing HEP. Everything going well. Might have surgery sooner and not put it off.   EVAL: Patient states used to be more mobile but has decreased over last few years. Had a fall earlier this year when walking [place]. Some swelling and numbness in R knee but has been getting better. Some left leg issues as well. Patient states some back aches and pains. Sciatic symptoms L hip/low  back to L knee. Is afraid of falling so she limits activity which makes it worse, caught up in cycle.   PERTINENT HISTORY:  HTN, HLD, DM  PAIN:  Are you having pain? None at rest, 5-8/10 with activity.   PRECAUTIONS: None  WEIGHT BEARING RESTRICTIONS: No  FALLS:  Has patient fallen in last 6 months? Yes. Number of falls 1   PLOF: Independent  PATIENT GOALS: get more active again and feel better    OBJECTIVE: (objective measures from initial evaluation unless otherwise dated)  PATIENT SURVEYS:  FOTO 53% function 06/18/23: 53% function 12/9:54% 2/4:53% 11/20/23: 53 % function  SCREENING FOR RED FLAGS: Bowel or bladder incontinence: No Spinal tumors: No Cauda equina syndrome: No Compression fracture: No Abdominal aneurysm: No  COGNITION: Overall cognitive status: Within functional limits for tasks assessed     SENSATION: WFL   POSTURE: rounded shoulders, forward head, decreased lumbar lordosis, anterior pelvic tilt, and flexed trunk   PALPATION: Mild ttp lumbar paraspinals, L glutes  LUMBAR ROM:   AROM eval 12/9   Flexion 0% limited 0% limited   Extension 75% limited 50% limited 0% No pain   Right lateral flexion 25% limited 0% limited   Left lateral flexion 25% limited 0% limited   Right rotation     Left rotation      (Blank rows = not tested) * = pain/symptoms  LOWER EXTREMITY ROM:     Passive  Right eval Left eval R 2/4 L 2/4  Hip flexion      Hip extension      Hip abduction      Hip adduction      Hip internal  rotation      Hip external rotation   30active 44passive, 30active  Knee flexion      Knee extension      Ankle dorsiflexion      Ankle plantarflexion      Ankle inversion      Ankle eversion       (Blank rows = not tested) * = pain/symptoms  LOWER EXTREMITY MMT:    MMT Right eval Left eval Right 05/14/23 Left  05/14/23 Right 10/16 Left 06/18/23 Right 12/09 Left 12/09 Right 2/4 Left 2/4 Right 11/20/23 Left 11/20/23  Hip flexion 40.5 39.1   55.4 54.1 39.9 37.1 64.5 59.4 52 54  Hip extension   4/5 4/5 4/5 4/5        Hip abduction 43.0 41.4 4-/5 4/5 72.9/ 4-/5 60.5/ 4-/5 43.7 36.5 43.1 25.7! 57.9 59.2  Hip adduction              Hip internal rotation              Hip external rotation              Knee flexion              Knee extension 73.3 66.9   82 68.3 26.7 43.3 49.7 43.5 61.8 65  Ankle dorsiflexion              Ankle plantarflexion              Ankle inversion              Ankle eversion               (Blank rows = not tested) * = pain/symptoms   FUNCTIONAL TESTS:  5 times sit to stand: 9.53 seconds without UE support, mild unsteadiness no loss  of balance   12/9 9 seconds      GAIT: Distance walked: 80 feet Assistive device utilized: None Level of assistance: Complete Independence Comments: forward trunk, decreased foot clearance, bilateral glute weakness  Gait 11/20/23  TODAY'S TREATMENT:                                                                                                                              DATE:  01/07/24 Standing march on airex 2x20 Review of HEP and condensing of exercises Lateral stepping in mini squat 4 x 12 feet Mini squat retro slide outs 2 x 10   12/31/23 Discussion of POC, hip symptoms Standing march on airex 2x20 Lateral step up and over on airex  2 x 10     12/09/23 Alternating march 1 x 20 Standing march on airex 2x20 Lateral step up and over on airex  Squats at rail 3x10 Mini squat lateral slide outs 2 x  10 Mini squat retro slide outs 2 x 10 Hip hikes off 4" step 1x20ea   12/05/23 Alternating march 1 x 20 Squats at rail 3x10 Standing march on airex 2x20 Lateral step up 6 inch 2 x 10  Hip hikes off 4" step 2x20ea  Standing hip abduction RTB 3x15ea Modified tandem stance 2 x 30 second holds ea  11/25/23 LTR x20ea Bridge 2x15 3 seconds hold Supine SLR 2x10ea Supine active hamstring stretch 10 x 5-10 second holds STS with green TB at thighs 3x10 Squats at rail 3x10 Standing hip abduction RTB 3x15ea Modified tandem stance 2 x 30 second holds ea Standing march on airex 2x20 Hip hikes off 4" step 2x20ea   11/20/23 Reassessment and discussion of POC   11/14/23 LTR x20ea Bridge 2x15 3 seconds hold Supine SLR 1x10ea Supine active hamstring stretch 10 x 5-10 second holds STS with green TB at thighs 3x10 Squats at rail 3x10 Standing hip abduction RTB 3x15ea Modified tandem stance 2 x 20 second holds ea Standing march on airex 2x20   11/11/23 LTR x15ea Bridge 2x15 Supine SLR 2x5 R, 1x5 L Supine active hamstring stretch 10 x 5-10 second holds Squats at rail 3x10 Standing hip abduction RTB 3x15ea Modified tandem stance 2 x 20 second holds ea  11/07/23 Supine SLR 2x10ea Supine hamstring isometrics 20 x 10 second holds Supine active hamstring stretch 10 x 5-10 second holds Squats at rail 2x15 Lateral step up Standing hip abduction RTB 3x15ea Standing march on airex-single UE on rail 2x10 Modified tandem stance 2 x 20 second holds   11/04/2023: Blank lines following charge title = not provided on this treatment date.   Manual:  TPDN No  There-ex: Supine active hamstring stretch 10 x 5-10 second holds Standing hip abduction RTB 3x15ea There-Act: Bridges 3x10 Squats at rail 3x10 Step ups at 6" step x10 leading with L (single UE support). Nuro-Re-ed: Hip hike off 4" step 2x20bil Supine SLR 2x10ea Standing march on airex-single UE on rail x10  Modified tandem  stance    10/31/23 Leg press 10# 2 x 10 Hamstring curl machine 10# 2 x 10 Supine hamstring isometrics 20 x 10 second holds Supine active hamstring stretch 10 x 5-10 second holds Standing hip hike 2 x 10  10/28/2023: Blank lines following charge title = not provided on this treatment date.   Manual:  TPDN No  There-ex: LTR x20 S/l clams GTB 2x15 3sec hold Standing hip abduction GTB 3x10ea There-Act: Bridges 3x10 Squats at rail 3x10 Nuro-Re-ed: Hip hike off 4" step 2x20bil Supine marching with GTB x30 Supine SLR 2x10ea, then 1x5 LAQ 4# 5" hold 3x10   10/24/2023: Blank lines following charge title = not provided on this treatment date.   Manual:  TPDN No  There-ex: LTR x20 S/l clams GTB 2x15 3sec hold Seated HSS 2x30sec each Standing hip abduction GTB 3x10ea There-Act: Bridges 3x10 Squats at rail 3x10 Nuro-Re-ed: Hip hike off 4" step 2x20bil Supine marching with GTB x30 Supine SLR 2x10ea   10/21/23 Bridge 1 x 10  Bridge with clam GTB 2 x 10  PPT with march GTB at feet 2 x 10 SLS on airex with UE support 3 x 30 second holds Hip hike on airex 1 x 8 Palof press with PPT GTB 2 x 10 Squat 2 x 10   PATIENT EDUCATION:  Education details: Patient educated on exam findings, POC, scope of PT, HEP. 05/14/23: HEP Person educated: Patient Education method: Programmer, multimedia, Demonstration, and Handouts Education comprehension: verbalized understanding, returned demonstration, verbal cues required, and tactile cues required  HOME EXERCISE PROGRAM: Access Code: [email or web address] URL: [email or web address]   ASSESSMENT:  CLINICAL IMPRESSION: Patient with questions regarding POC in relation to upcoming hip replacement. Following discussion, patient will continue with POC for further HEP development and will transition to self management following current POC. Continued with glute strengthening and balance training. Patient will continue to benefit from physical  therapy in order to improve function and reduce impairment.    OBJECTIVE IMPAIRMENTS: Abnormal gait, decreased activity tolerance, decreased balance, decreased endurance, decreased mobility, difficulty walking, decreased ROM, decreased strength, increased muscle spasms, impaired flexibility, improper body mechanics, postural dysfunction, and pain.   ACTIVITY LIMITATIONS: carrying, lifting, bending, standing, squatting, transfers, hygiene/grooming, locomotion level, and caring for others  PARTICIPATION LIMITATIONS: meal prep, cleaning, laundry, shopping, community activity, and yard work  PERSONAL FACTORS: Fitness, Time since onset of injury/illness/exacerbation, and 3+ comorbidities: HTN, HLD, DM, hx back pain  are also affecting patient's functional outcome.   REHAB POTENTIAL: Good  CLINICAL DECISION MAKING: Stable/uncomplicated  EVALUATION COMPLEXITY: Low   GOALS: Goals reviewed with patient? Yes  SHORT TERM GOALS: Target date: 05/28/2023    Patient will be independent with HEP in order to improve functional outcomes. Baseline:  Goal status: MET (9/30)  2.  Patient will report at least 25% improvement in symptoms for improved quality of life. Baseline:  Goal status: MET  3. Patient will increase left hip ER to full without pain  Goal status: IN PROGRESS (30deg on 2/4)  4. Patient will increase right quad strength by 10 lbs  Goal status: IN PROGRESS   LONG TERM GOALS: Target date: 06/25/2023    Patient will report at least 75% improvement in symptoms for improved quality of life. Baseline:  Goal status: MET  2.  Patient will improve FOTO score by at least 9 points in order to indicate improved tolerance to activity. Baseline: 53% function 06/18/23: 53% function 53% 12/09 11/20/23: 53 Goal status: INITIAL  3.  Patient will demonstrate at least 25% improvement in lumbar ROM in all restricted planes for improved ability to move trunk while completing chores. Baseline:   Goal status: MET  4.  Patient will report improved confidence with gait, balance, and strength in roder to return to general exercise routine. Baseline:  Goal status: improving 2/4 11/20/23 feels balance is better, less fearful of falling, feels stronger. Not ready to go to gym yet due to fear of injury  5.  Patient will demonstrate 5lb increase in muscle testing in all tested musculature as evidence of improved strength to assist with stair ambulation and gait.   Baseline: see above Goal status: MET     PLAN:  PT FREQUENCY: 1-2x/week  PT DURATION: 8 weeks  PLANNED INTERVENTIONS: Therapeutic exercises, Therapeutic activity, Neuromuscular re-education, Balance training, Gait training, Patient/Family education, Joint manipulation, Joint mobilization, Stair training, Orthotic/Fit training, DME instructions, Aquatic Therapy, Dry Needling, Electrical stimulation, Spinal manipulation, Spinal mobilization, Cryotherapy, Moist heat, Compression bandaging, scar mobilization, Splintting, Taping, Traction, Ultrasound, Ionotophoresis 4mg /ml Dexamethasone, and Manual therapy  PLAN FOR NEXT SESSION: test balance, hip strength, functional strength, lumbar mobility, postural and core strength    Beather Liming, PT 01/07/2024, 3:18 PM    Referring diagnosis? M54.42 (ICD-10-CM) - Left-sided low back pain with left-sided sciatica, unspecified chronicity Treatment diagnosis? (if different than referring diagnosis) M54.59 What was this (referring dx) caused by? []  Surgery [x]  Fall [x]  Ongoing issue []  Arthritis []  Other: ____________  Laterality: []  Rt []  Lt [x]  Both  Check all possible CPT codes:  *CHOOSE 10 OR LESS*    []  97110 (Therapeutic Exercise)  []  92507 (SLP Treatment)  []  97112 (Neuro Re-ed)   []  92526 (Swallowing Treatment)   a [id number] (Gait Training)   []  [id number] (Cognitive Training, 1st 15 minutes) []  97140 (Manual Therapy)   []  97130 (Cognitive Training, each add'l 15  minutes)  []  97164 (Re-evaluation)                              []  Other, List CPT Code ____________  []  97530 (Therapeutic Activities)     []  97535 (Self Care)   [x]  All codes above (97110 - 97535)  []  97012 (Mechanical Traction)  []  97014 (E-stim Unattended)  []  97032 (E-stim manual)  []  97033 (Ionto)  []  97035 (Ultrasound) []  97750 (Physical Performance Training) []  [id number] (Aquatic Therapy) []  97016 (Vasopneumatic Device) []  [id number] (Paraffin) []  97034 (Contrast Bath) []  97597 (Wound Care 1st 20 sq cm) []  97598 (Wound Care each add'l 20 sq cm) []  97760 (Orthotic Fabrication, Fitting, Training Initial) []  [id number] (Prosthetic Management and Training Initial) []  [id number] (Orthotic or Prosthetic Training/ Modification Subsequent)

## 2024-01-14 ENCOUNTER — Ambulatory Visit (HOSPITAL_BASED_OUTPATIENT_CLINIC_OR_DEPARTMENT_OTHER): Admitting: Physical Therapy

## 2024-01-14 ENCOUNTER — Encounter (HOSPITAL_BASED_OUTPATIENT_CLINIC_OR_DEPARTMENT_OTHER): Payer: Self-pay | Admitting: Physical Therapy

## 2024-01-14 DIAGNOSIS — M6281 Muscle weakness (generalized): Secondary | ICD-10-CM

## 2024-01-14 DIAGNOSIS — M5459 Other low back pain: Secondary | ICD-10-CM

## 2024-01-14 DIAGNOSIS — R29898 Other symptoms and signs involving the musculoskeletal system: Secondary | ICD-10-CM | POA: Diagnosis not present

## 2024-01-14 DIAGNOSIS — R2689 Other abnormalities of gait and mobility: Secondary | ICD-10-CM

## 2024-01-14 NOTE — Therapy (Signed)
 OUTPATIENT PHYSICAL THERAPY TREATMENT      Patient Name: Joanne Mendoza MRN: [id number] DOB:27-Sep-1945, 78 y.o., female Today's Date: 01/14/2024   END OF SESSION:  PT End of Session - 01/14/24 1433     Visit Number 38    Number of Visits 52    Date for PT Re-Evaluation 01/15/24    Authorization Type [employer]    Authorization Time Period 10 visits approvedFrom 03.30.2025 - 05.24.2025    Authorization - Visit Number 5    Authorization - Number of Visits 10    Progress Note Due on Visit 31    PT Start Time 1433    PT Stop Time 1513    PT Time Calculation (min) 40 min    Activity Tolerance Patient tolerated treatment well    Behavior During Therapy [facility] for tasks assessed/performed                   Past Medical History:  Diagnosis Date   Hyperlipidemia    Hypertension    Past Surgical History:  Procedure Laterality Date   broken arm     CESAREAN SECTION     x 2    Patient Active Problem List   Diagnosis Date Noted   Hyperlipidemia associated with type 2 diabetes mellitus (HCC) 10/02/2018   White coat hypertension 09/07/2013   Severe obesity (BMI >= 40) (HCC) 05/21/2013   Type 2 diabetes mellitus with hyperglycemia (HCC) 05/20/2013   Essential hypertension 12/12/2008   NONSPECIFIC ABN FINDING RAD & OTH EXAM GU ORGAN 12/12/2008    PCP: Marquetta Sit, MD  REFERRING PROVIDER: Marquetta Sit, MD  REFERRING DIAG: [phone] (ICD-10-CM) - Left-sided low back pain with left-sided sciatica, unspecified chronicity  Rationale for Evaluation and Treatment: Rehabilitation  THERAPY DIAG:  Other abnormalities of gait and mobility  Muscle weakness (generalized)  Other low back pain  Other symptoms and signs involving the musculoskeletal system  ONSET DATE: chronic  SUBJECTIVE:                                                                                                                                                                                            SUBJECTIVE STATEMENT: Was busy with mothers day on feet a lot. Questions about surgery.  EVAL: Patient states used to be more mobile but has decreased over last few years. Had a fall earlier this year when walking [place]  [employer]. Some swelling and numbness in R knee but has been getting better. Some left leg issues as well. Patient states some back aches and pains. Sciatic symptoms L hip/low back to L  knee. Is afraid of falling so she limits activity which makes it worse, caught up in cycle.   PERTINENT HISTORY:  HTN, HLD, DM  PAIN:  Are you having pain? None at rest, 5-8/10 with activity.   PRECAUTIONS: None  WEIGHT BEARING RESTRICTIONS: No  FALLS:  Has patient fallen in last 6 months? Yes. Number of falls 1   PLOF: Independent  PATIENT GOALS: get more active again and feel better    OBJECTIVE: (objective measures from initial evaluation unless otherwise dated)  PATIENT SURVEYS:  FOTO 53% function10/16/24: 53% function 12/9:54% 2/4:53% 11/20/23: 53 % function  SCREENING FOR RED FLAGS: Bowel or bladder incontinence: No Spinal tumors: No Cauda equina syndrome: No Compression fracture: No Abdominal aneurysm: No  COGNITION: Overall cognitive status: Within functional limits for tasks assessed     SENSATION: WFL   POSTURE: rounded shoulders, forward head, decreased lumbar lordosis, anterior pelvic tilt, and flexed trunk   PALPATION: Mild ttp lumbar paraspinals, L glutes  LUMBAR ROM:   AROM eval 12/9   Flexion 0% limited 0% limited   Extension 75% limited 50% limited 0% No pain   Right lateral flexion 25% limited 0% limited   Left lateral flexion 25% limited 0% limited   Right rotation     Left rotation      (Blank rows = not tested) * = pain/symptoms  LOWER EXTREMITY ROM:     Passive  Right eval Left eval R 2/4 L 2/4  Hip flexion      Hip extension      Hip abduction      Hip adduction      Hip internal rotation      Hip  external rotation   30active 44passive, 30active  Knee flexion      Knee extension      Ankle dorsiflexion      Ankle plantarflexion      Ankle inversion      Ankle eversion       (Blank rows = not tested) * = pain/symptoms  LOWER EXTREMITY MMT:    MMT Right eval Left eval Right 05/14/23 Left  05/14/23 Right 10/16 Left 06/18/23 Right 12/09 Left 12/09 Right 2/4 Left 2/4 Right 11/20/23 Left 11/20/23  Hip flexion 40.5 39.1   55.4 54.1 39.9 37.1 64.5 59.4 52 54  Hip extension   4/5 4/5 4/5 4/5        Hip abduction 43.0 41.4 4-/5 4/5 72.9/ 4-/5 60.5/ 4-/5 43.7 36.5 43.1 25.7! 57.9 59.2  Hip adduction              Hip internal rotation              Hip external rotation              Knee flexion              Knee extension 73.3 66.9   82 68.3 26.7 43.3 49.7 43.5 61.8 65  Ankle dorsiflexion              Ankle plantarflexion              Ankle inversion              Ankle eversion               (Blank rows = not tested) * = pain/symptoms   FUNCTIONAL TESTS:  5 times sit to stand: 9.53 seconds without UE support, mild unsteadiness no loss of balance  12/9 9 seconds      GAIT: Distance walked: 80 feet Assistive device utilized: None Level of assistance: Complete Independence Comments: forward trunk, decreased foot clearance, bilateral glute weakness  Gait 11/20/23  TODAY'S TREATMENT:                                                                                                                              DATE:  01/14/24 Discussion of upcoming surgery, rehab, what to expect Standing tricep extension with core activation GTB 2 x 10  Standing hip hike 2 x 10   01/07/24 Standing march on airex 2x20 Review of HEP and condensing of exercises Lateral stepping in mini squat 4 x 12 feet Mini squat retro slide outs 2 x 10   12/31/23 Discussion of POC, hip symptoms Standing march on airex 2x20 Lateral step up and over on airex  2 x 10     12/09/23 Alternating march  1 x 20 Standing march on airex 2x20 Lateral step up and over on airex  Squats at rail 3x10 Mini squat lateral slide outs 2 x 10 Mini squat retro slide outs 2 x 10 Hip hikes off 4" step 1x20ea   12/05/23 Alternating march 1 x 20 Squats at rail 3x10 Standing march on airex 2x20 Lateral step up 6 inch 2 x 10  Hip hikes off 4" step 2x20ea  Standing hip abduction RTB 3x15ea Modified tandem stance 2 x 30 second holds ea  11/25/23 LTR x20ea Bridge 2x15 3 seconds hold Supine SLR 2x10ea Supine active hamstring stretch 10 x 5-10 second holds STS with green TB at thighs 3x10 Squats at rail 3x10 Standing hip abduction RTB 3x15ea Modified tandem stance 2 x 30 second holds ea Standing march on airex 2x20 Hip hikes off 4" step 2x20ea   11/20/23 Reassessment and discussion of POC   11/14/23 LTR x20ea Bridge 2x15 3 seconds hold Supine SLR 1x10ea Supine active hamstring stretch 10 x 5-10 second holds STS with green TB at thighs 3x10 Squats at rail 3x10 Standing hip abduction RTB 3x15ea Modified tandem stance 2 x 20 second holds ea Standing march on airex 2x20   11/11/23 LTR x15ea Bridge 2x15 Supine SLR 2x5 R, 1x5 L Supine active hamstring stretch 10 x 5-10 second holds Squats at rail 3x10 Standing hip abduction RTB 3x15ea Modified tandem stance 2 x 20 second holds ea  11/07/23 Supine SLR 2x10ea Supine hamstring isometrics 20 x 10 second holds Supine active hamstring stretch 10 x 5-10 second holds Squats at rail 2x15 Lateral step up Standing hip abduction RTB 3x15ea Standing march on airex-single UE on rail 2x10 Modified tandem stance 2 x 20 second holds   11/04/2023: Blank lines following charge title = not provided on this treatment date.   Manual:  TPDN No  There-ex: Supine active hamstring stretch 10 x 5-10 second holds Standing hip abduction RTB 3x15ea There-Act: Bridges 3x10 Squats at rail 3x10 Step ups at 6" step x10  leading with L (single UE  support). Nuro-Re-ed: Hip hike off 4" step 2x20bil Supine SLR 2x10ea Standing march on airex-single UE on rail x10 Modified tandem stance    10/31/23 Leg press 10# 2 x 10 Hamstring curl machine 10# 2 x 10 Supine hamstring isometrics 20 x 10 second holds Supine active hamstring stretch 10 x 5-10 second holds Standing hip hike 2 x 10  10/28/2023: Blank lines following charge title = not provided on this treatment date.   Manual:  TPDN No  There-ex: LTR x20 S/l clams GTB 2x15 3sec hold Standing hip abduction GTB 3x10ea There-Act: Bridges 3x10 Squats at rail 3x10 Nuro-Re-ed: Hip hike off 4" step 2x20bil Supine marching with GTB x30 Supine SLR 2x10ea, then 1x5 LAQ 4# 5" hold 3x10   10/24/2023: Blank lines following charge title = not provided on this treatment date.   Manual:  TPDN No  There-ex: LTR x20 S/l clams GTB 2x15 3sec hold Seated HSS 2x30sec each Standing hip abduction GTB 3x10ea There-Act: Bridges 3x10 Squats at rail 3x10 Nuro-Re-ed: Hip hike off 4" step 2x20bil Supine marching with GTB x30 Supine SLR 2x10ea   10/21/23 Bridge 1 x 10  Bridge with clam GTB 2 x 10  PPT with march GTB at feet 2 x 10 SLS on airex with UE support 3 x 30 second holds Hip hike on airex 1 x 8 Palof press with PPT GTB 2 x 10 Squat 2 x 10   PATIENT EDUCATION:  Education details: Patient educated on exam findings, POC, scope of PT, HEP. 05/14/23: HEP Person educated: Patient Education method: Programmer, multimedia, Demonstration, and Handouts Education comprehension: verbalized understanding, returned demonstration, verbal cues required, and tactile cues required  HOME EXERCISE PROGRAM: Access Code: [email or web address] URL: [email or web address]   ASSESSMENT:  CLINICAL IMPRESSION: Patient with questions regarding upcoming hip surgery. Answered questions of what to expect and typical post op procedures. Continued with hip and core strength. Patient will continue to  benefit from physical therapy in order to improve function and reduce impairment.    OBJECTIVE IMPAIRMENTS: Abnormal gait, decreased activity tolerance, decreased balance, decreased endurance, decreased mobility, difficulty walking, decreased ROM, decreased strength, increased muscle spasms, impaired flexibility, improper body mechanics, postural dysfunction, and pain.   ACTIVITY LIMITATIONS: carrying, lifting, bending, standing, squatting, transfers, hygiene/grooming, locomotion level, and caring for others  PARTICIPATION LIMITATIONS: meal prep, cleaning, laundry, shopping, community activity, and yard work  PERSONAL FACTORS: Fitness, Time since onset of injury/illness/exacerbation, and 3+ comorbidities: HTN, HLD, DM, hx back pain are also affecting patient's functional outcome.   REHAB POTENTIAL: Good  CLINICAL DECISION MAKING: Stable/uncomplicated  EVALUATION COMPLEXITY: Low   GOALS: Goals reviewed with patient? Yes  SHORT TERM GOALS: Target date: 05/28/2023    Patient will be independent with HEP in order to improve functional outcomes. Baseline:  Goal status: MET (9/30)  2.  Patient will report at least 25% improvement in symptoms for improved quality of life. Baseline:  Goal status: MET  3. Patient will increase left hip ER to full without pain  Goal status: IN PROGRESS (30deg on 2/4)  4. Patient will increase right quad strength by 10 lbs  Goal status: IN PROGRESS   LONG TERM GOALS: Target date: 06/25/2023    Patient will report at least 75% improvement in symptoms for improved quality of life. Baseline:  Goal status: MET  2.  Patient will improve FOTO score by at least 9 points in order to indicate improved tolerance to activity. Baseline: 53% function 06/18/23: 53% function 53%  12/09 11/20/23: 53 Goal status: INITIAL  3.  Patient will demonstrate at least 25% improvement in lumbar ROM in all restricted planes for improved ability to move trunk while  completing chores. Baseline:  Goal status: MET  4.  Patient will report improved confidence with gait, balance, and strength in roder to return to general exercise routine. Baseline:  Goal status: improving 2/4 11/20/23 feels balance is better, less fearful of falling, feels stronger. Not ready to go to gym yet due to fear of injury  5.  Patient will demonstrate 5lb increase in muscle testing in all tested musculature as evidence of improved strength to assist with stair ambulation and gait.   Baseline: see above Goal status: MET     PLAN:  PT FREQUENCY: 1-2x/week  PT DURATION: 8 weeks  PLANNED INTERVENTIONS: Therapeutic exercises, Therapeutic activity, Neuromuscular re-education, Balance training, Gait training, Patient/Family education, Joint manipulation, Joint mobilization, Stair training, Orthotic/Fit training, DME instructions, Aquatic Therapy, Dry Needling, Electrical stimulation, Spinal manipulation, Spinal mobilization, Cryotherapy, Moist heat, Compression bandaging, scar mobilization, Splintting, Taping, Traction, Ultrasound, Ionotophoresis 4mg /ml Dexamethasone, and Manual therapy  PLAN FOR NEXT SESSION: test balance, hip strength, functional strength, lumbar mobility, postural and core strength    Beather Liming, PT 01/14/2024, 2:34 PM    Referring diagnosis? M54.42 (ICD-10-CM) - Left-sided low back pain with left-sided sciatica, unspecified chronicity Treatment diagnosis? (if different than referring diagnosis) M54.59 What was this (referring dx) caused by? []  Surgery [x]  Fall [x]  Ongoing issue []  Arthritis []  Other: ____________  Laterality: []  Rt []  Lt [x]  Both  Check all possible CPT codes:  *CHOOSE 10 OR LESS*    []  97110 (Therapeutic Exercise)  []  92507 (SLP Treatment)  []  97112 (Neuro Re-ed)   []  92526 (Swallowing Treatment)   a 16109 (Gait Training)   []  [id number] (Cognitive Training, 1st 15 minutes) []  97140 (Manual Therapy)   []  97130 (Cognitive  Training, each add'l 15 minutes)  []  97164 (Re-evaluation)                              []  Other, List CPT Code ____________  []  97530 (Therapeutic Activities)     []  97535 (Self Care)   [x]  All codes above (97110 - 97535)  []  97012 (Mechanical Traction)  []  97014 (E-stim Unattended)  []  97032 (E-stim manual)  []  97033 (Ionto)  []  97035 (Ultrasound) []  97750 (Physical Performance Training) []  [id number] (Aquatic Therapy) []  60454 (Vasopneumatic Device) []  [id number] (Paraffin) []  97034 (Contrast Bath) []  97597 (Wound Care 1st 20 sq cm) []  97598 (Wound Care each add'l 20 sq cm) []  97760 (Orthotic Fabrication, Fitting, Training Initial) []  [id number] (Prosthetic Management and Training Initial) []  [id number] (Orthotic or Prosthetic Training/ Modification Subsequent)

## 2024-01-21 ENCOUNTER — Ambulatory Visit (HOSPITAL_BASED_OUTPATIENT_CLINIC_OR_DEPARTMENT_OTHER): Admitting: Physical Therapy

## 2024-01-21 ENCOUNTER — Encounter (HOSPITAL_BASED_OUTPATIENT_CLINIC_OR_DEPARTMENT_OTHER): Payer: Self-pay | Admitting: Physical Therapy

## 2024-01-21 DIAGNOSIS — R29898 Other symptoms and signs involving the musculoskeletal system: Secondary | ICD-10-CM | POA: Diagnosis not present

## 2024-01-21 DIAGNOSIS — M5459 Other low back pain: Secondary | ICD-10-CM | POA: Diagnosis not present

## 2024-01-21 DIAGNOSIS — R2689 Other abnormalities of gait and mobility: Secondary | ICD-10-CM | POA: Diagnosis not present

## 2024-01-21 DIAGNOSIS — M6281 Muscle weakness (generalized): Secondary | ICD-10-CM | POA: Diagnosis not present

## 2024-01-21 NOTE — Therapy (Signed)
 OUTPATIENT PHYSICAL THERAPY TREATMENT      Patient Name: Joanne Mendoza MRN: [id number] DOB:[birth year], 78 y.o., female Today's Date: 01/21/2024  PHYSICAL THERAPY DISCHARGE SUMMARY  Visits from Start of Care: 39  Current functional level related to goals / functional outcomes: See below   Remaining deficits: See below   Education / Equipment: See below   Patient agrees to discharge. Patient goals were partially met. Patient is being discharged due to meeting the stated rehab goals.    END OF SESSION:  PT End of Session - 01/21/24 1429     Visit Number 39    Number of Visits 52    Date for PT Re-Evaluation 01/15/24    Authorization Type [employer]    Authorization Time Period 10 visits approvedFrom 03.30.2025 - 05.24.2025    Authorization - Visit Number 6    Authorization - Number of Visits 10    PT Start Time 1430    PT Stop Time 1515    PT Time Calculation (min) 45 min    Activity Tolerance Patient tolerated treatment well    Behavior During Therapy WFL for tasks assessed/performed                   Past Medical History:  Diagnosis Date   Hyperlipidemia    Hypertension    Past Surgical History:  Procedure Laterality Date   broken arm     CESAREAN SECTION     x 2    Patient Active Problem List   Diagnosis Date Noted   Hyperlipidemia associated with type 2 diabetes mellitus (HCC) 10/02/2018   White coat hypertension 09/07/2013   Severe obesity (BMI >= 40) (HCC) 05/21/2013   Type 2 diabetes mellitus with hyperglycemia (HCC) 05/20/2013   Essential hypertension 12/12/2008   NONSPECIFIC ABN FINDING RAD & OTH EXAM GU ORGAN 12/12/2008    PCP: Marquetta Sit, MD  REFERRING PROVIDER: Marquetta Sit, MD  REFERRING DIAG: [phone] (ICD-10-CM) - Left-sided low back pain with left-sided sciatica, unspecified chronicity  Rationale for Evaluation and Treatment: Rehabilitation  THERAPY DIAG:  Other abnormalities of gait and  mobility  Muscle weakness (generalized)  Other low back pain  Other symptoms and signs involving the musculoskeletal system  ONSET DATE: chronic  SUBJECTIVE:                                                                                                                                                                                           SUBJECTIVE STATEMENT: Patient states has been doing well. Figure 4 stretch was irritating hip. PT has helped strength and balance. Patient states 50-60% improvement/  functional status. Ready to d/c to HEP.  EVAL: Patient states used to be more mobile but has decreased over last few years. Had a fall earlier this year when walking [place]. Some swelling and numbness in R knee but has been getting better. Some left leg issues as well. Patient states some back aches and pains. Sciatic symptoms L hip/low back to L knee. Is afraid of falling so she limits activity which makes it worse, caught up in cycle.   PERTINENT HISTORY:  HTN, HLD, DM  PAIN:  Are you having pain? None at rest, 5-8/10 with activity.   PRECAUTIONS: None  WEIGHT BEARING RESTRICTIONS: No  FALLS:  Has patient fallen in last 6 months? Yes. Number of falls 1   PLOF: Independent  PATIENT GOALS: get more active again and feel better    OBJECTIVE: (objective measures from initial evaluation unless otherwise dated)  PATIENT SURVEYS:  FOTO 53% function10/16/24: 53% function 12/9:54% 2/4:53% 11/20/23: 53 % function 01/21/24: 44% function  SCREENING FOR RED FLAGS: Bowel or bladder incontinence: No Spinal tumors: No Cauda equina syndrome: No Compression fracture: No Abdominal aneurysm: No  COGNITION: Overall cognitive status: Within functional limits for tasks assessed     SENSATION: WFL   POSTURE: rounded shoulders, forward head, decreased lumbar lordosis, anterior pelvic tilt, and flexed trunk   PALPATION: Mild ttp lumbar paraspinals, L  glutes  LUMBAR ROM:   AROM eval 12/9  01/21/24  Flexion 0% limited 0% limited  0% limited  Extension 75% limited 50% limited 0% No pain  0% limited  Right lateral flexion 25% limited 0% limited  0% limited  Left lateral flexion 25% limited 0% limited  0% limited  Right rotation      Left rotation       (Blank rows = not tested) * = pain/symptoms  LOWER EXTREMITY ROM:     Passive  Right eval Left eval R 2/4 L 2/4 L  01/21/24  Hip flexion       Hip extension       Hip abduction       Hip adduction       Hip internal rotation       Hip external rotation   30active 44passive, 30active 40 AROM  Knee flexion       Knee extension       Ankle dorsiflexion       Ankle plantarflexion       Ankle inversion       Ankle eversion        (Blank rows = not tested) * = pain/symptoms  LOWER EXTREMITY MMT:    MMT Right eval Left eval Right 05/14/23 Left  05/14/23 Right 10/16 Left 06/18/23 Right 12/09 Left 12/09 Right 2/4 Left 2/4 Right 11/20/23 Left 11/20/23 Right 01/21/24 Left 01/21/24  Hip flexion 40.5 39.1   55.4 54.1 39.9 37.1 64.5 59.4 52 54 53.7 47  Hip extension   4/5 4/5 4/5 4/5          Hip abduction 43.0 41.4 4-/5 4/5 72.9/ 4-/5 60.5/ 4-/5 43.7 36.5 43.1 25.7! 57.9 59.2 58.7 45.7 SL 51.4 22.9 SL   Hip adduction                Hip internal rotation                Hip external rotation                Knee flexion  Knee extension 73.3 66.9   82 68.3 26.7 43.3 49.7 43.5 61.8 65 53.6+ 49.7  Ankle dorsiflexion                Ankle plantarflexion                Ankle inversion                Ankle eversion                 (Blank rows = not tested) * = pain/symptoms   FUNCTIONAL TESTS:  5 times sit to stand: 9.53 seconds without UE support, mild unsteadiness no loss of balance   12/9 9 seconds      GAIT: Distance walked: 80 feet Assistive device utilized: None Level of assistance: Complete Independence Comments: forward trunk, decreased foot  clearance, bilateral glute weakness  Gait 11/20/23  TODAY'S TREATMENT:                                                                                                                              DATE:  01/21/24 Reassessment   01/14/24 Discussion of upcoming surgery, rehab, what to expect Standing tricep extension with core activation GTB 2 x 10  Standing hip hike 2 x 10   01/07/24 Standing march on airex 2x20 Review of HEP and condensing of exercises Lateral stepping in mini squat 4 x 12 feet Mini squat retro slide outs 2 x 10   12/31/23 Discussion of POC, hip symptoms Standing march on airex 2x20 Lateral step up and over on airex  2 x 10     12/09/23 Alternating march 1 x 20 Standing march on airex 2x20 Lateral step up and over on airex  Squats at rail 3x10 Mini squat lateral slide outs 2 x 10 Mini squat retro slide outs 2 x 10 Hip hikes off 4" step 1x20ea   12/05/23 Alternating march 1 x 20 Squats at rail 3x10 Standing march on airex 2x20 Lateral step up 6 inch 2 x 10  Hip hikes off 4" step 2x20ea  Standing hip abduction RTB 3x15ea Modified tandem stance 2 x 30 second holds ea  11/25/23 LTR x20ea Bridge 2x15 3 seconds hold Supine SLR 2x10ea Supine active hamstring stretch 10 x 5-10 second holds STS with green TB at thighs 3x10 Squats at rail 3x10 Standing hip abduction RTB 3x15ea Modified tandem stance 2 x 30 second holds ea Standing march on airex 2x20 Hip hikes off 4" step 2x20ea   11/20/23 Reassessment and discussion of POC   11/14/23 LTR x20ea Bridge 2x15 3 seconds hold Supine SLR 1x10ea Supine active hamstring stretch 10 x 5-10 second holds STS with green TB at thighs 3x10 Squats at rail 3x10 Standing hip abduction RTB 3x15ea Modified tandem stance 2 x 20 second holds ea Standing march on airex 2x20   11/11/23 LTR x15ea Bridge 2x15 Supine SLR 2x5 R, 1x5 L Supine active hamstring stretch 10 x  5-10 second holds Squats at rail 3x10 Standing hip  abduction RTB 3x15ea Modified tandem stance 2 x 20 second holds ea  11/07/23 Supine SLR 2x10ea Supine hamstring isometrics 20 x 10 second holds Supine active hamstring stretch 10 x 5-10 second holds Squats at rail 2x15 Lateral step up Standing hip abduction RTB 3x15ea Standing march on airex-single UE on rail 2x10 Modified tandem stance 2 x 20 second holds   11/04/2023: Blank lines following charge title = not provided on this treatment date.   Manual:  TPDN No  There-ex: Supine active hamstring stretch 10 x 5-10 second holds Standing hip abduction RTB 3x15ea There-Act: Bridges 3x10 Squats at rail 3x10 Step ups at 6" step x10 leading with L (single UE support). Nuro-Re-ed: Hip hike off 4" step 2x20bil Supine SLR 2x10ea Standing march on airex-single UE on rail x10 Modified tandem stance    10/31/23 Leg press 10# 2 x 10 Hamstring curl machine 10# 2 x 10 Supine hamstring isometrics 20 x 10 second holds Supine active hamstring stretch 10 x 5-10 second holds Standing hip hike 2 x 10  10/28/2023: Blank lines following charge title = not provided on this treatment date.   Manual:  TPDN No  There-ex: LTR x20 S/l clams GTB 2x15 3sec hold Standing hip abduction GTB 3x10ea There-Act: Bridges 3x10 Squats at rail 3x10 Nuro-Re-ed: Hip hike off 4" step 2x20bil Supine marching with GTB x30 Supine SLR 2x10ea, then 1x5 LAQ 4# 5" hold 3x10   10/24/2023: Blank lines following charge title = not provided on this treatment date.   Manual:  TPDN No  There-ex: LTR x20 S/l clams GTB 2x15 3sec hold Seated HSS 2x30sec each Standing hip abduction GTB 3x10ea There-Act: Bridges 3x10 Squats at rail 3x10 Nuro-Re-ed: Hip hike off 4" step 2x20bil Supine marching with GTB x30 Supine SLR 2x10ea   10/21/23 Bridge 1 x 10  Bridge with clam GTB 2 x 10  PPT with march GTB at feet 2 x 10 SLS on airex with UE support 3 x 30 second holds Hip hike on airex 1 x 8 Palof press with  PPT GTB 2 x 10 Squat 2 x 10   PATIENT EDUCATION:  Education details: Patient educated on exam findings, POC, scope of PT, HEP. 05/14/23: HEP 01/21/24: HEP, reassessment Person educated: Patient Education method: Explanation, Demonstration, and Handouts Education comprehension: verbalized understanding, returned demonstration, verbal cues required, and tactile cues required  HOME EXERCISE PROGRAM: Access Code: [email or web address] URL: [email or web address]   ASSESSMENT:  CLINICAL IMPRESSION: Patient has met 2/4 short term goals and 4/5 long term goals with ability to complete HEP and improvement in symptoms, strength, ROM, activity tolerance, confidence, and functional mobility. Remaining goals not met due to continued deficits in hip symptoms, strength, gait, activity tolerance and functional mobility. Patient with decrease in FOTO score likely due to awareness of deficits in symptoms/mobility. Strength likely fluctuating due to waxing and waning of symptoms as well as possible variations in test position/therapists. Discussed reassessment and HEP. Patient discharged from PT at this time.     OBJECTIVE IMPAIRMENTS: Abnormal gait, decreased activity tolerance, decreased balance, decreased endurance, decreased mobility, difficulty walking, decreased ROM, decreased strength, increased muscle spasms, impaired flexibility, improper body mechanics, postural dysfunction, and pain.   ACTIVITY LIMITATIONS: carrying, lifting, bending, standing, squatting, transfers, hygiene/grooming, locomotion level, and caring for others  PARTICIPATION LIMITATIONS: meal prep, cleaning, laundry, shopping, community activity, and yard work  PERSONAL FACTORS: Fitness, Time since onset of injury/illness/exacerbation, and 3+ comorbidities: HTN, HLD, DM,  hx back pain are also affecting patient's functional outcome.   REHAB POTENTIAL: Good  CLINICAL DECISION MAKING: Stable/uncomplicated  EVALUATION COMPLEXITY:  Low   GOALS: Goals reviewed with patient? Yes  SHORT TERM GOALS: Target date: 05/28/2023    Patient will be independent with HEP in order to improve functional outcomes. Baseline:  Goal status: MET (9/30)  2.  Patient will report at least 25% improvement in symptoms for improved quality of life. Baseline:  Goal status: MET  3. Patient will increase left hip ER to full without pain  Goal status: IN PROGRESS (30deg on 2/4)  4. Patient will increase right quad strength by 10 lbs  Goal status: IN PROGRESS   LONG TERM GOALS: Target date: 06/25/2023    Patient will report at least 75% improvement in symptoms for improved quality of life. Baseline:  Goal status: MET  2.  Patient will improve FOTO score by at least 9 points in order to indicate improved tolerance to activity. Baseline: 53% function 06/18/23: 53% function 53% 12/09 11/20/23: 53 01/24/24 44% function Goal status: INITIAL  3.  Patient will demonstrate at least 25% improvement in lumbar ROM in all restricted planes for improved ability to move trunk while completing chores. Baseline:  Goal status: MET  4.  Patient will report improved confidence with gait, balance, and strength in roder to return to general exercise routine. Baseline:  Goal status: improving 2/4 11/20/23 feels balance is better, less fearful of falling, feels stronger. Not ready to go to gym yet due to fear of injury 01/21/24:  feels improved confidence - MET  5.  Patient will demonstrate 5lb increase in muscle testing in all tested musculature as evidence of improved strength to assist with stair ambulation and gait.   Baseline: see above Goal status: MET     PLAN:  PT FREQUENCY: 1-2x/week  PT DURATION: 8 weeks  PLANNED INTERVENTIONS: Therapeutic exercises, Therapeutic activity, Neuromuscular re-education, Balance training, Gait training, Patient/Family education, Joint manipulation, Joint mobilization, Stair training, Orthotic/Fit training,  DME instructions, Aquatic Therapy, Dry Needling, Electrical stimulation, Spinal manipulation, Spinal mobilization, Cryotherapy, Moist heat, Compression bandaging, scar mobilization, Splintting, Taping, Traction, Ultrasound, Ionotophoresis 4mg /ml Dexamethasone, and Manual therapy  PLAN FOR NEXT SESSION: n/a    Beather Liming, PT 01/21/2024, 3:17 PM    Referring diagnosis? M54.42 (ICD-10-CM) - Left-sided low back pain with left-sided sciatica, unspecified chronicity Treatment diagnosis? (if different than referring diagnosis) M54.59 What was this (referring dx) caused by? []  Surgery [x]  Fall [x]  Ongoing issue []  Arthritis []  Other: ____________  Laterality: []  Rt []  Lt [x]  Both  Check all possible CPT codes:  *CHOOSE 10 OR LESS*    []  97110 (Therapeutic Exercise)  []  29562 (SLP Treatment)  []  97112 (Neuro Re-ed)   []  92526 (Swallowing Treatment)   a 13086 (Gait Training)   []  [id number] (Cognitive Training, 1st 15 minutes) []  97140 (Manual Therapy)   []  97130 (Cognitive Training, each add'l 15 minutes)  []  97164 (Re-evaluation)                              []  Other, List CPT Code ____________  []  97530 (Therapeutic Activities)     []  97535 (Self Care)   [x]  All codes above (97110 - 97535)  []  97012 (Mechanical Traction)  []  97014 (E-stim Unattended)  []  97032 (E-stim manual)  []  97033 (Ionto)  []  97035 (Ultrasound) []  97750 (Physical Performance Training) []  [id number] (Aquatic Therapy) []   04540 (Vasopneumatic Device) []  [id number] (Paraffin) []  97034 (Contrast Bath) []  [phone] (Wound Care 1st 20 sq cm) []  97598 (Wound Care each add'l 20 sq cm) []  97760 (Orthotic Fabrication, Fitting, Training Initial) []  [id number] (Prosthetic Management and Training Initial) []  [id number] (Orthotic or Prosthetic Training/ Modification Subsequent)

## 2024-02-23 ENCOUNTER — Other Ambulatory Visit: Payer: Self-pay | Admitting: Family Medicine

## 2024-03-06 ENCOUNTER — Other Ambulatory Visit: Payer: Self-pay | Admitting: Family Medicine

## 2024-03-10 ENCOUNTER — Encounter: Payer: Self-pay | Admitting: Family Medicine

## 2024-03-10 ENCOUNTER — Ambulatory Visit: Admitting: Family Medicine

## 2024-03-10 ENCOUNTER — Telehealth: Payer: Self-pay

## 2024-03-10 ENCOUNTER — Ambulatory Visit: Payer: Self-pay | Admitting: Family Medicine

## 2024-03-10 VITALS — BP 150/76 | HR 86 | Temp 97.8°F | Wt 197.1 lb

## 2024-03-10 DIAGNOSIS — E785 Hyperlipidemia, unspecified: Secondary | ICD-10-CM | POA: Diagnosis not present

## 2024-03-10 DIAGNOSIS — E1169 Type 2 diabetes mellitus with other specified complication: Secondary | ICD-10-CM | POA: Diagnosis not present

## 2024-03-10 DIAGNOSIS — I10 Essential (primary) hypertension: Secondary | ICD-10-CM

## 2024-03-10 DIAGNOSIS — E1165 Type 2 diabetes mellitus with hyperglycemia: Secondary | ICD-10-CM

## 2024-03-10 DIAGNOSIS — Z01818 Encounter for other preprocedural examination: Secondary | ICD-10-CM

## 2024-03-10 DIAGNOSIS — Z7984 Long term (current) use of oral hypoglycemic drugs: Secondary | ICD-10-CM

## 2024-03-10 LAB — POCT GLYCOSYLATED HEMOGLOBIN (HGB A1C): Hemoglobin A1C: 6 % — AB (ref 4.0–5.6)

## 2024-03-10 NOTE — Telephone Encounter (Signed)
 I spoke with the patient and she has concerns regarding recent EKG. Patient reported she read EKG report and noticed abnormalities regarding QTcH and inquired if she should be concerned about this?

## 2024-03-10 NOTE — Progress Notes (Signed)
 Established Patient Office Visit  Subjective   Patient ID: Joanne Mendoza, female    DOB: February 10, 1946  Age: 78 y.o. MRN: [id number]  Chief Complaint  Patient presents with   Pre-op Exam    HPI   Merrick has history of hypertension, hyperlipidemia, type 2 diabetes.  She is seen today for preoperative clearance for left total hip replacement.  She has severe osteoarthritis involving both hips but pain is actually worse in the left hip.  She stays very busy caring for her almost 16 year old mom along with her brother.  She has had increasing difficulties with ambulation and has increased fear of falling with her hip predicament.  She has recently noticed a little bit of appetite decline which she attributes to the heat.  She has type 2 diabetes which has been well-controlled on metformin .  Hypertension generally controlled with amlodipine  and losartan  HCTZ along with metoprolol .  She takes atorvastatin  40 mg daily for hyperlipidemia.  No cardiac history.  No recent chest pains.  No increased dyspnea with exertion.  She has had some mild bilateral leg edema which she attributes to heat.  She is on amlodipine  which can be contributing.  Her surgery has been scheduled for August 19.  Past Medical History:  Diagnosis Date   Hyperlipidemia    Hypertension    Past Surgical History:  Procedure Laterality Date   broken arm     CESAREAN SECTION     x 2     reports that she has never smoked. She has never used smokeless tobacco. She reports that she does not drink alcohol and does not use drugs. family history includes Alcohol abuse in her father; Colon cancer in her paternal uncle; Hyperlipidemia in an other family member; Hypertension in her mother; Pancreatic cancer in her paternal uncle. Allergies  Allergen Reactions   Penicillins     REACTION: hives    Review of Systems  Constitutional:  Negative for chills, fever and malaise/fatigue.  Eyes:  Negative for blurred vision.   Respiratory:  Negative for shortness of breath.   Cardiovascular:  Negative for chest pain.  Gastrointestinal:  Negative for abdominal pain.  Genitourinary:  Negative for dysuria.  Neurological:  Negative for dizziness, weakness and headaches.      Objective:     BP (!) 150/76 (BP Location: Left Arm, Patient Position: Sitting, Cuff Size: Normal)   Pulse 86   Temp 97.8 F (36.6 C) (Oral)   Wt 197 lb 1.6 oz (89.4 kg)   SpO2 96%   BMI 32.30 kg/m  BP Readings from Last 3 Encounters:  03/10/24 (!) 150/76  10/01/23 130/70  03/31/23 128/72   Wt Readings from Last 3 Encounters:  03/10/24 197 lb 1.6 oz (89.4 kg)  10/01/23 206 lb 14.4 oz (93.8 kg)  03/31/23 205 lb 4.8 oz (93.1 kg)      Physical Exam Vitals reviewed.  Constitutional:      General: She is not in acute distress.    Appearance: She is well-developed. She is not ill-appearing.  Eyes:     Pupils: Pupils are equal, round, and reactive to light.  Neck:     Thyroid: No thyromegaly.     Vascular: No JVD.  Cardiovascular:     Rate and Rhythm: Normal rate and regular rhythm.     Heart sounds:     No gallop.  Pulmonary:     Effort: Pulmonary effort is normal. No respiratory distress.     Breath sounds: Normal breath  sounds. No wheezing or rales.  Musculoskeletal:     Cervical back: Neck supple.     Comments: Mild nonpitting edema feet, ankles, lower legs bilaterally  Neurological:     Mental Status: She is alert.      Results for orders placed or performed in visit on 03/10/24  POC HgB A1c  Result Value Ref Range   Hemoglobin A1C 6.0 (A) 4.0 - 5.6 %   HbA1c POC (<> result, manual entry)     HbA1c, POC (prediabetic range)     HbA1c, POC (controlled diabetic range)      Last CBC Lab Results  Component Value Date   WBC 19.6 (H) 06/05/2015   HGB 14.4 06/05/2015   HCT 42.1 06/05/2015   MCV 91.1 06/05/2015   MCH 31.2 06/05/2015   RDW 13.4 06/05/2015   PLT 282 06/05/2015   Last metabolic panel Lab  Results  Component Value Date   GLUCOSE 117 (H) 10/01/2023   NA 132 (L) 10/01/2023   K 4.2 10/01/2023   CL 97 10/01/2023   CO2 26 10/01/2023   BUN 12 10/01/2023   CREATININE 0.65 10/01/2023   GFR 85.77 10/01/2023   CALCIUM  9.7 10/01/2023   PROT 7.2 10/01/2023   ALBUMIN 4.3 10/01/2023   BILITOT 1.4 (H) 10/01/2023   ALKPHOS 87 10/01/2023   AST 22 10/01/2023   ALT 23 10/01/2023   ANIONGAP 11 06/05/2015   Last lipids Lab Results  Component Value Date   CHOL 133 10/01/2023   HDL 61.00 10/01/2023   LDLCALC 50 10/01/2023   LDLDIRECT 229.1 03/15/2013   TRIG 107.0 10/01/2023   CHOLHDL 2 10/01/2023   Last hemoglobin A1c Lab Results  Component Value Date   HGBA1C 6.0 (A) 03/10/2024        Assessment & Plan:   #1 preoperative evaluation for left total hip replacement.  Patient has chronic stable medical problems as above.  History of whitecoat syndrome.  Blood pressures consistently well-controlled by home readings.  EKG today shows sinus rhythm with no acute changes.  No known contraindications for elective total hip replacement at this time.  A1c today is well-controlled at 6.0%  #2 type 2 diabetes controlled with A1c 6.0%.  Continue metformin .  Patient has made some positive dietary changes and has lost some weight on her own  #3 hypertension.  Probable element of whitecoat syndrome.  Continue close monitoring and continue low-sodium diet  #4 hyperlipidemia treated with atorvastatin  40 mg daily.  Lipids checked in January well-controlled with total cholesterol 133 and LDL of 50   No follow-ups on file.    Wolm Scarlet, MD

## 2024-03-10 NOTE — Telephone Encounter (Signed)
 Copied from CRM [id number]. Topic: Clinical - Lab/Test Results >> Mar 10, 2024  3:56 PM Deaijah H wrote: Reason for CRM: Patient called in to have a nurse give a call back regarding results on a test she had completed. Please call [phone]

## 2024-03-11 ENCOUNTER — Telehealth: Payer: Self-pay

## 2024-03-11 NOTE — Telephone Encounter (Signed)
 Left message for the patient to return my call.

## 2024-03-11 NOTE — Telephone Encounter (Signed)
 Patient informed of the message below and voiced understanding

## 2024-03-11 NOTE — Telephone Encounter (Signed)
 Please see previous encounter

## 2024-03-11 NOTE — Telephone Encounter (Signed)
 Copied from CRM [id number]. Topic: Clinical - Lab/Test Results >> Mar 11, 2024  9:10 AM Harlene ORN wrote: Reason for CRM: patient was instructed to transfer to the office to discuss test results

## 2024-03-29 ENCOUNTER — Telehealth: Payer: Self-pay

## 2024-03-29 NOTE — Telephone Encounter (Signed)
 Copied from CRM [id number]. Topic: General - Other >> Mar 26, 2024  2:42 PM Abigail D wrote: Reason for CRM: Patient has a hip surgery scheduled for next month and she is concerned about falling prior to surgery. She is wondering if she could get a handicap placcard, what steps does she need to take? Please give her a call with any guidance, details.

## 2024-03-30 ENCOUNTER — Ambulatory Visit: Payer: Medicare PPO | Admitting: Family Medicine

## 2024-03-31 ENCOUNTER — Telehealth: Payer: Self-pay

## 2024-03-31 NOTE — Telephone Encounter (Signed)
 Patient informed this has been completed and has been placed in front office for pickup

## 2024-03-31 NOTE — Telephone Encounter (Signed)
 Copied from CRM [id number]. Topic: General - Other >> Mar 31, 2024  2:44 PM Joanne Mendoza wrote: Reason for CRM: patient is calling in to check on handicap placecard request. She was told that it would take 24 hrs to get done. Advised her that she will be contacted once completed.

## 2024-04-07 ENCOUNTER — Other Ambulatory Visit: Payer: Self-pay | Admitting: Family Medicine

## 2024-04-08 NOTE — Patient Instructions (Signed)
 SURGICAL WAITING ROOM VISITATION Patients having surgery or a procedure may have no more than 2 support people in the waiting area - these visitors may rotate in the visitor waiting room.   Due to an increase in RSV and influenza rates and associated hospitalizations, children ages 52 and under may not visit patients in [facility] hospitals. If the patient needs to stay at the hospital during part of their recovery, the visitor guidelines for inpatient rooms apply.  PRE-OP VISITATION  Pre-op nurse will coordinate an appropriate time for 1 support person to accompany the patient in pre-op.  This support person may not rotate.  This visitor will be contacted when the time is appropriate for the visitor to come back in the pre-op area.  Please refer to the [facility] website for the visitor guidelines for Inpatients (after your surgery is over and you are in a regular room).  You are not required to quarantine at this time prior to your surgery. However, you must do this: Hand Hygiene often Do NOT share personal items Notify your provider if you are in close contact with someone who has COVID or you develop fever 100.4 or greater, new onset of sneezing, cough, sore throat, shortness of breath or body aches.  If you test positive for Covid or have been in contact with anyone that has tested positive in the last 10 days please notify you surgeon.    Your procedure is scheduled on:  04/20/24  Report to [facility] Main Entrance: [place] entrance where the [employer] is available.   Report to admitting at: 9:40 AM  Call this number if you have any questions or problems the morning of surgery [phone]  FOLLOW ANY ADDITIONAL PRE OP INSTRUCTIONS YOU RECEIVED FROM YOUR SURGEON'S OFFICE!!!  Do not eat food after Midnight the night prior to your surgery/procedure.  After Midnight you may have the following liquids until : 9:00 AM DAY OF SURGERY  Clear Liquid Diet Water Black  Coffee (sugar ok, NO MILK/CREAM OR CREAMERS)  Tea (sugar ok, NO MILK/CREAM OR CREAMERS) regular and decaf                             Plain Jell-O  with no fruit (NO RED)                                           Fruit ices (not with fruit pulp, NO RED)                                     Popsicles (NO RED)                                                                  Juice: NO CITRUS JUICES: only apple, WHITE grape, WHITE cranberry Sports drinks like Gatorade or Powerade (NO RED)   The day of surgery:  Drink ONE (1) Pre-Surgery Clear G2 at : 9:00 AM the morning of surgery. Drink in one sitting. Do not sip.  This drink was  given to you during your hospital pre-op appointment visit. Nothing else to drink after completing the Pre-Surgery Clear Ensure or G2 : No candy, chewing gum or throat lozenges.    Oral Hygiene is also important to reduce your risk of infection.        Remember - BRUSH YOUR TEETH THE MORNING OF SURGERY WITH YOUR REGULAR TOOTHPASTE  Do NOT smoke after Midnight the night before surgery.  STOP TAKING all Vitamins, Herbs and supplements 1 week before your surgery.   Take ONLY these medicines the morning of surgery with A SIP OF WATER: metoprolol ,amlodipine .Tylenol as needed. DO NOT take metformin  the day of surgery.  If You have been diagnosed with Sleep Apnea - Bring CPAP mask and tubing day of surgery. We will provide you with a CPAP machine on the day of your surgery.                   You may not have any metal on your body including hair pins, jewelry, and body piercing  Do not wear make-up, lotions, powders, perfumes / cologne, or deodorant  Do not wear nail polish including gel and S&S, artificial / acrylic nails, or any other type of covering on natural nails including finger and toenails. If you have artificial nails, gel coating, etc., that needs to be removed by a nail salon, Please have this removed prior to surgery. Not doing so may mean that your surgery  could be cancelled or delayed if the Surgeon or anesthesia staff feels like they are unable to monitor you safely.   Do not shave 48 hours prior to surgery to avoid nicks in your skin which may contribute to postoperative infections.   Contacts, Hearing Aids, dentures or bridgework may not be worn into surgery. DENTURES WILL BE REMOVED PRIOR TO SURGERY PLEASE DO NOT APPLY Poly grip OR ADHESIVES!!!  You may bring a small overnight bag with you on the day of surgery, only pack items that are not valuable. [facility] IS NOT RESPONSIBLE   FOR VALUABLES THAT ARE LOST OR STOLEN.   Patients discharged on the day of surgery will not be allowed to drive home.  Someone NEEDS to stay with you for the first 24 hours after anesthesia.  Do not bring your home medications to the hospital. The Pharmacy will dispense medications listed on your medication list to you during your admission in the Hospital.  Special Instructions: Bring a copy of your healthcare power of attorney and living will documents the day of surgery, if you wish to have them scanned into your [facility] Records- EPIC  Please read over the following fact sheets you were given: IF YOU HAVE QUESTIONS ABOUT YOUR PRE-OP INSTRUCTIONS, PLEASE CALL [phone]   PATIENT SIGNATURE_________________________________  NURSE SIGNATURE__________________________________  ________________________________________________________________________    Pre-operative 5 CHG Bath Instructions   You can play a key role in reducing the risk of infection after surgery. Your skin needs to be as free of germs as possible. You can reduce the number of germs on your skin by washing with CHG (chlorhexidine gluconate) soap before surgery. CHG is an antiseptic soap that kills germs and continues to kill germs even after washing.   DO NOT use if you have an allergy to chlorhexidine/CHG or antibacterial soaps. If your skin becomes reddened or irritated, stop  using the CHG and notify one of our RNs at [phone].   Please shower with the CHG soap starting 4 days before surgery using the following  schedule:     Please keep in mind the following:  DO NOT shave, including legs and underarms, starting the day of your first shower.   You may shave your face at any point before/day of surgery.  Place clean sheets on your bed the day you start using CHG soap. Use a clean washcloth (not used since being washed) for each shower. DO NOT sleep with pets once you start using the CHG.   CHG Shower Instructions:  If you choose to wash your hair and private area, wash first with your normal shampoo/soap.  After you use shampoo/soap, rinse your hair and body thoroughly to remove shampoo/soap residue.  Turn the water OFF and apply about 3 tablespoons (45 ml) of CHG soap to a CLEAN washcloth.  Apply CHG soap ONLY FROM YOUR NECK DOWN TO YOUR TOES (washing for 3-5 minutes)  DO NOT use CHG soap on face, private areas, open wounds, or sores.  Pay special attention to the area where your surgery is being performed.  If you are having back surgery, having someone wash your back for you may be helpful. Wait 2 minutes after CHG soap is applied, then you may rinse off the CHG soap.  Pat dry with a clean towel  Put on clean clothes/pajamas   If you choose to wear lotion, please use ONLY the CHG-compatible lotions on the back of this paper.     Additional instructions for the day of surgery: DO NOT APPLY any lotions, deodorants, cologne, or perfumes.   Put on clean/comfortable clothes.  Brush your teeth.  Ask your nurse before applying any prescription medications to the skin.   CHG Compatible Lotions   Aveeno Moisturizing lotion  Cetaphil Moisturizing Cream  Cetaphil Moisturizing Lotion  Clairol Herbal Essence Moisturizing Lotion, Dry Skin  Clairol Herbal Essence Moisturizing Lotion, Extra Dry Skin  Clairol Herbal Essence Moisturizing Lotion, Normal Skin   Curel Age Defying Therapeutic Moisturizing Lotion with Alpha Hydroxy  Curel Extreme Care Body Lotion  Curel Soothing Hands Moisturizing Hand Lotion  Curel Therapeutic Moisturizing Cream, Fragrance-Free  Curel Therapeutic Moisturizing Lotion, Fragrance-Free  Curel Therapeutic Moisturizing Lotion, Original Formula  Eucerin Daily Replenishing Lotion  Eucerin Dry Skin Therapy Plus Alpha Hydroxy Crme  Eucerin Dry Skin Therapy Plus Alpha Hydroxy Lotion  Eucerin Original Crme  Eucerin Original Lotion  Eucerin Plus Crme Eucerin Plus Lotion  Eucerin TriLipid Replenishing Lotion  Keri Anti-Bacterial Hand Lotion  Keri Deep Conditioning Original Lotion Dry Skin Formula Softly Scented  Keri Deep Conditioning Original Lotion, Fragrance Free Sensitive Skin Formula  Keri Lotion Fast Absorbing Fragrance Free Sensitive Skin Formula  Keri Lotion Fast Absorbing Softly Scented Dry Skin Formula  Keri Original Lotion  Keri Skin Renewal Lotion Keri Silky Smooth Lotion  Keri Silky Smooth Sensitive Skin Lotion  Nivea Body Creamy Conditioning Oil  Nivea Body Extra Enriched Lotion  Nivea Body Original Lotion  Nivea Body Sheer Moisturizing Lotion Nivea Crme  Nivea Skin Firming Lotion  NutraDerm 30 Skin Lotion  NutraDerm Skin Lotion  NutraDerm Therapeutic Skin Cream  NutraDerm Therapeutic Skin Lotion  ProShield Protective Hand Cream  Provon moisturizing lotion   Incentive Spirometer  An incentive spirometer is a tool that can help keep your lungs clear and active. This tool measures how well you are filling your lungs with each breath. Taking long deep breaths may help reverse or decrease the chance of developing breathing (pulmonary) problems (especially infection) following: A long period of time when you are unable to move or  be active. BEFORE THE PROCEDURE  If the spirometer includes an indicator to show your best effort, your nurse or respiratory therapist will set it to a desired goal. If  possible, sit up straight or lean slightly forward. Try not to slouch. Hold the incentive spirometer in an upright position. INSTRUCTIONS FOR USE  Sit on the edge of your bed if possible, or sit up as far as you can in bed or on a chair. Hold the incentive spirometer in an upright position. Breathe out normally. Place the mouthpiece in your mouth and seal your lips tightly around it. Breathe in slowly and as deeply as possible, raising the piston or the ball toward the top of the column. Hold your breath for 3-5 seconds or for as long as possible. Allow the piston or ball to fall to the bottom of the column. Remove the mouthpiece from your mouth and breathe out normally. Rest for a few seconds and repeat Steps 1 through 7 at least 10 times every 1-2 hours when you are awake. Take your time and take a few normal breaths between deep breaths. The spirometer may include an indicator to show your best effort. Use the indicator as a goal to work toward during each repetition. After each set of 10 deep breaths, practice coughing to be sure your lungs are clear. If you have an incision (the cut made at the time of surgery), support your incision when coughing by placing a pillow or rolled up towels firmly against it. Once you are able to get out of bed, walk around indoors and cough well. You may stop using the incentive spirometer when instructed by your caregiver.  RISKS AND COMPLICATIONS Take your time so you do not get dizzy or light-headed. If you are in pain, you may need to take or ask for pain medication before doing incentive spirometry. It is harder to take a deep breath if you are having pain. AFTER USE Rest and breathe slowly and easily. It can be helpful to keep track of a log of your progress. Your caregiver can provide you with a simple table to help with this. If you are using the spirometer at home, follow these instructions: SEEK MEDICAL CARE IF:  You are having difficultly using the  spirometer. You have trouble using the spirometer as often as instructed. Your pain medication is not giving enough relief while using the spirometer. You develop fever of 100.5 F (38.1 C) or higher. SEEK IMMEDIATE MEDICAL CARE IF:  You cough up bloody sputum that had not been present before. You develop fever of 102 F (38.9 C) or greater. You develop worsening pain at or near the incision site. MAKE SURE YOU:  Understand these instructions. Will watch your condition. Will get help right away if you are not doing well or get worse. Document Released: 12/30/2006 Document Revised: 11/11/2011 Document Reviewed: 03/02/2007 [place] Patient Information 2014 [place], [place].   ________________________________________________________________________

## 2024-04-09 ENCOUNTER — Other Ambulatory Visit: Payer: Self-pay

## 2024-04-09 ENCOUNTER — Encounter (HOSPITAL_COMMUNITY): Payer: Self-pay

## 2024-04-09 ENCOUNTER — Encounter (HOSPITAL_COMMUNITY)
Admission: RE | Admit: 2024-04-09 | Discharge: 2024-04-09 | Disposition: A | Discharge status: Home / Self Care | Source: Ambulatory Visit | Attending: Orthopedic Surgery | Admitting: Orthopedic Surgery

## 2024-04-09 VITALS — BP 146/77 | HR 70 | Temp 97.8°F | Ht 63.5 in | Wt 196.0 lb

## 2024-04-09 DIAGNOSIS — E1165 Type 2 diabetes mellitus with hyperglycemia: Secondary | ICD-10-CM | POA: Diagnosis not present

## 2024-04-09 DIAGNOSIS — Z01812 Encounter for preprocedural laboratory examination: Secondary | ICD-10-CM | POA: Diagnosis not present

## 2024-04-09 DIAGNOSIS — M1612 Unilateral primary osteoarthritis, left hip: Secondary | ICD-10-CM

## 2024-04-09 DIAGNOSIS — I10 Essential (primary) hypertension: Secondary | ICD-10-CM | POA: Diagnosis not present

## 2024-04-09 DIAGNOSIS — Z01818 Encounter for other preprocedural examination: Secondary | ICD-10-CM

## 2024-04-09 HISTORY — DX: Unspecified osteoarthritis, unspecified site: M19.90

## 2024-04-09 HISTORY — DX: Type 2 diabetes mellitus without complications: E11.9

## 2024-04-09 LAB — CBC
HCT: 40.3 % (ref 36.0–46.0)
Hemoglobin: 13.4 g/dL (ref 12.0–15.0)
MCH: 32.2 pg (ref 26.0–34.0)
MCHC: 33.3 g/dL (ref 30.0–36.0)
MCV: 96.9 fL (ref 80.0–100.0)
Platelets: 285 K/uL (ref 150–400)
RBC: 4.16 MIL/uL (ref 3.87–5.11)
RDW: 13.2 % (ref 11.5–15.5)
WBC: 8.2 K/uL (ref 4.0–10.5)
nRBC: 0 % (ref 0.0–0.2)

## 2024-04-09 LAB — TYPE AND SCREEN
ABO/RH(D): B POS
Antibody Screen: NEGATIVE

## 2024-04-09 LAB — BASIC METABOLIC PANEL WITH GFR
Anion gap: 10 (ref 5–15)
BUN: 9 mg/dL (ref 8–23)
CO2: 24 mmol/L (ref 22–32)
Calcium: 9.7 mg/dL (ref 8.9–10.3)
Chloride: 101 mmol/L (ref 98–111)
Creatinine, Ser: 0.5 mg/dL (ref 0.44–1.00)
GFR, Estimated: >60 mL/min (ref >60)
Glucose, Bld: 123 mg/dL — ABNORMAL HIGH (ref 70–99)
Potassium: 4.2 mmol/L (ref 3.5–5.1)
Sodium: 135 mmol/L (ref 135–145)

## 2024-04-09 LAB — GLUCOSE, CAPILLARY: Glucose-Capillary: 121 mg/dL — ABNORMAL HIGH (ref 70–99)

## 2024-04-09 LAB — SURGICAL PCR SCREEN
MRSA, PCR: NEGATIVE
Staphylococcus aureus: POSITIVE — AB

## 2024-04-09 NOTE — Progress Notes (Signed)
 For Anesthesia: PCP - Micheal Wolm ORN, MD . ARNETTA: 03/10/26: Clearance. Cardiologist - N/A  Bowel Prep reminder:  Chest x-ray -  EKG - 03/10/24 Stress Test -  ECHO -  Cardiac Cath -  Pacemaker/ICD device last checked: Pacemaker orders received: Device Rep notified:  Spinal Cord Stimulator:N/A  Sleep Study - N/A CPAP -   Fasting Blood Sugar - N/A Checks Blood Sugar __0___ times a day Date and result of last Hgb A1c-6.0: 03/10/24  Last dose of GLP1 agonist- N/A GLP1 instructions:   Last dose of SGLT-2 inhibitors- N/A SGLT-2 instructions:   Blood Thinner Instructions: Aspirin Instructions: To hold it for 7 days Last Dose:  Activity level: Can go up a flight of stairs and activities of daily living without stopping and without chest pain and/or shortness of breath   Able to exercise without chest pain and/or shortness of breath  Anesthesia review: Hx: HTN,DIA  Patient denies shortness of breath, fever, cough and chest pain at PAT appointment   Patient verbalized understanding of instructions that were reviewed over the telephone.

## 2024-04-12 NOTE — Progress Notes (Signed)
 PCR: + STAPH.

## 2024-04-13 NOTE — H&P (Signed)
 TOTAL HIP ADMISSION H&P  Patient is admitted for left total hip arthroplasty.  Therapy Plans: HEP Disposition: Home with sons (staying for 10 days) Planned DVT Prophylaxis: aspirin  81mg  BID DME needed: walker PCP: Dr. Micheal - clearance received TXA: IV Allergies: PCN - childhood Anesthesia Concerns: none BMI: 33.8 Last HgbA1c: 6.0%  Other: - staying overnight - has right hip OA -- will plan on being replaced soon - no hx of VTE or cancer  Subjective:  Chief Complaint: left hip pain  HPI: MORNING HALBERG, 78 y.o. female, has a history of pain and functional disability in the left hip(s) due to arthritis and patient has failed non-surgical conservative treatments for greater than 12 weeks to include NSAID's and/or analgesics and activity modification.  Onset of symptoms was gradual starting 2 years ago with gradually worsening course since that time.The patient noted no past surgery on the left hip(s).  Patient currently rates pain in the left hip at 8 out of 10 with activity. Patient has worsening of pain with activity and weight bearing, pain that interfers with activities of daily living, and pain with passive range of motion. Patient has evidence of joint space narrowing by imaging studies. This condition presents safety issues increasing the risk of falls.   There is no current active infection.  Patient Active Problem List   Diagnosis Date Noted   Hyperlipidemia associated with type 2 diabetes mellitus (HCC) 10/02/2018   White coat hypertension 09/07/2013   Severe obesity (BMI >= 40) (HCC) 05/21/2013   Type 2 diabetes mellitus with hyperglycemia (HCC) 05/20/2013   Essential hypertension 12/12/2008   NONSPECIFIC ABN FINDING RAD & OTH EXAM GU ORGAN 12/12/2008   Past Medical History:  Diagnosis Date   Arthritis    Diabetes mellitus without complication (HCC)    Hyperlipidemia    Hypertension     Past Surgical History:  Procedure Laterality Date   broken arm      CESAREAN SECTION     x 2     No current facility-administered medications for this encounter.   Current Outpatient Medications  Medication Sig Dispense Refill Last Dose/Taking   acetaminophen  (TYLENOL ) 325 MG tablet Take 650 mg by mouth every 6 (six) hours as needed for moderate pain (pain score 4-6).   Taking As Needed   amLODipine  (NORVASC ) 5 MG tablet TAKE 1 TABLET(5 MG) BY MOUTH DAILY 90 tablet 1 Taking   aspirin  EC 81 MG tablet Take 81 mg by mouth every 6 (six) hours as needed for moderate pain (pain score 4-6). Swallow whole.   Taking As Needed   atorvastatin  (LIPITOR) 40 MG tablet TAKE 1 TABLET(40 MG) BY MOUTH DAILY 90 tablet 1 Taking   losartan -hydrochlorothiazide  (HYZAAR) 100-12.5 MG tablet Take 1 tablet by mouth daily. 90 tablet 3 Taking   metFORMIN  (GLUCOPHAGE ) 500 MG tablet TAKE 2 TABLETS BY MOUTH TWICE DAILY 360 tablet 0 Taking   metoprolol  succinate (TOPROL -XL) 50 MG 24 hr tablet TAKE 1 TABLET(50 MG) BY MOUTH EVERY DAY WITH OR IMMEDIATELY FOLLOWING A MEAL 90 tablet 1 Taking   PREVIDENT 5000 PLUS 1.1 % CREA dental cream Place 1 Application onto teeth daily.   Taking   Allergies  Allergen Reactions   Darvon-N [Propoxyphene]     Made her pain worse   Penicillins Hives    Social History   Tobacco Use   Smoking status: Never   Smokeless tobacco: Never  Substance Use Topics   Alcohol use: No    Comment: 4 times a  year wine    Family History  Problem Relation Age of Onset   Alcohol abuse Father    Hyperlipidemia Other        Grandparent    Hypertension Mother    Colon cancer Paternal Uncle    Pancreatic cancer Paternal Uncle      Review of Systems  Constitutional:  Negative for chills and fever.  Respiratory:  Negative for cough and shortness of breath.   Cardiovascular:  Negative for chest pain.  Gastrointestinal:  Negative for nausea and vomiting.  Musculoskeletal:  Positive for arthralgias.     Objective:  Physical Exam Well nourished and well  developed. General: Alert and oriented x3, cooperative and pleasant, no acute distress.  Musculoskeletal: Bilateral hip exams: She does have reproducible pain and tightness with hip flexion internal rotation to 5 degrees, external rotation over 20 degrees Mild external rotation contracture with active hip flexion bilaterally with maintenance of full strength Neurovascular intact distally without lower extremity edema, erythema or calf tenderness  Vital signs in last 24 hours:    Labs:   Estimated body mass index is 34.18 kg/m as calculated from the following:   Height as of 04/09/24: 5' 3.5 (1.613 m).   Weight as of 04/09/24: 88.9 kg.   Imaging Review Plain radiographs demonstrate severe degenerative joint disease of the left hip(s). The bone quality appears to be adequate for age and reported activity level.      Assessment/Plan:  End stage arthritis, left hip(s)  The patient history, physical examination, clinical judgement of the provider and imaging studies are consistent with end stage degenerative joint disease of the left hip(s) and total hip arthroplasty is deemed medically necessary. The treatment options including medical management, injection therapy, arthroscopy and arthroplasty were discussed at length. The risks and benefits of total hip arthroplasty were presented and reviewed. The risks due to aseptic loosening, infection, stiffness, dislocation/subluxation,  thromboembolic complications and other imponderables were discussed.  The patient acknowledged the explanation, agreed to proceed with the plan and consent was signed. Patient is being admitted for inpatient treatment for surgery, pain control, PT, OT, prophylactic antibiotics, VTE prophylaxis, progressive ambulation and ADL's and discharge planning.The patient is planning to be discharged home.   Rosina Calin, PA-C Orthopedic Surgery [facility] [place] [phone]

## 2024-04-20 ENCOUNTER — Encounter (HOSPITAL_COMMUNITY): Payer: Self-pay | Admitting: Orthopedic Surgery

## 2024-04-20 ENCOUNTER — Ambulatory Visit (HOSPITAL_COMMUNITY): Admitting: Anesthesiology

## 2024-04-20 ENCOUNTER — Ambulatory Visit (HOSPITAL_COMMUNITY)

## 2024-04-20 ENCOUNTER — Other Ambulatory Visit: Payer: Self-pay

## 2024-04-20 ENCOUNTER — Ambulatory Visit (HOSPITAL_BASED_OUTPATIENT_CLINIC_OR_DEPARTMENT_OTHER): Admitting: Anesthesiology

## 2024-04-20 ENCOUNTER — Observation Stay (HOSPITAL_COMMUNITY)
Admission: RE | Admit: 2024-04-20 | Discharge: 2024-04-21 | Disposition: A | Discharge status: Home / Self Care | Attending: Orthopedic Surgery | Admitting: Orthopedic Surgery

## 2024-04-20 ENCOUNTER — Encounter (HOSPITAL_COMMUNITY)
Admission: RE | Disposition: A | Discharge status: Home / Self Care | Payer: Self-pay | Source: Home / Self Care | Attending: Orthopedic Surgery

## 2024-04-20 ENCOUNTER — Observation Stay (HOSPITAL_COMMUNITY)

## 2024-04-20 DIAGNOSIS — Z7984 Long term (current) use of oral hypoglycemic drugs: Secondary | ICD-10-CM | POA: Diagnosis not present

## 2024-04-20 DIAGNOSIS — I10 Essential (primary) hypertension: Secondary | ICD-10-CM | POA: Diagnosis not present

## 2024-04-20 DIAGNOSIS — M1612 Unilateral primary osteoarthritis, left hip: Principal | ICD-10-CM | POA: Insufficient documentation

## 2024-04-20 DIAGNOSIS — Z96642 Presence of left artificial hip joint: Secondary | ICD-10-CM | POA: Diagnosis not present

## 2024-04-20 DIAGNOSIS — Z79899 Other long term (current) drug therapy: Secondary | ICD-10-CM | POA: Insufficient documentation

## 2024-04-20 DIAGNOSIS — E119 Type 2 diabetes mellitus without complications: Secondary | ICD-10-CM | POA: Insufficient documentation

## 2024-04-20 DIAGNOSIS — M1611 Unilateral primary osteoarthritis, right hip: Secondary | ICD-10-CM | POA: Diagnosis not present

## 2024-04-20 DIAGNOSIS — M25552 Pain in left hip: Secondary | ICD-10-CM | POA: Diagnosis present

## 2024-04-20 DIAGNOSIS — Z880 Allergy status to penicillin: Secondary | ICD-10-CM | POA: Diagnosis not present

## 2024-04-20 HISTORY — PX: TOTAL HIP ARTHROPLASTY: SHX124

## 2024-04-20 LAB — GLUCOSE, CAPILLARY
Glucose-Capillary: 147 mg/dL — ABNORMAL HIGH (ref 70–99)
Glucose-Capillary: 159 mg/dL — ABNORMAL HIGH (ref 70–99)
Glucose-Capillary: 200 mg/dL — ABNORMAL HIGH (ref 70–99)
Glucose-Capillary: 176 mg/dL — ABNORMAL HIGH (ref 70–99)

## 2024-04-20 LAB — ABO/RH: ABO/RH(D): B POS

## 2024-04-20 SURGERY — ARTHROPLASTY, HIP, TOTAL, ANTERIOR APPROACH
Anesthesia: Monitor Anesthesia Care | Site: Hip | Laterality: Left

## 2024-04-20 MED ORDER — HYDROCHLOROTHIAZIDE 12.5 MG PO TABS
12.5000 mg | ORAL_TABLET | Freq: Every day | ORAL | Status: DC
  Administered 2024-04-21: 12.5 mg via ORAL
  Filled 2024-04-20: qty 1

## 2024-04-20 MED ORDER — POVIDONE-IODINE 10 % EX SWAB: 2.0000 | Freq: Once | CUTANEOUS | Status: DC

## 2024-04-20 MED ORDER — ONDANSETRON HCL 4 MG/2ML IJ SOLN
4.0000 mg | Freq: Four times a day (QID) | INTRAMUSCULAR | Status: DC | PRN
  Administered 2024-04-20: 4 mg via INTRAVENOUS
  Filled 2024-04-20: qty 2

## 2024-04-20 MED ORDER — INSULIN ASPART 100 UNIT/ML IJ SOLN: 0.0000 [IU] | INTRAMUSCULAR | Status: DC | PRN

## 2024-04-20 MED ORDER — HYDROMORPHONE HCL 2 MG/ML IJ SOLN
INTRAMUSCULAR | Status: AC
  Filled 2024-04-20: qty 1

## 2024-04-20 MED ORDER — BUPIVACAINE-EPINEPHRINE (PF) 0.25% -1:200000 IJ SOLN
INTRAMUSCULAR | Status: AC
  Filled 2024-04-20: qty 30

## 2024-04-20 MED ORDER — CEFAZOLIN SODIUM-DEXTROSE 2-4 GM/100ML-% IV SOLN
2.0000 g | INTRAVENOUS | Status: AC
  Administered 2024-04-20: 2 g via INTRAVENOUS
  Filled 2024-04-20: qty 100

## 2024-04-20 MED ORDER — DEXAMETHASONE SODIUM PHOSPHATE 10 MG/ML IJ SOLN
INTRAMUSCULAR | Status: DC | PRN
Start: 2024-04-20 — End: 2024-04-20
  Administered 2024-04-20: 10 mg via INTRAVENOUS

## 2024-04-20 MED ORDER — PROPOFOL 500 MG/50ML IV EMUL
INTRAVENOUS | Status: DC | PRN
  Administered 2024-04-20: 120 ug/kg/min via INTRAVENOUS

## 2024-04-20 MED ORDER — ASPIRIN 81 MG PO CHEW
81.0000 mg | CHEWABLE_TABLET | Freq: Two times a day (BID) | ORAL | Status: DC
  Administered 2024-04-20 – 2024-04-21 (×2): 81 mg via ORAL
  Filled 2024-04-20 (×2): qty 1

## 2024-04-20 MED ORDER — ONDANSETRON HCL 4 MG/2ML IJ SOLN
INTRAMUSCULAR | Status: AC
  Filled 2024-04-20: qty 2

## 2024-04-20 MED ORDER — HYDROMORPHONE HCL 1 MG/ML IJ SOLN
0.2500 mg | INTRAMUSCULAR | Status: DC | PRN
  Administered 2024-04-20: 0.5 mg via INTRAVENOUS

## 2024-04-20 MED ORDER — HYDROMORPHONE HCL 1 MG/ML IJ SOLN: 0.5000 mg | INTRAMUSCULAR | Status: DC | PRN

## 2024-04-20 MED ORDER — OXYCODONE HCL 5 MG/5ML PO SOLN: 5.0000 mg | Freq: Once | ORAL | Status: DC | PRN

## 2024-04-20 MED ORDER — METOCLOPRAMIDE HCL 5 MG PO TABS: 5.0000 mg | ORAL_TABLET | Freq: Three times a day (TID) | ORAL | Status: DC | PRN

## 2024-04-20 MED ORDER — ONDANSETRON HCL 4 MG/2ML IJ SOLN
INTRAMUSCULAR | Status: DC | PRN
  Administered 2024-04-20: 4 mg via INTRAVENOUS

## 2024-04-20 MED ORDER — CHLORHEXIDINE GLUCONATE 0.12 % MT SOLN
15.0000 mL | Freq: Once | OROMUCOSAL | Status: AC
  Administered 2024-04-20: 15 mL via OROMUCOSAL

## 2024-04-20 MED ORDER — MUPIROCIN 2 % EX OINT: 1.0000 | TOPICAL_OINTMENT | Freq: Two times a day (BID) | CUTANEOUS | 0 refills | Status: DC

## 2024-04-20 MED ORDER — ALUM & MAG HYDROXIDE-SIMETH 200-200-20 MG/5ML PO SUSP: 30.0000 mL | ORAL | Status: DC | PRN

## 2024-04-20 MED ORDER — ACETAMINOPHEN 500 MG PO TABS
1000.0000 mg | ORAL_TABLET | Freq: Four times a day (QID) | ORAL | Status: DC
  Administered 2024-04-20 – 2024-04-21 (×4): 1000 mg via ORAL
  Filled 2024-04-20 (×4): qty 2

## 2024-04-20 MED ORDER — METHOCARBAMOL 500 MG PO TABS
500.0000 mg | ORAL_TABLET | Freq: Four times a day (QID) | ORAL | Status: DC | PRN
  Administered 2024-04-20 – 2024-04-21 (×2): 500 mg via ORAL
  Filled 2024-04-20 (×2): qty 1

## 2024-04-20 MED ORDER — INSULIN ASPART 100 UNIT/ML IJ SOLN
0.0000 [IU] | Freq: Three times a day (TID) | INTRAMUSCULAR | Status: DC
  Administered 2024-04-20: 3 [IU] via SUBCUTANEOUS
  Administered 2024-04-21: 2 [IU] via SUBCUTANEOUS

## 2024-04-20 MED ORDER — BUPIVACAINE-EPINEPHRINE (PF) 0.25% -1:200000 IJ SOLN
INTRAMUSCULAR | Status: DC | PRN
  Administered 2024-04-20: 30 mL

## 2024-04-20 MED ORDER — TRANEXAMIC ACID-NACL 1000-0.7 MG/100ML-% IV SOLN
1000.0000 mg | Freq: Once | INTRAVENOUS | Status: AC
  Administered 2024-04-20: 1000 mg via INTRAVENOUS
  Filled 2024-04-20: qty 100

## 2024-04-20 MED ORDER — DEXAMETHASONE SODIUM PHOSPHATE 10 MG/ML IJ SOLN
10.0000 mg | Freq: Once | INTRAMUSCULAR | Status: AC
  Administered 2024-04-21: 10 mg via INTRAVENOUS
  Filled 2024-04-20: qty 1

## 2024-04-20 MED ORDER — PROPOFOL 1000 MG/100ML IV EMUL
INTRAVENOUS | Status: AC
  Filled 2024-04-20: qty 200

## 2024-04-20 MED ORDER — SODIUM CHLORIDE (PF) 0.9 % IJ SOLN
INTRAMUSCULAR | Status: AC
  Filled 2024-04-20: qty 30

## 2024-04-20 MED ORDER — DEXAMETHASONE SODIUM PHOSPHATE 10 MG/ML IJ SOLN
INTRAMUSCULAR | Status: AC
  Filled 2024-04-20: qty 1

## 2024-04-20 MED ORDER — CEFAZOLIN SODIUM-DEXTROSE 2-4 GM/100ML-% IV SOLN
2.0000 g | Freq: Four times a day (QID) | INTRAVENOUS | Status: AC
  Administered 2024-04-20 – 2024-04-21 (×2): 2 g via INTRAVENOUS
  Filled 2024-04-20 (×2): qty 100

## 2024-04-20 MED ORDER — STERILE WATER FOR IRRIGATION IR SOLN
Status: DC | PRN
  Administered 2024-04-20: 2000 mL

## 2024-04-20 MED ORDER — DEXAMETHASONE SODIUM PHOSPHATE 10 MG/ML IJ SOLN: 8.0000 mg | Freq: Once | INTRAMUSCULAR | Status: DC

## 2024-04-20 MED ORDER — MENTHOL 3 MG MT LOZG: 1.0000 | LOZENGE | OROMUCOSAL | Status: DC | PRN

## 2024-04-20 MED ORDER — 0.9 % SODIUM CHLORIDE (POUR BTL) OPTIME
TOPICAL | Status: DC | PRN
  Administered 2024-04-20: 1000 mL

## 2024-04-20 MED ORDER — HYDROMORPHONE HCL 1 MG/ML IJ SOLN
INTRAMUSCULAR | Status: AC
  Filled 2024-04-20: qty 1

## 2024-04-20 MED ORDER — LACTATED RINGERS IV SOLN: INTRAVENOUS | Status: DC | PRN

## 2024-04-20 MED ORDER — METHOCARBAMOL 1000 MG/10ML IJ SOLN: 500.0000 mg | Freq: Four times a day (QID) | INTRAMUSCULAR | Status: DC | PRN

## 2024-04-20 MED ORDER — OXYCODONE HCL 5 MG PO TABS: 2.5000 mg | ORAL_TABLET | ORAL | Status: DC | PRN

## 2024-04-20 MED ORDER — SODIUM CHLORIDE 0.9 % IV SOLN: INTRAVENOUS | Status: DC

## 2024-04-20 MED ORDER — BISACODYL 10 MG RE SUPP
10.0000 mg | Freq: Every day | RECTAL | Status: DC | PRN
Start: 2024-04-20 — End: 2024-04-21

## 2024-04-20 MED ORDER — KETOROLAC TROMETHAMINE 30 MG/ML IJ SOLN
INTRAMUSCULAR | Status: DC | PRN
  Administered 2024-04-20: 30 mg

## 2024-04-20 MED ORDER — ATORVASTATIN CALCIUM 40 MG PO TABS
40.0000 mg | ORAL_TABLET | Freq: Every evening | ORAL | Status: DC
  Administered 2024-04-20: 40 mg via ORAL
  Filled 2024-04-20: qty 1

## 2024-04-20 MED ORDER — METOCLOPRAMIDE HCL 5 MG/ML IJ SOLN: 5.0000 mg | Freq: Three times a day (TID) | INTRAMUSCULAR | Status: DC | PRN

## 2024-04-20 MED ORDER — DIPHENHYDRAMINE HCL 12.5 MG/5ML PO ELIX
12.5000 mg | ORAL_SOLUTION | ORAL | Status: DC | PRN
  Filled 2024-04-20: qty 5

## 2024-04-20 MED ORDER — ORAL CARE MOUTH RINSE: 15.0000 mL | Freq: Once | OROMUCOSAL | Status: AC

## 2024-04-20 MED ORDER — METFORMIN HCL 500 MG PO TABS
1000.0000 mg | ORAL_TABLET | Freq: Two times a day (BID) | ORAL | Status: DC
  Administered 2024-04-21: 1000 mg via ORAL
  Filled 2024-04-20: qty 2

## 2024-04-20 MED ORDER — OXYCODONE HCL 5 MG PO TABS: 10.0000 mg | ORAL_TABLET | ORAL | Status: DC | PRN

## 2024-04-20 MED ORDER — OXYCODONE HCL 5 MG PO TABS: 5.0000 mg | ORAL_TABLET | Freq: Once | ORAL | Status: DC | PRN

## 2024-04-20 MED ORDER — PROPOFOL 10 MG/ML IV BOLUS
INTRAVENOUS | Status: DC | PRN
Start: 2024-04-20 — End: 2024-04-20
  Administered 2024-04-20: 20 mg via INTRAVENOUS
  Administered 2024-04-20: 40 mg via INTRAVENOUS
  Administered 2024-04-20: 30 mg via INTRAVENOUS
  Administered 2024-04-20: 20 mg via INTRAVENOUS

## 2024-04-20 MED ORDER — FENTANYL CITRATE (PF) 100 MCG/2ML IJ SOLN
INTRAMUSCULAR | Status: DC | PRN
  Administered 2024-04-20 (×2): 50 ug via INTRAVENOUS

## 2024-04-20 MED ORDER — FENTANYL CITRATE PF 50 MCG/ML IJ SOSY: 25.0000 ug | PREFILLED_SYRINGE | INTRAMUSCULAR | Status: DC | PRN

## 2024-04-20 MED ORDER — POLYETHYLENE GLYCOL 3350 17 G PO PACK
17.0000 g | PACK | Freq: Two times a day (BID) | ORAL | Status: DC
  Administered 2024-04-20 – 2024-04-21 (×2): 17 g via ORAL
  Filled 2024-04-20 (×3): qty 1

## 2024-04-20 MED ORDER — CHLORHEXIDINE GLUCONATE 4 % EX SOLN: 1.0000 | CUTANEOUS | 1 refills | Status: DC

## 2024-04-20 MED ORDER — PHENYLEPHRINE HCL-NACL 20-0.9 MG/250ML-% IV SOLN
INTRAVENOUS | Status: DC | PRN
  Administered 2024-04-20: 40 ug/min via INTRAVENOUS

## 2024-04-20 MED ORDER — ONDANSETRON HCL 4 MG PO TABS: 4.0000 mg | ORAL_TABLET | Freq: Four times a day (QID) | ORAL | Status: DC | PRN

## 2024-04-20 MED ORDER — GLYCOPYRROLATE 0.2 MG/ML IJ SOLN
INTRAMUSCULAR | Status: DC | PRN
  Administered 2024-04-20: .2 mg via INTRAVENOUS

## 2024-04-20 MED ORDER — LOSARTAN POTASSIUM 50 MG PO TABS
100.0000 mg | ORAL_TABLET | Freq: Every day | ORAL | Status: DC
  Administered 2024-04-21: 100 mg via ORAL
  Filled 2024-04-20: qty 2

## 2024-04-20 MED ORDER — TRANEXAMIC ACID-NACL 1000-0.7 MG/100ML-% IV SOLN
1000.0000 mg | INTRAVENOUS | Status: AC
  Administered 2024-04-20: 1000 mg via INTRAVENOUS
  Filled 2024-04-20: qty 100

## 2024-04-20 MED ORDER — PHENOL 1.4 % MT LIQD: 1.0000 | OROMUCOSAL | Status: DC | PRN

## 2024-04-20 MED ORDER — KETOROLAC TROMETHAMINE 30 MG/ML IJ SOLN
INTRAMUSCULAR | Status: AC
  Filled 2024-04-20: qty 1

## 2024-04-20 MED ORDER — AMLODIPINE BESYLATE 5 MG PO TABS
5.0000 mg | ORAL_TABLET | Freq: Every day | ORAL | Status: DC
  Administered 2024-04-21: 5 mg via ORAL
  Filled 2024-04-20: qty 1

## 2024-04-20 MED ORDER — BUPIVACAINE IN DEXTROSE 0.75-8.25 % IT SOLN
INTRATHECAL | Status: DC | PRN
Start: 2024-04-20 — End: 2024-04-20
  Administered 2024-04-20: 1.6 mL via INTRATHECAL

## 2024-04-20 MED ORDER — ONDANSETRON HCL 4 MG/2ML IJ SOLN: 4.0000 mg | Freq: Four times a day (QID) | INTRAMUSCULAR | Status: DC | PRN

## 2024-04-20 MED ORDER — KETOROLAC TROMETHAMINE 15 MG/ML IJ SOLN
7.5000 mg | Freq: Four times a day (QID) | INTRAMUSCULAR | Status: AC
  Administered 2024-04-20 – 2024-04-21 (×2): 7.5 mg via INTRAVENOUS
  Filled 2024-04-20 (×2): qty 1

## 2024-04-20 MED ORDER — LOSARTAN POTASSIUM-HCTZ 100-12.5 MG PO TABS: 1.0000 | ORAL_TABLET | Freq: Every day | ORAL | Status: DC

## 2024-04-20 MED ORDER — LACTATED RINGERS IV SOLN: INTRAVENOUS | Status: DC

## 2024-04-20 MED ORDER — HYDROMORPHONE HCL 1 MG/ML IJ SOLN
INTRAMUSCULAR | Status: DC | PRN
  Administered 2024-04-20 (×3): .2 mg via INTRAVENOUS

## 2024-04-20 MED ORDER — SODIUM CHLORIDE (PF) 0.9 % IJ SOLN
INTRAMUSCULAR | Status: DC | PRN
  Administered 2024-04-20: 30 mL

## 2024-04-20 MED ORDER — SENNA 8.6 MG PO TABS
2.0000 | ORAL_TABLET | Freq: Every day | ORAL | Status: DC
  Administered 2024-04-20: 17.2 mg via ORAL
  Filled 2024-04-20: qty 2

## 2024-04-20 MED ORDER — METOPROLOL SUCCINATE ER 50 MG PO TB24
50.0000 mg | ORAL_TABLET | Freq: Every day | ORAL | Status: DC
  Administered 2024-04-21: 50 mg via ORAL
  Filled 2024-04-20: qty 1

## 2024-04-20 MED ORDER — FENTANYL CITRATE (PF) 100 MCG/2ML IJ SOLN
INTRAMUSCULAR | Status: AC
  Filled 2024-04-20: qty 2

## 2024-04-20 SURGICAL SUPPLY — 37 items
BAG COUNTER SPONGE SURGICOUNT (BAG) IMPLANT
BAG ZIPLOCK 12X15 (MISCELLANEOUS) ×2 IMPLANT
BLADE SAG 18X100X1.27 (BLADE) ×2 IMPLANT
COVER PERINEAL POST (MISCELLANEOUS) ×2 IMPLANT
COVER SURGICAL LIGHT HANDLE (MISCELLANEOUS) ×2 IMPLANT
CUP ACETBLR 52 OD PINNACLE (Hips) IMPLANT
DERMABOND ADVANCED .7 DNX12 (GAUZE/BANDAGES/DRESSINGS) ×2 IMPLANT
DRAPE FOOT SWITCH (DRAPES) ×2 IMPLANT
DRAPE STERI IOBAN 125X83 (DRAPES) ×2 IMPLANT
DRAPE U-SHAPE 47X51 STRL (DRAPES) ×4 IMPLANT
DRESSING AQUACEL AG SP 3.5X10 (GAUZE/BANDAGES/DRESSINGS) ×2 IMPLANT
DURAPREP 26ML APPLICATOR (WOUND CARE) ×2 IMPLANT
ELECT REM PT RETURN 15FT ADLT (MISCELLANEOUS) ×2 IMPLANT
FEMORAL STEM 12/14 TPR SZ4 HIP (Orthopedic Implant) IMPLANT
GLOVE BIO SURGEON STRL SZ 6 (GLOVE) ×2 IMPLANT
GLOVE BIOGEL PI IND STRL 6.5 (GLOVE) ×2 IMPLANT
GLOVE BIOGEL PI IND STRL 7.5 (GLOVE) ×2 IMPLANT
GLOVE ORTHO TXT STRL SZ7.5 (GLOVE) ×4 IMPLANT
GOWN STRL REUS W/ TWL LRG LVL3 (GOWN DISPOSABLE) ×4 IMPLANT
HEAD CERAMIC DELTA 36 PLUS 1.5 (Hips) IMPLANT
HOLDER FOLEY CATH W/STRAP (MISCELLANEOUS) ×2 IMPLANT
KIT TURNOVER KIT A (KITS) ×2 IMPLANT
LINER NEUTRAL 52X36MM PLUS 4 (Liner) IMPLANT
MANIFOLD NEPTUNE II (INSTRUMENTS) ×2 IMPLANT
NDL SAFETY ECLIPSE 18X1.5 (NEEDLE) ×2 IMPLANT
PACK ANTERIOR HIP CUSTOM (KITS) ×2 IMPLANT
PENCIL SMOKE EVACUATOR (MISCELLANEOUS) ×2 IMPLANT
SCREW 6.5MMX30MM (Screw) IMPLANT
SUT MNCRL AB 4-0 PS2 18 (SUTURE) ×2 IMPLANT
SUT VIC AB 1 CT1 36 (SUTURE) ×6 IMPLANT
SUT VIC AB 2-0 CT1 TAPERPNT 27 (SUTURE) ×4 IMPLANT
SUTURE STRATFX 0 PDS 27 VIOLET (SUTURE) ×2 IMPLANT
SYR 3ML LL SCALE MARK (SYRINGE) ×2 IMPLANT
TOWEL GREEN STERILE FF (TOWEL DISPOSABLE) ×2 IMPLANT
TRAY FOLEY MTR SLVR 16FR STAT (SET/KITS/TRAYS/PACK) ×2 IMPLANT
TUBE SUCTION HIGH CAP CLEAR NV (SUCTIONS) ×2 IMPLANT
WATER STERILE IRR 1000ML POUR (IV SOLUTION) ×2 IMPLANT

## 2024-04-20 NOTE — Evaluation (Signed)
 Physical Therapy Evaluation Patient Details Name: Joanne Mendoza MRN: [id number] DOB: Mar 27, 1946 Today's Date: 04/20/2024  History of Present Illness  Pt is a 78 year old female s/p L THA 04/20/24.  Clinical Impression  Pt is s/p THA resulting in the deficits listed below (see PT Problem List). Pt agreeable to session, 2 sons present in room with her. She is able to complete bed mobility at [facility], maintains sitting balance but reports feeling woozy, passes with time. Sit to Stand CGA then maintains balance with support of walker. Able to march in place with bilateral axillary support and RW. Returns to sitting and has emesis, nursing notified and provided meds. Pt returns to supine and is resting comfortably. Denies questions, comments or concerns. Pt will benefit from acute skilled PT to increase their independence and safety with mobility to facilitate discharge.   BP supine with HOB at 30 degrees: 145/68 Sitting: 145/66 Standing 134/71 O2 sats 95% + throughout session         If plan is discharge home, recommend the following: A little help with walking and/or transfers;A little help with bathing/dressing/bathroom;Assistance with cooking/housework;Help with stairs or ramp for entrance;Assist for transportation   Can travel by private vehicle        Equipment Recommendations Rolling walker (2 wheels);Other (comment) (tub bench)  Recommendations for Other Services       Functional Status Assessment Patient has had a recent decline in their functional status and demonstrates the ability to make significant improvements in function in a reasonable and predictable amount of time.     Precautions / Restrictions Precautions Precautions: Fall Recall of Precautions/Restrictions: Intact Restrictions Weight Bearing Restrictions Per Provider Order: Yes LLE Weight Bearing Per Provider Order: Weight bearing as tolerated      Mobility  Bed Mobility Overal bed mobility: Needs  Assistance Bed Mobility: Supine to Sit, Sit to Supine     Supine to sit: Contact guard Sit to supine: Min assist   General bed mobility comments: LE management    Transfers Overall transfer level: Needs assistance Equipment used: Rolling walker (2 wheels) Transfers: Sit to/from Stand Sit to Stand: Contact guard assist           General transfer comment: Pt has decreased LLE sensation, but is able to stand with CGA and demonstrate stability with RW support. Pt feels nauseus with changes in position, but resolves with time. After returning to sitting from standing, pt has emesis, nursing notified and provided meds.    Ambulation/Gait                  Stairs            Wheelchair Mobility     Tilt Bed    Modified Rankin (Stroke Patients Only)       Balance Overall balance assessment: Needs assistance Sitting-balance support: Feet supported Sitting balance-Leahy Scale: Good     Standing balance support: During functional activity, Reliant on assistive device for balance Standing balance-Leahy Scale: Fair                               Pertinent Vitals/Pain Pain Assessment Pain Assessment: Faces Faces Pain Scale: Hurts even more Pain Location: L hip with lying supine Pain Descriptors / Indicators: Aching, Discomfort, Grimacing, Operative site guarding, Sharp Pain Intervention(s): Limited activity within patient's tolerance, Monitored during session, Repositioned    Home Living Family/patient expects to be discharged to:: Private residence Living  Arrangements: Alone Available Help at Discharge: Family;Available 24 hours/day Type of Home: House Home Access: Stairs to enter Entrance Stairs-Rails: Can reach both;Left;Right Entrance Stairs-Number of Steps: 3   Home Layout: Able to live on main level with bedroom/bathroom;One level (upstairs not used) Home Equipment: Grab bars - tub/shower      Prior Function Prior Level of Function :  Independent/Modified Independent                     Extremity/Trunk Assessment   Upper Extremity Assessment Upper Extremity Assessment: Overall WFL for tasks assessed    Lower Extremity Assessment Lower Extremity Assessment: Generalized weakness;LLE deficits/detail LLE Deficits / Details: limited strength and WB tol    Cervical / Trunk Assessment Cervical / Trunk Assessment: Normal  Communication   Communication Communication: Impaired Factors Affecting Communication: Hearing impaired    Cognition Arousal: Alert Behavior During Therapy: WFL for tasks assessed/performed   PT - Cognitive impairments: No apparent impairments                         Following commands: Intact       Cueing Cueing Techniques: Verbal cues, Gestural cues, Tactile cues     General Comments General comments (skin integrity, edema, etc.): Pt reports feeling groggy and has nausea and vomiting this session    Exercises Total Joint Exercises Marching in Standing: AROM, Both, 10 reps, Standing   Assessment/Plan    PT Assessment Patient needs continued PT services  PT Problem List Decreased strength;Decreased balance;Decreased range of motion;Decreased mobility;Decreased activity tolerance       PT Treatment Interventions DME instruction;Functional mobility training;Balance training;Patient/family education;Therapeutic activities;Gait training;Stair training;Therapeutic exercise    PT Goals (Current goals can be found in the Care Plan section)  Acute Rehab PT Goals Patient Stated Goal: improve alertness and decrease pain, return home PT Goal Formulation: With patient Time For Goal Achievement: 05/04/24 Potential to Achieve Goals: Good    Frequency 7X/week     Co-evaluation               AM-PAC PT 6 Clicks Mobility  Outcome Measure Help needed turning from your back to your side while in a flat bed without using bedrails?: A Little Help needed moving from  lying on your back to sitting on the side of a flat bed without using bedrails?: A Little Help needed moving to and from a bed to a chair (including a wheelchair)?: A Lot Help needed standing up from a chair using your arms (e.g., wheelchair or bedside chair)?: A Little Help needed to walk in hospital room?: Total Help needed climbing 3-5 steps with a railing? : Total 6 Click Score: 13    End of Session Equipment Utilized During Treatment: Gait belt Activity Tolerance: Treatment limited secondary to medical complications (Comment);Patient limited by lethargy (nausea/vomiting) Patient left: in bed;with call bell/phone within reach;with bed alarm set;with family/visitor present Nurse Communication: Mobility status PT Visit Diagnosis: Unsteadiness on feet (R26.81);Difficulty in walking, not elsewhere classified (R26.2);Pain Pain - Right/Left: Left Pain - part of body: Hip    Time: 8375-8292 PT Time Calculation (min) (ACUTE ONLY): 43 min   Charges:   PT Evaluation $PT Eval Low Complexity: 1 Low PT Treatments $Therapeutic Activity: 23-37 mins PT General Charges $$ ACUTE PT VISIT: 1 Visit         Joanne, PT Acute Rehabilitation Services Office: [phone] 04/20/2024   Joanne Mendoza 04/20/2024, 5:20 PM

## 2024-04-20 NOTE — Anesthesia Preprocedure Evaluation (Signed)
 Anesthesia Evaluation  Patient identified by MRN, date of birth, ID band Patient awake    Reviewed: Allergy & Precautions, H&P , NPO status , Patient's Chart, lab work & pertinent test results  Airway Mallampati: II   Neck ROM: full    Dental   Pulmonary neg pulmonary ROS   breath sounds clear to auscultation       Cardiovascular hypertension,  Rhythm:regular Rate:Normal     Neuro/Psych    GI/Hepatic   Endo/Other  diabetes, Type 2    Renal/GU      Musculoskeletal  (+) Arthritis ,    Abdominal   Peds  Hematology   Anesthesia Other Findings   Reproductive/Obstetrics                              Anesthesia Physical Anesthesia Plan  ASA: 2  Anesthesia Plan: MAC and Spinal   Post-op Pain Management:    Induction: Intravenous  PONV Risk Score and Plan: 2 and Propofol  infusion and Treatment may vary due to age or medical condition  Airway Management Planned: Simple Face Mask  Additional Equipment:   Intra-op Plan:   Post-operative Plan:   Informed Consent: I have reviewed the patients History and Physical, chart, labs and discussed the procedure including the risks, benefits and alternatives for the proposed anesthesia with the patient or authorized representative who has indicated his/her understanding and acceptance.     Dental advisory given  Plan Discussed with: CRNA, Anesthesiologist and Surgeon  Anesthesia Plan Comments:         Anesthesia Quick Evaluation

## 2024-04-20 NOTE — Interval H&P Note (Signed)
 History and Physical Interval Note:  04/20/2024 10:48 AM  Joanne Mendoza  has presented today for surgery, with the diagnosis of Left hip osteoarthritis.  The various methods of treatment have been discussed with the patient and family. After consideration of risks, benefits and other options for treatment, the patient has consented to  Procedure(s): ARTHROPLASTY, HIP, TOTAL, ANTERIOR APPROACH (Left) as a surgical intervention.  The patient's history has been reviewed, patient examined, no change in status, stable for surgery.  I have reviewed the patient's chart and labs.  Questions were answered to the patient's satisfaction.     [name]

## 2024-04-20 NOTE — Transfer of Care (Signed)
 Immediate Anesthesia Transfer of Care Note  Patient: Joanne Mendoza  Procedure(s) Performed: ARTHROPLASTY, HIP, TOTAL, ANTERIOR APPROACH (Left: Hip)  Patient Location: PACU  Anesthesia Type:MAC and Spinal  Level of Consciousness: awake and alert   Airway & Oxygen Therapy: Patient Spontanous Breathing and Patient connected to face mask oxygen  Post-op Assessment: Report given to RN and Post -op Vital signs reviewed and stable  Post vital signs: Reviewed and stable  Last Vitals:  Vitals Value Taken Time  BP 118/72 04/20/24 13:53  Temp    Pulse 86 04/20/24 13:56  Resp 16 04/20/24 13:56  SpO2 96 % 04/20/24 13:56  Vitals shown include unfiled device data.  Last Pain:  Vitals:   04/20/24 1025  TempSrc: Oral  PainSc:       Patients Stated Pain Goal: 4 (04/20/24 1016)  Complications: No notable events documented.

## 2024-04-20 NOTE — Discharge Instructions (Signed)
 Dr. Donnice Car Total Joint Specialist [facility] [address], [address] [place], [place] [address] [phone]  Pain Regimen Instructions:  - Take tylenol  1000 mg every 6-8 hours around the clock  - Take the muscle relaxant around the clock for thigh pain  - Take celebrex daily - Then take the pain medication (tramadol ) 1-2 tablets every 6 hours as needed for severe pain  - Ice and elevate your leg as often as you can  Do not take over the counter pain medication until instructed by us .  If this is not controlling your pain, or you need refills - call our office at [phone] or send a message via the [employer] portal.   Take the stool softeners provided until you are having regular bowel movements, and then you may discontinue.    INSTRUCTIONS AFTER JOINT REPLACEMENT   Remove items at home which could result in a fall. This includes throw rugs or furniture in walking pathways ICE to the affected joint every three hours while awake for 30 minutes at a time, for at least the first 3-5 days, and then as needed for pain and swelling.  Continue to use ice for pain and swelling. You may notice swelling that will progress down to the foot and ankle.  This is normal after surgery.  Elevate your leg when you are not up walking on it.   Continue to use the breathing machine you got in the hospital (incentive spirometer) which will help keep your temperature down.  It is common for your temperature to cycle up and down following surgery, especially at night when you are not up moving around and exerting yourself.  The breathing machine keeps your lungs expanded and your temperature down.   DIET:  As you were doing prior to hospitalization, we recommend a well-balanced diet.  DRESSING / WOUND CARE / SHOWERING  Keep the surgical dressing until follow up.  The dressing is water  proof, so you can shower without any extra covering.  IF THE DRESSING FALLS OFF or the wound  gets wet inside, change the dressing with sterile gauze.  Please use good hand washing techniques before changing the dressing.  Do not use any lotions or creams on the incision until instructed by your surgeon.    ACTIVITY  Increase activity slowly as tolerated, but follow the weight bearing instructions below.   No driving for 6 weeks or until further direction given by your physician.  You cannot drive while taking narcotics.  No lifting or carrying greater than 10 lbs. until further directed by your surgeon. Avoid periods of inactivity such as sitting longer than an hour when not asleep. This helps prevent blood clots.  You may return to work once you are authorized by your doctor.     WEIGHT BEARING   Weight bearing as tolerated with assist device (walker, cane, etc) as directed, use it as long as suggested by your surgeon or therapist, typically at least 4-6 weeks.   EXERCISES  Results after joint replacement surgery are often greatly improved when you follow the exercise, range of motion and muscle strengthening exercises prescribed by your doctor. Safety measures are also important to protect the joint from further injury. Any time any of these exercises cause you to have increased pain or swelling, decrease what you are doing until you are comfortable again and then slowly increase them. If you have problems or questions, call your caregiver or physical therapist for advice.   Rehabilitation  is important following a joint replacement. After just a few days of immobilization, the muscles of the leg can become weakened and shrink (atrophy).  These exercises are designed to build up the tone and strength of the thigh and leg muscles and to improve motion. Often times heat used for twenty to thirty minutes before working out will loosen up your tissues and help with improving the range of motion but do not use heat for the first two weeks following surgery (sometimes heat can increase  post-operative swelling).   These exercises can be done on a training (exercise) mat, on the floor, on a table or on a bed. Use whatever works the best and is most comfortable for you.    Use music or television while you are exercising so that the exercises are a pleasant break in your day. This will make your life better with the exercises acting as a break in your routine that you can look forward to.   Perform all exercises about fifteen times, three times per day or as directed.  You should exercise both the operative leg and the other leg as well.  Exercises include:   Quad Sets - Tighten up the muscle on the front of the thigh (Quad) and hold for 5-10 seconds.   Straight Leg Raises - With your knee straight (if you were given a brace, keep it on), lift the leg to 60 degrees, hold for 3 seconds, and slowly lower the leg.  Perform this exercise against resistance later as your leg gets stronger.  Leg Slides: Lying on your back, slowly slide your foot toward your buttocks, bending your knee up off the floor (only go as far as is comfortable). Then slowly slide your foot back down until your leg is flat on the floor again.  Angel Wings: Lying on your back spread your legs to the side as far apart as you can without causing discomfort.  Hamstring Strength:  Lying on your back, push your heel against the floor with your leg straight by tightening up the muscles of your buttocks.  Repeat, but this time bend your knee to a comfortable angle, and push your heel against the floor.  You may put a pillow under the heel to make it more comfortable if necessary.   A rehabilitation program following joint replacement surgery can speed recovery and prevent re-injury in the future due to weakened muscles. Contact your doctor or a physical therapist for more information on knee rehabilitation.    CONSTIPATION  Constipation is defined medically as fewer than three stools per week and severe constipation as less  than one stool per week.  Even if you have a regular bowel pattern at home, your normal regimen is likely to be disrupted due to multiple reasons following surgery.  Combination of anesthesia, postoperative narcotics, change in appetite and fluid intake all can affect your bowels.   YOU MUST use at least one of the following options; they are listed in order of increasing strength to get the job done.  They are all available over the counter, and you may need to use some, POSSIBLY even all of these options:    Drink plenty of fluids (prune juice may be helpful) and high fiber foods Colace 100 mg by mouth twice a day  Senokot for constipation as directed and as needed Dulcolax (bisacodyl ), take with full glass of water   Miralax  (polyethylene glycol) once or twice a day as needed.  If you have tried  all these things and are unable to have a bowel movement in the first 3-4 days after surgery call either your surgeon or your primary doctor.    If you experience loose stools or diarrhea, hold the medications until you stool forms back up.  If your symptoms do not get better within 1 week or if they get worse, check with your doctor.  If you experience the worst abdominal pain ever or develop nausea or vomiting, please contact the office immediately for further recommendations for treatment.   ITCHING:  If you experience itching with your medications, try taking only a single pain pill, or even half a pain pill at a time.  You can also use Benadryl  over the counter for itching or also to help with sleep.   TED HOSE STOCKINGS:  Use stockings on both legs until for at least 2 weeks or as directed by physician office. They may be removed at night for sleeping.  MEDICATIONS:  See your medication summary on the "After Visit Summary" that nursing will review with you.  You may have some home medications which will be placed on hold until you complete the course of blood thinner medication.  It is important  for you to complete the blood thinner medication as prescribed.  PRECAUTIONS:  If you experience chest pain or shortness of breath - call 911 immediately for transfer to the hospital emergency department.   If you develop a fever greater that 101 F, purulent drainage from wound, increased redness or drainage from wound, foul odor from the wound/dressing, or calf pain - CONTACT YOUR SURGEON.                                                   FOLLOW-UP APPOINTMENTS:  If you do not already have a post-op appointment, please call the office for an appointment to be seen by your surgeon.  Guidelines for how soon to be seen are listed in your "After Visit Summary", but are typically between 1-4 weeks after surgery.  OTHER INSTRUCTIONS:   Knee Replacement:  Do not place pillow under knee, focus on keeping the knee straight while resting. CPM instructions: 0-90 degrees, 2 hours in the morning, 2 hours in the afternoon, and 2 hours in the evening. Place foam block, curve side up under heel at all times except when in CPM or when walking.  DO NOT modify, tear, cut, or change the foam block in any way.  POST-OPERATIVE OPIOID TAPER INSTRUCTIONS: It is important to wean off of your opioid medication as soon as possible. If you do not need pain medication after your surgery it is ok to stop day one. Opioids include: Codeine, Hydrocodone(Norco, Vicodin), Oxycodone (Percocet, oxycontin ) and hydromorphone  amongst others.  Long term and even short term use of opiods can cause: Increased pain response Dependence Constipation Depression Respiratory depression And more.  Withdrawal symptoms can include Flu like symptoms Nausea, vomiting And more Techniques to manage these symptoms Hydrate well Eat regular healthy meals Stay active Use relaxation techniques(deep breathing, meditating, yoga) Do Not substitute Alcohol to help with tapering If you have been on opioids for less than two weeks and do not have  pain than it is ok to stop all together.  Plan to wean off of opioids This plan should start within one week post op of your joint replacement.  Maintain the same interval or time between taking each dose and first decrease the dose.  Cut the total daily intake of opioids by one tablet each day Next start to increase the time between doses. The last dose that should be eliminated is the evening dose.   MAKE SURE YOU:  Understand these instructions.  Get help right away if you are not doing well or get worse.    Thank you for letting us  be a part of your medical care team.  It is a privilege we respect greatly.  We hope these instructions will help you stay on track for a fast and full recovery!

## 2024-04-20 NOTE — Care Plan (Signed)
 Ortho Bundle Case Management Note  Patient Details  Name: Joanne Mendoza MRN: [id number] Date of Birth: [birth year]  L THA on 04/20/24   DCP: Home with 2 sons for about 10 days DME: RW ordered through [facility] PT: HEP                   DME Arranged:  Walker rolling DME Agency:  [employer]  HH Arranged:    HH Agency:     Additional Comments: Please contact me with any questions of if this plan should need to change.  Lyle Pepper, [place] [facility] [phone]   04/20/2024, 12:19 PM

## 2024-04-20 NOTE — Anesthesia Postprocedure Evaluation (Signed)
 Anesthesia Post Note  Patient: Joanne Mendoza  Procedure(s) Performed: ARTHROPLASTY, HIP, TOTAL, ANTERIOR APPROACH (Left: Hip)     Patient location during evaluation: PACU Anesthesia Type: MAC and Spinal Level of consciousness: oriented and awake and alert Pain management: pain level controlled Vital Signs Assessment: post-procedure vital signs reviewed and stable Respiratory status: spontaneous breathing, respiratory function stable and patient connected to nasal cannula oxygen Cardiovascular status: blood pressure returned to baseline and stable Postop Assessment: no headache, no backache and no apparent nausea or vomiting Anesthetic complications: no   No notable events documented.  Last Vitals:  Vitals:   04/20/24 1458 04/20/24 1458  BP: 139/69 139/69  Pulse: 82 85  Resp: 16 17  Temp:  36.7 C  SpO2: 98% 100%    Last Pain:  Vitals:   04/20/24 1458  TempSrc:   PainSc: 5                  [name] S

## 2024-04-20 NOTE — Op Note (Signed)
 NAME:  Joanne Mendoza                ACCOUNT NO.: [id number]      MEDICAL RECORD NO.: [id number]      FACILITY:  [facility]      PHYSICIAN:  [name]  DATE OF BIRTH:  [birth year]     DATE OF PROCEDURE:  04/20/2024                                 OPERATIVE REPORT         PREOPERATIVE DIAGNOSIS: Left  hip osteoarthritis.      POSTOPERATIVE DIAGNOSIS:  Left hip osteoarthritis.      PROCEDURE:  Left total hip replacement through an anterior approach   utilizing DePuy THR system, component size 52 mm pinnacle cup, a size 36+4 neutral   Altrex liner, a size 4 Hi Actis stem with a 36+1.5 delta ceramic   ball.      SURGEON:  Donnice JONETTA. Car, M.D.      ASSISTANT:  Rosina Calin, PA-C     ANESTHESIA:  Spinal.      SPECIMENS:  None.      COMPLICATIONS:  None.      BLOOD LOSS:  300 cc     DRAINS:  None.      INDICATION OF THE PROCEDURE:  Joanne Mendoza is a 78 y.o. female who had   presented to office for evaluation of left hip pain.  Radiographs revealed   progressive degenerative changes with bone-on-bone   articulation of the  hip joint, including subchondral cystic changes and osteophytes.  The patient had painful limited range of   motion significantly affecting their overall quality of life and function.  The patient was failing to    respond to conservative measures including medications and/or injections and activity modification and at this point was ready   to proceed with more definitive measures.  Consent was obtained for   benefit of pain relief.  Specific risks of infection, DVT, component   failure, dislocation, neurovascular injury, and need for revision surgery were reviewed in the office.     PROCEDURE IN DETAIL:  The patient was brought to operative theater.   Once adequate anesthesia, preoperative antibiotics, 2 gm of Ancef , 1 gm of Tranexamic Acid , and 10 mg of Decadron  were administered, the patient was positioned supine on the [employer] table.  Once the patient was safely positioned with adequate padding of boney prominences we predraped out the hip, and used fluoroscopy to confirm orientation of the pelvis.      The left hip was then prepped and draped from proximal iliac crest to   mid thigh with a shower curtain technique.      Time-out was performed identifying the patient, planned procedure, and the appropriate extremity.     An incision was then made 2 cm lateral to the   anterior superior iliac spine extending over the orientation of the   tensor fascia lata muscle and sharp dissection was carried down to the   fascia of the muscle.      The fascia was then incised.  The muscle belly was identified and swept   laterally and retractor placed along the superior neck.  Following   cauterization of the circumflex vessels and removing some pericapsular   fat, a second cobra retractor was placed on the inferior  neck.  A T-capsulotomy was made along the line of the   superior neck to the trochanteric fossa, then extended proximally and   distally.  Tag sutures were placed and the retractors were then placed   intracapsular.  We then identified the trochanteric fossa and   orientation of my neck cut and then made a neck osteotomy with the femur on traction.  The femoral   head was removed without difficulty or complication.  Traction was let   off and retractors were placed posterior and anterior around the   acetabulum.      The labrum and foveal tissue were debrided.  I began reaming with a 45 mm   reamer and reamed up to 51 mm reamer with good bony bed preparation and a 52 mm  cup was chosen.  The final 52 mm Pinnacle cup was then impacted under fluoroscopy to confirm the depth of penetration and orientation with respect to   Abduction and forward flexion.  A screw was placed into the ilium followed by the hole eliminator.  The final   36+4 neutral Altrex liner was impacted with good visualized rim fit.  The cup  was positioned anatomically within the acetabular portion of the pelvis.      At this point, the femur was rolled to 100 degrees.  Further capsule was   released off the inferior aspect of the femoral neck.  I then   released the superior capsule proximally.  With the leg in a neutral position the hook was placed laterally   along the femur under the vastus lateralis origin and elevated manually and then held in position using the hook attachment on the bed.  The leg was then extended and adducted with the leg rolled to 100   degrees of external rotation.  Retractors were placed along the medial calcar and posteriorly over the greater trochanter.  Once the proximal femur was fully   exposed, I used a box osteotome to set orientation.  I then began   broaching with the starting chili pepper broach and passed this by hand and then broached up to 4.  With the 4 broach in place I chose a high offset neck and did several trial reductions.  The offset was appropriate, leg lengths   appeared to be equal best matched with the +1.5 head ball trial confirmed radiographically.   Given these findings, I went ahead and dislocated the hip, repositioned all   retractors and positioned the right hip in the extended and abducted position.  The final 4 Hi Actis stem was   chosen and it was impacted down to the level of neck cut.  Based on this   and the trial reductions, a final 36+1.5 delta ceramic ball was chosen and   impacted onto a clean and dry trunnion, and the hip was reduced.  The   hip had been irrigated throughout the case again at this point.  I did   reapproximate the superior capsular leaflet to the anterior leaflet   using #1 Vicryl.  The fascia of the   tensor fascia lata muscle was then reapproximated using #1 Vicryl and #0 Stratafix sutures.  The   remaining wound was closed with 2-0 Vicryl and running 4-0 Monocryl.   The hip was cleaned, dried, and dressed sterilely using Dermabond and    Aquacel dressing.  The patient was then brought   to recovery room in stable condition tolerating the procedure well.    Rosina  Patti, PA-C was present for the entirety of the case involved from   preoperative positioning, perioperative retractor management, general   facilitation of the case, as well as primary wound closure as assistant.            Donnice CORDOBA Ernie, M.D.        04/20/2024 10:49 AM

## 2024-04-20 NOTE — Anesthesia Procedure Notes (Signed)
 Spinal  Patient location during procedure: OR Start time: 04/20/2024 12:25 PM End time: 04/20/2024 12:28 PM Reason for block: surgical anesthesia Staffing Performed: anesthesiologist  Anesthesiologist: Maryclare Cornet, MD Performed by: Maryclare Cornet, MD Authorized by: Maryclare Cornet, MD   Preanesthetic Checklist Completed: patient identified, IV checked, risks and benefits discussed, surgical consent, monitors and equipment checked, pre-op evaluation and timeout performed Spinal Block Patient position: sitting Prep: DuraPrep Patient monitoring: cardiac monitor, continuous pulse ox and blood pressure Approach: midline Location: L3-4 Injection technique: single-shot Needle Needle type: Pencan  Needle gauge: 24 G Needle length: 9 cm Assessment Sensory level: T10 Events: CSF return Additional Notes Functioning IV was confirmed and monitors were applied. Sterile prep and drape, including hand hygiene and sterile gloves were used. The patient was positioned and the spine was prepped. The skin was anesthetized with lidocaine.  Free flow of clear CSF was obtained prior to injecting local anesthetic into the CSF.  The spinal needle aspirated freely following injection.  The needle was carefully withdrawn.  The patient tolerated the procedure well.

## 2024-04-21 ENCOUNTER — Other Ambulatory Visit (HOSPITAL_COMMUNITY): Payer: Self-pay

## 2024-04-21 DIAGNOSIS — E119 Type 2 diabetes mellitus without complications: Secondary | ICD-10-CM | POA: Diagnosis not present

## 2024-04-21 DIAGNOSIS — Z880 Allergy status to penicillin: Secondary | ICD-10-CM | POA: Diagnosis not present

## 2024-04-21 DIAGNOSIS — Z79899 Other long term (current) drug therapy: Secondary | ICD-10-CM | POA: Diagnosis not present

## 2024-04-21 DIAGNOSIS — M1612 Unilateral primary osteoarthritis, left hip: Secondary | ICD-10-CM | POA: Diagnosis not present

## 2024-04-21 DIAGNOSIS — I10 Essential (primary) hypertension: Secondary | ICD-10-CM | POA: Diagnosis not present

## 2024-04-21 DIAGNOSIS — M25552 Pain in left hip: Secondary | ICD-10-CM | POA: Diagnosis not present

## 2024-04-21 DIAGNOSIS — Z7984 Long term (current) use of oral hypoglycemic drugs: Secondary | ICD-10-CM | POA: Diagnosis not present

## 2024-04-21 LAB — BASIC METABOLIC PANEL WITH GFR
Anion gap: 8 (ref 5–15)
BUN: 9 mg/dL (ref 8–23)
CO2: 25 mmol/L (ref 22–32)
Calcium: 8.6 mg/dL — ABNORMAL LOW (ref 8.9–10.3)
Chloride: 95 mmol/L — ABNORMAL LOW (ref 98–111)
Creatinine, Ser: 0.44 mg/dL (ref 0.44–1.00)
GFR, Estimated: >60 mL/min (ref >60)
Glucose, Bld: 167 mg/dL — ABNORMAL HIGH (ref 70–99)
Potassium: 4.3 mmol/L (ref 3.5–5.1)
Sodium: 128 mmol/L — ABNORMAL LOW (ref 135–145)

## 2024-04-21 LAB — CBC
HCT: 30.6 % — ABNORMAL LOW (ref 36.0–46.0)
Hemoglobin: 10.4 g/dL — ABNORMAL LOW (ref 12.0–15.0)
MCH: 31.7 pg (ref 26.0–34.0)
MCHC: 34 g/dL (ref 30.0–36.0)
MCV: 93.3 fL (ref 80.0–100.0)
Platelets: 223 K/uL (ref 150–400)
RBC: 3.28 MIL/uL — ABNORMAL LOW (ref 3.87–5.11)
RDW: 12.7 % (ref 11.5–15.5)
WBC: 11.3 K/uL — ABNORMAL HIGH (ref 4.0–10.5)
nRBC: 0 % (ref 0.0–0.2)

## 2024-04-21 LAB — GLUCOSE, CAPILLARY: Glucose-Capillary: 137 mg/dL — ABNORMAL HIGH (ref 70–99)

## 2024-04-21 MED ORDER — SODIUM CHLORIDE 0.9 % IV BOLUS
250.0000 mL | Freq: Once | INTRAVENOUS | Status: AC
  Administered 2024-04-21: 250 mL via INTRAVENOUS

## 2024-04-21 MED ORDER — MUPIROCIN 2 % EX OINT
1.0000 | TOPICAL_OINTMENT | Freq: Two times a day (BID) | CUTANEOUS | 0 refills | Status: AC
Start: 2024-04-21 — End: 2024-05-21
  Filled 2024-04-21: qty 22, 11d supply, fill #0
  Filled 2024-04-21: qty 66, 33d supply, fill #0

## 2024-04-21 MED ORDER — SENNA 8.6 MG PO TABS
2.0000 | ORAL_TABLET | Freq: Every day | ORAL | 0 refills | Status: AC
  Filled 2024-04-21: qty 28, 14d supply, fill #0

## 2024-04-21 MED ORDER — POLYETHYLENE GLYCOL 3350 17 GM/SCOOP PO POWD
17.0000 g | Freq: Two times a day (BID) | ORAL | 0 refills | Status: AC
  Filled 2024-04-21: qty 238, 7d supply, fill #0

## 2024-04-21 MED ORDER — METHOCARBAMOL 500 MG PO TABS
500.0000 mg | ORAL_TABLET | Freq: Four times a day (QID) | ORAL | 2 refills | Status: DC | PRN
  Filled 2024-04-21: qty 40, 10d supply, fill #0

## 2024-04-21 MED ORDER — CHLORHEXIDINE GLUCONATE 4 % EX SOLN
1.0000 | CUTANEOUS | 1 refills | Status: AC
Start: 2024-04-21 — End: ?
  Filled 2024-04-21: qty 946, 30d supply, fill #0

## 2024-04-21 MED ORDER — TRAMADOL HCL 50 MG PO TABS
50.0000 mg | ORAL_TABLET | Freq: Four times a day (QID) | ORAL | 0 refills | Status: DC | PRN
  Filled 2024-04-21: qty 20, 5d supply, fill #0

## 2024-04-21 MED ORDER — KETOROLAC TROMETHAMINE 15 MG/ML IJ SOLN: 7.5000 mg | Freq: Four times a day (QID) | INTRAMUSCULAR | Status: DC

## 2024-04-21 MED ORDER — ASPIRIN 81 MG PO CHEW
81.0000 mg | CHEWABLE_TABLET | Freq: Two times a day (BID) | ORAL | 0 refills | Status: AC
  Filled 2024-04-21: qty 56, 28d supply, fill #0

## 2024-04-21 MED ORDER — KETOROLAC TROMETHAMINE 15 MG/ML IJ SOLN
7.5000 mg | Freq: Four times a day (QID) | INTRAMUSCULAR | Status: DC
  Administered 2024-04-21: 7.5 mg via INTRAVENOUS
  Filled 2024-04-21: qty 1

## 2024-04-21 NOTE — Plan of Care (Signed)
 Pt cautious about taking pain medication. Continuous education provided but patient want to stick with muscle relaxer or sch tylenol  only if possible. Ice therapy maintained. Pt was able to get up to edge of the bed moderate assist and walk from side to side with minimal assist.    Problem: Education: Goal: Knowledge of General Education information will improve Description: Including pain rating scale, medication(s)/side effects and non-pharmacologic comfort measures Outcome: Progressing   Problem: Health Behavior/Discharge Planning: Goal: Ability to manage health-related needs will improve Outcome: Progressing   Problem: Clinical Measurements: Goal: Ability to maintain clinical measurements within normal limits will improve Outcome: Progressing Goal: Will remain free from infection Outcome: Progressing Goal: Diagnostic test results will improve Outcome: Progressing Goal: Respiratory complications will improve Outcome: Progressing Goal: Cardiovascular complication will be avoided Outcome: Progressing   Problem: Activity: Goal: Risk for activity intolerance will decrease Outcome: Progressing   Problem: Nutrition: Goal: Adequate nutrition will be maintained Outcome: Progressing   Problem: Coping: Goal: Level of anxiety will decrease Outcome: Progressing   Problem: Elimination: Goal: Will not experience complications related to bowel motility Outcome: Progressing Goal: Will not experience complications related to urinary retention Outcome: Progressing   Problem: Pain Managment: Goal: General experience of comfort will improve and/or be controlled Outcome: Progressing   Problem: Safety: Goal: Ability to remain free from injury will improve Outcome: Progressing   Problem: Skin Integrity: Goal: Risk for impaired skin integrity will decrease Outcome: Progressing   Problem: Education: Goal: Ability to describe self-care measures that may prevent or decrease  complications (Diabetes Survival Skills Education) will improve Outcome: Progressing Goal: Individualized Educational Video(s) Outcome: Progressing   Problem: Coping: Goal: Ability to adjust to condition or change in health will improve Outcome: Progressing   Problem: Fluid Volume: Goal: Ability to maintain a balanced intake and output will improve Outcome: Progressing   Problem: Health Behavior/Discharge Planning: Goal: Ability to identify and utilize available resources and services will improve Outcome: Progressing Goal: Ability to manage health-related needs will improve Outcome: Progressing   Problem: Metabolic: Goal: Ability to maintain appropriate glucose levels will improve Outcome: Progressing   Problem: Nutritional: Goal: Maintenance of adequate nutrition will improve Outcome: Progressing Goal: Progress toward achieving an optimal weight will improve Outcome: Progressing   Problem: Skin Integrity: Goal: Risk for impaired skin integrity will decrease Outcome: Progressing   Problem: Tissue Perfusion: Goal: Adequacy of tissue perfusion will improve Outcome: Progressing   Problem: Education: Goal: Knowledge of the prescribed therapeutic regimen will improve Outcome: Progressing Goal: Understanding of discharge needs will improve Outcome: Progressing Goal: Individualized Educational Video(s) Outcome: Progressing   Problem: Activity: Goal: Ability to avoid complications of mobility impairment will improve Outcome: Progressing Goal: Ability to tolerate increased activity will improve Outcome: Progressing   Problem: Clinical Measurements: Goal: Postoperative complications will be avoided or minimized Outcome: Progressing   Problem: Pain Management: Goal: Pain level will decrease with appropriate interventions Outcome: Progressing   Problem: Skin Integrity: Goal: Will show signs of wound healing Outcome: Progressing

## 2024-04-21 NOTE — Progress Notes (Signed)
 Physical Therapy Treatment Patient Details Name: Joanne Mendoza MRN: [id number] DOB: 1946/01/09 Today's Date: 04/21/2024   History of Present Illness Pt is a 78 year old female s/p L THA 04/20/24.    PT Comments  POD # 1 am session Pt AxO x 3 pleasant and feeling better.  Assisted OOB, amb to bathroom, amb in hallway and practiced stairs. Extended Tx time for Family Education 2 supportive son's.   Will see Pt again a little later for HEP Education.    If plan is discharge home, recommend the following: A little help with walking and/or transfers;A little help with bathing/dressing/bathroom;Assistance with cooking/housework;Help with stairs or ramp for entrance;Assist for transportation   Can travel by private vehicle        Equipment Recommendations  Rolling walker (2 wheels)    Recommendations for Other Services       Precautions / Restrictions Precautions Precautions: Fall Restrictions Weight Bearing Restrictions Per Provider Order: No LLE Weight Bearing Per Provider Order: Weight bearing as tolerated     Mobility  Bed Mobility Overal bed mobility: Needs Assistance Bed Mobility: Supine to Sit           General bed mobility comments: demonstarted and educated on how to use a belt to self guide LE in/OOB.  Required increased time and effort.    Transfers Overall transfer level: Needs assistance Equipment used: Rolling walker (2 wheels) Transfers: Sit to/from Stand Sit to Stand: Supervision, Contact guard assist           General transfer comment: VC's on proper hand placement and safety with turns.  Also assisted with a toilet transfer.    Ambulation/Gait Ambulation/Gait assistance: Supervision, Contact guard assist Gait Distance (Feet): 55 Feet Assistive device: Rolling walker (2 wheels) Gait Pattern/deviations: Step-through pattern, Decreased stride length Gait velocity: decreased     General Gait Details: Pt tolerated amb to the bathroom then in  hallway with VC's on proper walker to self distance.  Had Son's hands on assist using safety belt.  No nausea today.   Stairs Stairs: Yes Stairs assistance: Contact guard assist, Supervision Stair Management: Two rails, Step to pattern Number of Stairs: 3 General stair comments: with both Son's hands on, practiced up/down 3 steps using B rails and Safety Belt VC's on proper sequencing and safety.   Wheelchair Mobility     Tilt Bed    Modified Rankin (Stroke Patients Only)       Balance                                            Communication Communication Communication: No apparent difficulties  Cognition Arousal: Alert Behavior During Therapy: WFL for tasks assessed/performed   PT - Cognitive impairments: No apparent impairments                       PT - Cognition Comments: AxO x 3 pleasant Lady with 2 very support Son's who will be assisting Pt at home.  Family Education performed. Following commands: Intact      Cueing Cueing Techniques: Verbal cues  Exercises      General Comments        Pertinent Vitals/Pain Pain Assessment Pain Assessment: 0-10 Pain Score: 4  Pain Location: L hip after Toradol  Pain Descriptors / Indicators: Discomfort, Grimacing, Operative site guarding Pain Intervention(s): Monitored during session, Ice applied  Home Living                          Prior Function            PT Goals (current goals can now be found in the care plan section) Progress towards PT goals: Progressing toward goals    Frequency    7X/week      PT Plan      Co-evaluation              AM-PAC PT 6 Clicks Mobility   Outcome Measure  Help needed turning from your back to your side while in a flat bed without using bedrails?: A Little Help needed moving from lying on your back to sitting on the side of a flat bed without using bedrails?: A Little Help needed moving to and from a bed to a  chair (including a wheelchair)?: A Little Help needed standing up from a chair using your arms (e.g., wheelchair or bedside chair)?: A Little Help needed to walk in hospital room?: A Little Help needed climbing 3-5 steps with a railing? : A Little 6 Click Score: 18    End of Session Equipment Utilized During Treatment: Gait belt Activity Tolerance: Treatment limited secondary to medical complications (Comment);Patient limited by lethargy Patient left: in chair;with call bell/phone within reach;with family/visitor present Nurse Communication: Mobility status PT Visit Diagnosis: Unsteadiness on feet (R26.81);Difficulty in walking, not elsewhere classified (R26.2);Pain Pain - Right/Left: Left Pain - part of body: Hip     Time: 8995-8941 PT Time Calculation (min) (ACUTE ONLY): 54 min  Charges:    $Gait Training: 23-37 mins $Therapeutic Activity: 8-22 mins $Self Care/Home Management: 8-22 PT General Charges $$ ACUTE PT VISIT: 1 Visit                     Katheryn Leap  PTA Acute  Rehabilitation Services Office M-F          [phone]

## 2024-04-21 NOTE — Care Management Obs Status (Signed)
 MEDICARE OBSERVATION STATUS NOTIFICATION   Patient Details  Name: Joanne Mendoza MRN: [id number] Date of Birth: [birth year]   Medicare Observation Status Notification Given:  Yes    Alfonse JONELLE Rex, RN 04/21/2024, 9:36 AM

## 2024-04-21 NOTE — Progress Notes (Signed)
   Subjective: 1 Day Post-Op Procedure(s) (LRB): ARTHROPLASTY, HIP, TOTAL, ANTERIOR APPROACH (Left) Patient reports pain as mild.   Patient seen in rounds for Dr. Ernie. Patient is resting in bed on exam this morning. No acute events overnight. Foley catheter removed. Patient ambulated a few feet with PT yesterday.She did vomit yesterday, but denies nausea today. She complains of stabbing pain in her buttock. We will start therapy today.   Objective: Vital signs in last 24 hours: Temp:  [97.3 F (36.3 C)-98.3 F (36.8 C)] 98.3 F (36.8 C) (08/20 0435) Pulse Rate:  [63-89] 63 (08/20 0435) Resp:  [15-23] 20 (08/20 0435) BP: (112-152)/(59-94) 112/60 (08/20 0435) SpO2:  [91 %-100 %] 93 % (08/20 0435) Weight:  [88.9 kg] 88.9 kg (08/19 1507)  Intake/Output from previous day:  Intake/Output Summary (Last 24 hours) at 04/21/2024 0840 Last data filed at 04/21/2024 0530 Gross per 24 hour  Intake 2252.81 ml  Output 2200 ml  Net 52.81 ml     Intake/Output this shift: No intake/output data recorded.  Labs: Recent Labs    04/21/24 0334  HGB 10.4*   Recent Labs    04/21/24 0334  WBC 11.3*  RBC 3.28*  HCT 30.6*  PLT 223   Recent Labs    04/21/24 0334  NA 128*  K 4.3  CL 95*  CO2 25  BUN 9  CREATININE 0.44  GLUCOSE 167*  CALCIUM  8.6*   No results for input(s): LABPT, INR in the last 72 hours.  Exam: General - Patient is Alert and Oriented Extremity - Neurologically intact Sensation intact distally Intact pulses distally Dorsiflexion/Plantar flexion intact Dressing - dressing C/D/I Motor Function - intact, moving foot and toes well on exam.   Past Medical History:  Diagnosis Date   Arthritis    Diabetes mellitus without complication (HCC)    Hyperlipidemia    Hypertension     Assessment/Plan: 1 Day Post-Op Procedure(s) (LRB): ARTHROPLASTY, HIP, TOTAL, ANTERIOR APPROACH (Left) Principal Problem:   S/P total left hip arthroplasty  Estimated body mass  index is 34.18 kg/m as calculated from the following:   Height as of this encounter: 5' 3.5 (1.613 m).   Weight as of this encounter: 88.9 kg. Advance diet Up with therapy D/C IV fluids  DVT Prophylaxis - Aspirin  Weight bearing as tolerated.  Hgb stable at 10.4 this AM Cr 0.44  Will give 2 more doses of toradol  today as she prefers minimal pain meds No hx of GI bleed, CKD   Plan is to go Home after hospital stay. Plan for discharge today after meeting goals with therapy. Follow up in the office in 2 weeks.   Rosina Calin, PA-C Orthopedic Surgery [phone] 04/21/2024, 8:40 AM

## 2024-04-21 NOTE — Progress Notes (Signed)
 Discharge meds handed off to L Horton LPN D Johnie RN

## 2024-04-21 NOTE — Progress Notes (Signed)
 Physical Therapy Treatment Patient Details Name: Joanne Mendoza MRN: [id number] DOB: 20-Apr-1946 Today's Date: 04/21/2024   History of Present Illness Pt is a 78 year old female s/p L THA 04/20/24.    PT Comments  POD # 1 pm session 2 Son's present Educated and performed all HEP following handout. General Gait Details: had both Son's assist with amb Pt in hallway to Pt tolerance.  Pt with improved steps wearing her personal shoes.   Addressed all mobility questions, discussed appropriate activity, educated on use of ICE.  Pt ready for D/C to home.    If plan is discharge home, recommend the following: A little help with walking and/or transfers;A little help with bathing/dressing/bathroom;Assistance with cooking/housework;Help with stairs or ramp for entrance;Assist for transportation   Can travel by private vehicle        Equipment Recommendations  Rolling walker (2 wheels)    Recommendations for Other Services       Precautions / Restrictions Precautions Precautions: Fall Restrictions Weight Bearing Restrictions Per Provider Order: No LLE Weight Bearing Per Provider Order: Weight bearing as tolerated     Mobility  Bed Mobility   General bed mobility comments: OOB in recliner    Transfers Overall transfer level: Needs assistance Equipment used: Rolling walker (2 wheels) Transfers: Sit to/from Stand Sit to Stand: Supervision, Contact guard assist           General transfer comment: Had Son hands on assist using safety belt.    Ambulation/Gait Ambulation/Gait assistance: Supervision, Contact guard assist Gait Distance (Feet): 42 Feet Assistive device: Rolling walker (2 wheels) Gait Pattern/deviations: Step-through pattern, Decreased stride length Gait velocity: decreased     General Gait Details: had both Son's assist with amb Pt in hallway to Pt tolerance.   Stairs  Tilt Bed    Modified Rankin (Stroke Patients Only)       Balance                                             Communication Communication Communication: No apparent difficulties  Cognition Arousal: Alert Behavior During Therapy: WFL for tasks assessed/performed   PT - Cognitive impairments: No apparent impairments                       PT - Cognition Comments: AxO x 3 pleasant Lady with 2 very support Son's who will be assisting Pt at home.  Family Education performed. Following commands: Intact      Cueing Cueing Techniques: Verbal cues  Exercises  Total Hip Replacement TE's following HEP Handout 10 reps ankle pumps 05 reps knee presses 05 reps heel slides 05 reps SAQ's 05 reps ABD 05 reps LAQ's 05 reps all standing TE's Instructed how to use a belt loop to assist  Followed by ICE     General Comments        Pertinent Vitals/Pain Pain Assessment Pain Assessment: 0-10 Pain Score: 4  Pain Location: L hip after Toradol  Pain Descriptors / Indicators: Discomfort, Grimacing, Operative site guarding Pain Intervention(s): Monitored during session, Ice applied    Home Living                          Prior Function            PT Goals (current goals can now be found  in the care plan section) Progress towards PT goals: Progressing toward goals    Frequency    7X/week      PT Plan      Co-evaluation              AM-PAC PT 6 Clicks Mobility   Outcome Measure  Help needed turning from your back to your side while in a flat bed without using bedrails?: A Little Help needed moving from lying on your back to sitting on the side of a flat bed without using bedrails?: A Little Help needed moving to and from a bed to a chair (including a wheelchair)?: A Little Help needed standing up from a chair using your arms (e.g., wheelchair or bedside chair)?: A Little Help needed to walk in hospital room?: A Little Help needed climbing 3-5 steps with a railing? : A Little 6 Click Score: 18    End of  Session Equipment Utilized During Treatment: Gait belt Activity Tolerance: Treatment limited secondary to medical complications (Comment);Patient limited by lethargy Patient left: in chair;with call bell/phone within reach;with family/visitor present Nurse Communication: Mobility status PT Visit Diagnosis: Unsteadiness on feet (R26.81);Difficulty in walking, not elsewhere classified (R26.2);Pain Pain - Right/Left: Left Pain - part of body: Hip     Time: 1140-1205 PT Time Calculation (min) (ACUTE ONLY): 25 min  Charges:    $Gait Training: 8-22 mins $Therapeutic Exercise: 8-22 mins PT General Charges $$ ACUTE PT VISIT: 1 Visit                     Katheryn Leap  PTA Acute  Rehabilitation Services Office M-F          [phone]

## 2024-04-21 NOTE — TOC Transition Note (Signed)
 Transition of Care [facility]) - Discharge Note   Patient Details  Name: Joanne Mendoza MRN: [id number] Date of Birth: 04/22/1946  Transition of Care [facility]) CM/SW Contact:  Alfonse JONELLE Rex, RN Phone Number: 04/21/2024, 11:32 AM   Clinical Narrative:  Met with patient at bedside to review dc therapy and home equipment needs, pt confirmed HEP, RW delivered to bedside by [employer]. MOON completed. No TOC needs.      Final next level of care: OP Rehab Barriers to Discharge: No Barriers Identified   Patient Goals and CMS Choice Patient states their goals for this hospitalization and ongoing recovery are:: return home          Discharge Placement                       Discharge Plan and Services Additional resources added to the After Visit Summary for                  DME Arranged: Walker rolling DME Agency: [employer]                  Social Drivers of Health (SDOH) Interventions SDOH Screenings   Food Insecurity: No Food Insecurity (04/20/2024)  Housing: Low Risk  (04/20/2024)  Transportation Needs: No Transportation Needs (04/20/2024)  Utilities: Not At Risk (04/20/2024)  Depression (PHQ2-9): Low Risk  (09/23/2023)  Financial Resource Strain: Low Risk  (07/10/2021)  Physical Activity: Inactive (07/10/2021)  Social Connections: Moderately Integrated (04/20/2024)  Stress: No Stress Concern Present (07/10/2021)  Tobacco Use: Low Risk  (04/20/2024)     Readmission Risk Interventions     No data to display

## 2024-04-22 ENCOUNTER — Encounter (HOSPITAL_COMMUNITY): Payer: Self-pay | Admitting: Orthopedic Surgery

## 2024-04-27 NOTE — Discharge Summary (Signed)
 Patient ID: Joanne Mendoza MRN: [id number] DOB/AGE: 1946/06/10 78 y.o.  Admit date: 04/20/2024 Discharge date: 04/21/2024  Admission Diagnoses:  Left hip osteoarthritis  Discharge Diagnoses:  Principal Problem:   S/P total left hip arthroplasty   Past Medical History:  Diagnosis Date   Arthritis    Diabetes mellitus without complication (HCC)    Hyperlipidemia    Hypertension     Surgeries: Procedure(s): ARTHROPLASTY, HIP, TOTAL, ANTERIOR APPROACH on 04/20/2024   Consultants:   Discharged Condition: Improved  Hospital Course: Joanne Mendoza is an 78 y.o. female who was admitted 04/20/2024 for operative treatment ofS/P total left hip arthroplasty. Patient has severe unremitting pain that affects sleep, daily activities, and work/hobbies. After pre-op clearance the patient was taken to the operating room on 04/20/2024 and underwent  Procedure(s): ARTHROPLASTY, HIP, TOTAL, ANTERIOR APPROACH.    Patient was given perioperative antibiotics:  Anti-infectives (From admission, onward)    Start     Dose/Rate Route Frequency Ordered Stop   04/20/24 1800  ceFAZolin  (ANCEF ) IVPB 2g/100 mL premix        2 g 200 mL/hr over 30 Minutes Intravenous Every 6 hours 04/20/24 1455 04/21/24 0118   04/20/24 1015  ceFAZolin  (ANCEF ) IVPB 2g/100 mL premix        2 g 200 mL/hr over 30 Minutes Intravenous On call to O.R. 04/20/24 1002 04/20/24 1215        Patient was given sequential compression devices, early ambulation, and chemoprophylaxis to prevent DVT. Patient worked with PT and was meeting their goals regarding safe ambulation and transfers.  Patient benefited maximally from hospital stay and there were no complications.    Recent vital signs: No data found.   Recent laboratory studies: No results for input(s): WBC, HGB, HCT, PLT, NA, K, CL, CO2, BUN, CREATININE, GLUCOSE, INR, CALCIUM  in the last 72 hours.  Invalid input(s): PT, 2   Discharge Medications:    Allergies as of 04/21/2024       Reactions   Darvon-n [propoxyphene]    Made her pain worse   Penicillins Hives        Medication List     STOP taking these medications    aspirin  EC 81 MG tablet Replaced by: Aspirin  Low Dose 81 MG chewable tablet       TAKE these medications    acetaminophen  325 MG tablet Commonly known as: TYLENOL  Take 650 mg by mouth every 6 (six) hours as needed for moderate pain (pain score 4-6).   amLODipine  5 MG tablet Commonly known as: NORVASC  TAKE 1 TABLET(5 MG) BY MOUTH DAILY   Aspirin  Low Dose 81 MG chewable tablet Generic drug: aspirin  Chew 1 tablet (81 mg total) by mouth 2 (two) times daily for 28 days. Replaces: aspirin  EC 81 MG tablet   atorvastatin  40 MG tablet Commonly known as: LIPITOR TAKE 1 TABLET(40 MG) BY MOUTH DAILY   Betasept  Surgical Scrub 4 % external liquid Generic drug: chlorhexidine  Apply 15 mLs (1 tablespoon) topically as directed daily for 5 days then every other week for 6 weeks.   losartan -hydrochlorothiazide  100-12.5 MG tablet Commonly known as: HYZAAR Take 1 tablet by mouth daily.   metFORMIN  500 MG tablet Commonly known as: GLUCOPHAGE  TAKE 2 TABLETS BY MOUTH TWICE DAILY   methocarbamol  500 MG tablet Commonly known as: ROBAXIN  Take 1 tablet (500 mg total) by mouth every 6 (six) hours as needed for muscle spasms (thigh pain).   metoprolol  succinate 50 MG 24 hr tablet Commonly known as: TOPROL -XL  TAKE 1 TABLET(50 MG) BY MOUTH EVERY DAY WITH OR IMMEDIATELY FOLLOWING A MEAL   mupirocin  ointment 2 % Commonly known as: BACTROBAN  Place 1 Application into the nose 2 (two) times daily for 60 doses. Use as directed 2 times daily for 5 days every other week for 6 weeks.   polyethylene glycol powder 17 GM/SCOOP powder Commonly known as: GLYCOLAX /MIRALAX  Take 17 g (1 capful) dissolved in water  by mouth 2 (two) times daily.   PreviDent 5000 Plus 1.1 % Crea dental cream Generic drug: sodium fluoride Place 1  Application onto teeth daily.   senna 8.6 MG Tabs tablet Commonly known as: SENOKOT Take 2 tablets (17.2 mg total) by mouth at bedtime for 14 days.   traMADol  50 MG tablet Commonly known as: Ultram  Take 1 tablet (50 mg total) by mouth every 6 (six) hours as needed.               Discharge Care Instructions  (From admission, onward)           Start     Ordered   04/21/24 0000  Change dressing       Comments: Maintain surgical dressing until follow up in the clinic. If the edges start to pull up, may reinforce with tape. If the dressing is no longer working, may remove and cover with gauze and tape, but must keep the area dry and clean.  Call with any questions or concerns.   04/21/24 0843            Diagnostic Studies: DG Pelvis Portable Result Date: 04/20/2024 CLINICAL DATA:  Left total hip arthroplasty EXAM: PORTABLE PELVIS 1-2 VIEWS COMPARISON:  10/01/2023 FINDINGS: Single frontal view of the pelvis includes both hips. Left hip arthroplasty is identified in the expected position without evidence of acute complication. Stable right hip osteoarthritis. Postsurgical changes in the soft tissues overlying the left hip. IMPRESSION: 1. Unremarkable left hip arthroplasty. Electronically Signed   By: Ozell Daring M.D.   On: 04/20/2024 15:39   DG HIP UNILAT WITH PELVIS 1V LEFT Result Date: 04/20/2024 CLINICAL DATA:  Left hip arthroplasty EXAM: DG HIP (WITH OR WITHOUT PELVIS) 1V*L* COMPARISON:  10/01/2023 FINDINGS: Two fluoroscopic images are obtained during the performance of the procedure and are provided for interpretation only. Left hip arthroplasty is identified in the expected position. Stable right hip osteoarthritis. Please refer to operative report. Fluoroscopy time: 7 seconds, 1.4752 mGy IMPRESSION: 1. Intraoperative evaluation as above. Please refer to the operative report. Electronically Signed   By: Ozell Daring M.D.   On: 04/20/2024 15:38   DG C-Arm 1-60 Min-No  Report Result Date: 04/20/2024 Fluoroscopy was utilized by the requesting physician.  No radiographic interpretation.   DG C-Arm 1-60 Min-No Report Result Date: 04/20/2024 Fluoroscopy was utilized by the requesting physician.  No radiographic interpretation.    Disposition: Discharge disposition: 01-Home or Self Care       Discharge Instructions     Call MD / Call 911   Complete by: As directed    If you experience chest pain or shortness of breath, CALL 911 and be transported to the hospital emergency room.  If you develope a fever above 101 F, pus (white drainage) or increased drainage or redness at the wound, or calf pain, call your surgeon's office.   Change dressing   Complete by: As directed    Maintain surgical dressing until follow up in the clinic. If the edges start to pull up, may reinforce with tape. If  the dressing is no longer working, may remove and cover with gauze and tape, but must keep the area dry and clean.  Call with any questions or concerns.   Constipation Prevention   Complete by: As directed    Drink plenty of fluids.  Prune juice may be helpful.  You may use a stool softener, such as Colace (over the counter) 100 mg twice a day.  Use MiraLax  (over the counter) for constipation as needed.   Diet - low sodium heart healthy   Complete by: As directed    Increase activity slowly as tolerated   Complete by: As directed    Weight bearing as tolerated with assist device (walker, cane, etc) as directed, use it as long as suggested by your surgeon or therapist, typically at least 4-6 weeks.   Post-operative opioid taper instructions:   Complete by: As directed    POST-OPERATIVE OPIOID TAPER INSTRUCTIONS: It is important to wean off of your opioid medication as soon as possible. If you do not need pain medication after your surgery it is ok to stop day one. Opioids include: Codeine, Hydrocodone(Norco, Vicodin), Oxycodone (Percocet, oxycontin ) and hydromorphone   amongst others.  Long term and even short term use of opiods can cause: Increased pain response Dependence Constipation Depression Respiratory depression And more.  Withdrawal symptoms can include Flu like symptoms Nausea, vomiting And more Techniques to manage these symptoms Hydrate well Eat regular healthy meals Stay active Use relaxation techniques(deep breathing, meditating, yoga) Do Not substitute Alcohol to help with tapering If you have been on opioids for less than two weeks and do not have pain than it is ok to stop all together.  Plan to wean off of opioids This plan should start within one week post op of your joint replacement. Maintain the same interval or time between taking each dose and first decrease the dose.  Cut the total daily intake of opioids by one tablet each day Next start to increase the time between doses. The last dose that should be eliminated is the evening dose.      TED hose   Complete by: As directed    Use stockings (TED hose) for 2 weeks on both leg(s).  You may remove them at night for sleeping.        Follow-up Information     Patti Rosina SAUNDERS, PA-C. Go on 05/05/2024.   Specialty: Orthopedic Surgery Why: You are scheduled for a post op appointment on Wednesday 05/05/24 at 2:45pm Contact information: [address] [place] [address] [phone]                  Signed: Rosina SAUNDERS Patti 04/27/2024, 9:26 AM

## 2024-05-25 ENCOUNTER — Other Ambulatory Visit: Payer: Self-pay | Admitting: Family Medicine

## 2024-06-16 DIAGNOSIS — M1611 Unilateral primary osteoarthritis, right hip: Secondary | ICD-10-CM | POA: Diagnosis not present

## 2024-06-16 DIAGNOSIS — Z5189 Encounter for other specified aftercare: Secondary | ICD-10-CM | POA: Diagnosis not present

## 2024-08-10 ENCOUNTER — Ambulatory Visit: Admitting: Family Medicine

## 2024-08-10 ENCOUNTER — Telehealth: Payer: Self-pay

## 2024-08-10 ENCOUNTER — Encounter: Payer: Self-pay | Admitting: Family Medicine

## 2024-08-10 VITALS — BP 160/76 | HR 76 | Temp 98.3°F | Wt 189.4 lb

## 2024-08-10 DIAGNOSIS — E1165 Type 2 diabetes mellitus with hyperglycemia: Secondary | ICD-10-CM

## 2024-08-10 DIAGNOSIS — I10 Essential (primary) hypertension: Secondary | ICD-10-CM

## 2024-08-10 DIAGNOSIS — Z01818 Encounter for other preprocedural examination: Secondary | ICD-10-CM

## 2024-08-10 LAB — POCT GLYCOSYLATED HEMOGLOBIN (HGB A1C): Hemoglobin A1C: 5.9 % — AB (ref 4.0–5.6)

## 2024-08-10 NOTE — Progress Notes (Signed)
 Established Patient Office Visit  Subjective   Patient ID: Joanne Mendoza, female    DOB: 17-Feb-1946  Age: 78 y.o. MRN: [id number]  Chief Complaint  Patient presents with   Pre-op Exam    HPI   Joanne Mendoza is seen for preoperative assessment for right total hip replacement.  She had left hip done last summer and that went very well.  She does have type 2 diabetes but has been well-controlled on metformin .  No recent A1c.  She has hypertension with suspected whitecoat syndrome.  She takes amlodipine  and losartan  HCTZ.  Home blood pressures fairly consistently well-controlled.  She takes atorvastatin  for hyperlipidemia  She does not have any history of TIA/cerebrovascular disease, cardiac ischemia, congestive heart failure, insulin  therapy, or significant chronic kidney disease.  Last creatinine 0.44.  Non-smoker.  No chronic lung issues.  Past Medical History:  Diagnosis Date   Arthritis    Diabetes mellitus without complication (HCC)    Hyperlipidemia    Hypertension    Past Surgical History:  Procedure Laterality Date   broken arm     CESAREAN SECTION     x 2    TOTAL HIP ARTHROPLASTY Left 04/20/2024   Procedure: ARTHROPLASTY, HIP, TOTAL, ANTERIOR APPROACH;  Surgeon: Ernie Cough, MD;  Location: [facility];  Service: Orthopedics;  Laterality: Left;    reports that she has never smoked. She has never used smokeless tobacco. She reports that she does not drink alcohol and does not use drugs. family history includes Alcohol abuse in her father; Colon cancer in her paternal uncle; Hyperlipidemia in an other family member; Hypertension in her mother; Pancreatic cancer in her paternal uncle. Allergies  Allergen Reactions   Darvon-N [Propoxyphene]     Made her pain worse   Penicillins Hives    Review of Systems  Constitutional:  Negative for malaise/fatigue.  Eyes:  Negative for blurred vision.  Respiratory:  Negative for shortness of breath.   Cardiovascular:  Negative for chest  pain.  Neurological:  Negative for dizziness, weakness and headaches.      Objective:     BP (!) 160/76   Pulse 76   Temp 98.3 F (36.8 C) (Oral)   Wt 189 lb 6.4 oz (85.9 kg)   SpO2 97%   BMI 33.02 kg/m  BP Readings from Last 3 Encounters:  08/10/24 (!) 160/76  04/21/24 112/67  04/09/24 (!) 146/77   Wt Readings from Last 3 Encounters:  08/10/24 189 lb 6.4 oz (85.9 kg)  04/20/24 196 lb (88.9 kg)  04/09/24 196 lb (88.9 kg)      Physical Exam Vitals reviewed.  Constitutional:      General: She is not in acute distress.    Appearance: She is well-developed. She is not ill-appearing.  Eyes:     Pupils: Pupils are equal, round, and reactive to light.  Neck:     Thyroid: No thyromegaly.     Vascular: No JVD.  Cardiovascular:     Rate and Rhythm: Normal rate and regular rhythm.     Heart sounds:     No gallop.  Pulmonary:     Effort: Pulmonary effort is normal. No respiratory distress.     Breath sounds: Normal breath sounds. No wheezing or rales.  Musculoskeletal:     Cervical back: Neck supple.  Neurological:     Mental Status: She is alert.      Results for orders placed or performed in visit on 08/10/24  POC HgB A1c  Result  Value Ref Range   Hemoglobin A1C 5.9 (A) 4.0 - 5.6 %   HbA1c POC (<> result, manual entry)     HbA1c, POC (prediabetic range)     HbA1c, POC (controlled diabetic range)        The 10-year ASCVD risk score (Arnett DK, et al., 2019) is: 54.7%    Assessment & Plan:   #1 preoperative clearance for right total hip replacement.  She had left hip done last summer and did well with that.  Her revised cardiac risk index score would be 0.  She has diabetes which is well-controlled with A1c today 5.9%.  EKG last July no acute abnormalities.  No known medical contraindications for surgery.  #2 type 2 diabetes well-controlled with A1c 5.9% on metformin .  Continue current therapy and reassess within 6 months  #3 hypertension.  Well-controlled  by home readings.  Suspected whitecoat syndrome.  Blood pressures have consistently over the years been elevated here.  Continue current regimen and be in touch if home blood pressures consistently over 140 systolic   Return in about 6 months (around 02/08/2025).    Wolm Scarlet, MD

## 2024-08-10 NOTE — Telephone Encounter (Signed)
 Copied from CRM [id number]. Topic: General - Other >> Aug 10, 2024  3:04 PM Victoria A wrote: Reason for CRM: Patient called to speak with Mykell when available -please call [phone] (M)

## 2024-08-10 NOTE — Patient Instructions (Signed)
 A1C is 5.9% which is excellent.    Hope your surgery goes well!

## 2024-08-11 NOTE — Telephone Encounter (Signed)
 This has been faxed.

## 2024-08-24 ENCOUNTER — Other Ambulatory Visit (HOSPITAL_COMMUNITY): Payer: Self-pay

## 2024-08-25 ENCOUNTER — Other Ambulatory Visit: Payer: Self-pay | Admitting: Family Medicine

## 2024-09-09 NOTE — Progress Notes (Addendum)
 COVID Vaccine received:  []  No [x]  Yes Date of any COVID positive Test in last 90 days: none PCP - Wolm Scarlet MD Cardiologist -   Chest x-ray -  EKG -  03/10/24 Epic Stress Test -  ECHO -  Cardiac Cath -   Medical clearance-08/10/24- Dr. Scarlet.  Bowel Prep - [x]  No  []   Yes ______  Pacemaker / ICD device [x]  No []  Yes   Spinal Cord Stimulator:[x]  No []  Yes       History of Sleep Apnea? [x]  No []  Yes   CPAP used?- [x]  No []  Yes    Does the patient monitor blood sugar?          [x]  No []  Yes  []  N/A  Patient has: []  NO Hx DM   []  Pre-DM                 []  DM1  [x]   DM2 Does patient have a [employer] or Dexacom? [x]  No []  Yes   Fasting Blood Sugar Ranges-  Checks Blood Sugar ___0__ times a day  GLP1 agonist / usual dose - no GLP1 instructions:  SGLT-2 inhibitors / usual dose - no SGLT-2 instructions:   Blood Thinner / Instructions:no Aspirin  Instructions:no  Comments:   Activity level: Patient is able  to climb a flight of stairs without difficulty; [x]  No CP  [x]  No SOB,   Patient can perform ADLs without assistance.   Anesthesia review:   Patient denies shortness of breath, fever, cough and chest pain at PAT appointment.  Patient verbalized understanding and agreement to the Pre-Surgical Instructions that were given to them at this PAT appointment. Patient was also educated of the need to review these PAT instructions again prior to his/her surgery.I reviewed the appropriate phone numbers to call if they have any and questions or concerns.

## 2024-09-09 NOTE — Patient Instructions (Addendum)
 SURGICAL WAITING ROOM VISITATION  Patients having surgery or a procedure may have no more than 2 support people in the waiting area - these visitors may rotate.    Children ages 61 and under will not be able to visit patients in [facility] under most circumstances.   Visitors with respiratory illnesses are discouraged from visiting and should remain at home.  If the patient needs to stay at the hospital during part of their recovery, the visitor guidelines for inpatient rooms apply. Pre-op nurse will coordinate an appropriate time for 1 support person to accompany patient in pre-op.  This support person may not rotate.    Please refer to the [facility] ([place]) website for the visitor guidelines for Inpatients (after your surgery is over and you are in a regular room).       Your procedure is scheduled on: 09/16/24   Report to [facility] Main Entrance    Report to admitting at 9:45 AM   Call this number if you have problems the morning of surgery [phone]   Do not eat food :After Midnight.   After Midnight you may have the following liquids until 9:15 AM DAY OF SURGERY  Water  Non-Citrus Juices (without pulp, NO RED-Apple, White grape, White cranberry) Black Coffee (NO MILK/CREAM OR CREAMERS, sugar ok)  Clear Tea (NO MILK/CREAM OR CREAMERS, sugar ok) regular and decaf                             Plain Jell-O (NO RED)                                           Fruit ices (not with fruit pulp, NO RED)                                     Popsicles (NO RED)                                                               Sports drinks like Gatorade (NO RED)                  The day of surgery:  Drink ONE (1) Pre-Surgery G2 at 9:15 AM the morning of surgery. Drink in one sitting. Do not sip.  This drink was given to you during your hospital  pre-op appointment visit. Nothing else to drink after completing the  Pre-Surgery G2.     Oral Hygiene is also important to  reduce your risk of infection.                                    Remember - BRUSH YOUR TEETH THE MORNING OF SURGERY WITH YOUR REGULAR TOOTHPASTE     Stop all vitamins and herbal supplements 7 days before surgery.   Take these medicines the morning of surgery with A SIP OF WATER : Amlodipine , atorvastatin , metoprolol , tylenol  if needed.  Do not take Hyzaar(losartan /hydrochlorothiazide ) the morning of surgery.   DO NOT TAKE ANY ORAL DIABETIC MEDICATIONS DAY OF YOUR SURGERY Hold Metformin  the morning of surgery.            You may not have any metal on your body including hair pins, jewelry, and body piercing             Do not wear make-up, lotions, powders, perfumes/cologne, or deodorant  Do not wear nail polish including gel and S&S, artificial/acrylic nails, or any other type of covering on natural nails including finger and toenails. If you have artificial nails, gel coating, etc. that needs to be removed by a nail salon please have this removed prior to surgery or surgery may need to be canceled/ delayed if the surgeon/ anesthesia feels like they are unable to be safely monitored.   Do not shave  48 hours prior to surgery.            Do not bring valuables to the hospital.  IS NOT             RESPONSIBLE   FOR VALUABLES.   Contacts, glasses, dentures or bridgework may not be worn into surgery.   Bring small overnight bag day of surgery.   DO NOT BRING YOUR HOME MEDICATIONS TO THE HOSPITAL. PHARMACY WILL DISPENSE MEDICATIONS LISTED ON YOUR MEDICATION LIST TO YOU DURING YOUR ADMISSION IN THE HOSPITAL!    Patients discharged on the day of surgery will not be allowed to drive home.  Someone NEEDS to stay with you for the first 24 hours after anesthesia.   Special Instructions: Bring a copy of your healthcare power of attorney and living will documents the day of surgery if you haven't scanned them before.              Please read over the following fact  sheets you were given: IF YOU HAVE QUESTIONS ABOUT YOUR PRE-OP INSTRUCTIONS PLEASE CALL [phone] Eliese   If you received a COVID test during your pre-op visit  it is requested that you wear a mask when out in public, stay away from anyone that may not be feeling well and notify your surgeon if you develop symptoms. If you test positive for Covid or have been in contact with anyone that has tested positive in the last 10 days please notify you surgeon.      Pre-operative 4 CHG Bath Instructions  DYNA-Hex 4 Chlorhexidine  Gluconate 4% Solution Antiseptic 4 fl. oz   You can play a key role in reducing the risk of infection after surgery. Your skin needs to be as free of germs as possible. You can reduce the number of germs on your skin by washing with CHG (chlorhexidine  gluconate) soap before surgery. CHG is an antiseptic soap that kills germs and continues to kill germs even after washing.   DO NOT use if you have an allergy to chlorhexidine /CHG or antibacterial soaps. If your skin becomes reddened or irritated, stop using the CHG and notify one of our RNs at   Please shower with the CHG soap starting 4 days before surgery using the following schedule:     Please keep in mind the following:  DO NOT shave, including legs and underarms, starting the day of your first shower.   You may shave your face at any point before/day of surgery.  Place clean sheets on your bed the day you start using CHG soap. Use a clean washcloth (not used since  being washed) for each shower. DO NOT sleep with pets once you start using the CHG.  CHG Shower Instructions:  If you choose to wash your hair and private area, wash first with your normal shampoo/soap.  After you use shampoo/soap, rinse your hair and body thoroughly to remove shampoo/soap residue.  Turn the water  OFF and apply about 3 tablespoons (45 ml) of CHG soap to a CLEAN washcloth.  Apply CHG soap ONLY FROM YOUR NECK DOWN TO YOUR TOES (washing for  3-5 minutes)  DO NOT use CHG soap on face, private areas, open wounds, or sores.  Pay special attention to the area where your surgery is being performed.  If you are having back surgery, having someone wash your back for you may be helpful. Wait 2 minutes after CHG soap is applied, then you may rinse off the CHG soap.  Pat dry with a clean towel  Put on clean clothes/pajamas   If you choose to wear lotion, please use ONLY the CHG-compatible lotions on the back of this paper.     Additional instructions for the day of surgery: DO NOT APPLY any lotions, deodorants, cologne, or perfumes.   Put on clean/comfortable clothes.  Brush your teeth.  Ask your nurse before applying any prescription medications to the skin.   CHG Compatible Lotions   Aveeno Moisturizing lotion  Cetaphil Moisturizing Cream  Cetaphil Moisturizing Lotion  Clairol Herbal Essence Moisturizing Lotion, Dry Skin  Clairol Herbal Essence Moisturizing Lotion, Extra Dry Skin  Clairol Herbal Essence Moisturizing Lotion, Normal Skin  Curel Age Defying Therapeutic Moisturizing Lotion with Alpha Hydroxy  Curel Extreme Care Body Lotion  Curel Soothing Hands Moisturizing Hand Lotion  Curel Therapeutic Moisturizing Cream, Fragrance-Free  Curel Therapeutic Moisturizing Lotion, Fragrance-Free  Curel Therapeutic Moisturizing Lotion, Original Formula  Eucerin Daily Replenishing Lotion  Eucerin Dry Skin Therapy Plus Alpha Hydroxy Crme  Eucerin Dry Skin Therapy Plus Alpha Hydroxy Lotion  Eucerin Original Crme  Eucerin Original Lotion  Eucerin Plus Crme Eucerin Plus Lotion  Eucerin TriLipid Replenishing Lotion  Keri Anti-Bacterial Hand Lotion  Keri Deep Conditioning Original Lotion Dry Skin Formula Softly Scented  Keri Deep Conditioning Original Lotion, Fragrance Free Sensitive Skin Formula  Keri Lotion Fast Absorbing Fragrance Free Sensitive Skin Formula  Keri Lotion Fast Absorbing Softly Scented Dry Skin Formula  Keri  Original Lotion  Keri Skin Renewal Lotion Keri Silky Smooth Lotion  Keri Silky Smooth Sensitive Skin Lotion  Nivea Body Creamy Conditioning Oil  Nivea Body Extra Enriched Lotion  Nivea Body Original Lotion  Nivea Body Sheer Moisturizing Lotion Nivea Crme  Nivea Skin Firming Lotion  NutraDerm 30 Skin Lotion  NutraDerm Skin Lotion  NutraDerm Therapeutic Skin Cream  NutraDerm Therapeutic Skin Lotion  ProShield Protective Hand Cream Incentive Spirometer (Watch this video at home: [email or web address])  An incentive spirometer is a tool that can help keep your lungs clear and active. This tool measures how well you are filling your lungs with each breath. Taking long deep breaths may help reverse or decrease the chance of developing breathing (pulmonary) problems (especially infection) following: A long period of time when you are unable to move or be active. BEFORE THE PROCEDURE  If the spirometer includes an indicator to show your best effort, your nurse or respiratory therapist will set it to a desired goal. If possible, sit up straight or lean slightly forward. Try not to slouch. Hold the incentive spirometer in an upright position. INSTRUCTIONS FOR USE  Sit on the edge of your  bed if possible, or sit up as far as you can in bed or on a chair. Hold the incentive spirometer in an upright position. Breathe out normally. Place the mouthpiece in your mouth and seal your lips tightly around it. Breathe in slowly and as deeply as possible, raising the piston or the ball toward the top of the column. Hold your breath for 3-5 seconds or for as long as possible. Allow the piston or ball to fall to the bottom of the column. Remove the mouthpiece from your mouth and breathe out normally. Rest for a few seconds and repeat Steps 1 through 7 at least 10 times every 1-2 hours when you are awake. Take your time and take a few normal breaths between deep breaths. The spirometer  may include an indicator to show your best effort. Use the indicator as a goal to work toward during each repetition. After each set of 10 deep breaths, practice coughing to be sure your lungs are clear. If you have an incision (the cut made at the time of surgery), support your incision when coughing by placing a pillow or rolled up towels firmly against it. Once you are able to get out of bed, walk around indoors and cough well. You may stop using the incentive spirometer when instructed by your caregiver.  RISKS AND COMPLICATIONS Take your time so you do not get dizzy or light-headed. If you are in pain, you may need to take or ask for pain medication before doing incentive spirometry. It is harder to take a deep breath if you are having pain. AFTER USE Rest and breathe slowly and easily. It can be helpful to keep track of a log of your progress. Your caregiver can provide you with a simple table to help with this. If you are using the spirometer at home, follow these instructions: SEEK MEDICAL CARE IF:  You are having difficultly using the spirometer. You have trouble using the spirometer as often as instructed. Your pain medication is not giving enough relief while using the spirometer. You develop fever of 100.5 F (38.1 C) or higher. SEEK IMMEDIATE MEDICAL CARE IF:  You cough up bloody sputum that had not been present before. You develop fever of 102 F (38.9 C) or greater. You develop worsening pain at or near the incision site. MAKE SURE YOU:  Understand these instructions. Will watch your condition. Will get help right away if you are not doing well or get worse. Document Released: 12/30/2006 Document Revised: 11/11/2011 Document Reviewed: 03/02/2007  WHAT IS A BLOOD TRANSFUSION? Blood Transfusion Information  A transfusion is the replacement of blood or some of its parts. Blood is made up of multiple cells which provide different functions. Red blood cells carry oxygen and  are used for blood loss replacement. White blood cells fight against infection. Platelets control bleeding. Plasma helps clot blood. Other blood products are available for specialized needs, such as hemophilia or other clotting disorders. BEFORE THE TRANSFUSION  Who gives blood for transfusions?  Healthy volunteers who are fully evaluated to make sure their blood is safe. This is blood bank blood. Transfusion therapy is the safest it has ever been in the practice of medicine. Before blood is taken from a donor, a complete history is taken to make sure that person has no history of diseases nor engages in risky social behavior (examples are intravenous drug use or sexual activity with multiple partners). The donor's travel history is screened to minimize risk of transmitting infections,  such as malaria. The donated blood is tested for signs of infectious diseases, such as HIV and hepatitis. The blood is then tested to be sure it is compatible with you in order to minimize the chance of a transfusion reaction. If you or a relative donates blood, this is often done in anticipation of surgery and is not appropriate for emergency situations. It takes many days to process the donated blood. RISKS AND COMPLICATIONS Although transfusion therapy is very safe and saves many lives, the main dangers of transfusion include:  Getting an infectious disease. Developing a transfusion reaction. This is an allergic reaction to something in the blood you were given. Every precaution is taken to prevent this. The decision to have a blood transfusion has been considered carefully by your caregiver before blood is given. Blood is not given unless the benefits outweigh the risks. AFTER THE TRANSFUSION Right after receiving a blood transfusion, you will usually feel much better and more energetic. This is especially true if your red blood cells have gotten low (anemic). The transfusion raises the level of the red blood cells  which carry oxygen, and this usually causes an energy increase. The nurse administering the transfusion will monitor you carefully for complications. HOME CARE INSTRUCTIONS  No special instructions are needed after a transfusion. You may find your energy is better. Speak with your caregiver about any limitations on activity for underlying diseases you may have. SEEK MEDICAL CARE IF:  Your condition is not improving after your transfusion. You develop redness or irritation at the intravenous (IV) site. SEEK IMMEDIATE MEDICAL CARE IF:  Any of the following symptoms occur over the next 12 hours: Shaking chills. You have a temperature by mouth above 102 F (38.9 C), not controlled by medicine. Chest, back, or muscle pain. People around you feel you are not acting correctly or are confused. Shortness of breath or difficulty breathing. Dizziness and fainting. You get a rash or develop hives. You have a decrease in urine output. Your urine turns a dark color or changes to pink, red, or brown. Any of the following symptoms occur over the next 10 days: You have a temperature by mouth above 102 F (38.9 C), not controlled by medicine. Shortness of breath. Weakness after normal activity. The white part of the eye turns yellow (jaundice). You have a decrease in the amount of urine or are urinating less often. Your urine turns a dark color or changes to pink, red, or brown. Document Released: 08/16/2000 Document Revised: 11/11/2011 Document Reviewed: 04/04/2008 How to Manage Your Diabetes Before and After Surgery  Why is it important to control my blood sugar before and after surgery? Improving blood sugar levels before and after surgery helps healing and can limit problems. A way of improving blood sugar control is eating a healthy diet by:  Eating less sugar and carbohydrates  Increasing activity/exercise  Talking with your doctor about reaching your blood sugar goals High blood sugars  (greater than 180 mg/dL) can raise your risk of infections and slow your recovery, so you will need to focus on controlling your diabetes during the weeks before surgery. Make sure that the doctor who takes care of your diabetes knows about your planned surgery including the date and location.  How do I manage my blood sugar before surgery? Check your blood sugar at least 4 times a day, starting 2 days before surgery, to make sure that the level is not too high or low. Check your blood sugar the morning  of your surgery when you wake up and every 2 hours until you get to the Short Stay unit. If your blood sugar is less than 70 mg/dL, you will need to treat for low blood sugar: Do not take insulin . Treat a low blood sugar (less than 70 mg/dL) with  cup of clear juice (cranberry or apple), 4 glucose tablets, OR glucose gel. Recheck blood sugar in 15 minutes after treatment (to make sure it is greater than 70 mg/dL). If your blood sugar is not greater than 70 mg/dL on recheck, call [phone] for further instructions. Report your blood sugar to the short stay nurse when you get to Short Stay.  If you are admitted to the hospital after surgery: Your blood sugar will be checked by the staff and you will probably be given insulin  after surgery (instead of oral diabetes medicines) to make sure you have good blood sugar levels. The goal for blood sugar control after surgery is 80-180 mg/dL.   WHAT DO I DO ABOUT MY DIABETES MEDICATION?  Do not take oral diabetes medicines (pills) the morning of surgery.   Patient Signature:  Date:   Nurse Signature:  Date:   Reviewed and Endorsed by [facility] Patient Education Committee, August 2015

## 2024-09-10 ENCOUNTER — Other Ambulatory Visit: Payer: Self-pay

## 2024-09-10 ENCOUNTER — Encounter (HOSPITAL_COMMUNITY): Payer: Self-pay

## 2024-09-10 ENCOUNTER — Encounter (HOSPITAL_COMMUNITY)
Admission: RE | Admit: 2024-09-10 | Discharge: 2024-09-10 | Disposition: A | Source: Ambulatory Visit | Attending: Orthopedic Surgery

## 2024-09-10 VITALS — BP 145/74 | HR 71 | Temp 98.4°F | Resp 18 | Ht 64.5 in | Wt 183.0 lb

## 2024-09-10 DIAGNOSIS — M1611 Unilateral primary osteoarthritis, right hip: Secondary | ICD-10-CM | POA: Insufficient documentation

## 2024-09-10 DIAGNOSIS — Z01818 Encounter for other preprocedural examination: Secondary | ICD-10-CM | POA: Diagnosis present

## 2024-09-10 DIAGNOSIS — Z01812 Encounter for preprocedural laboratory examination: Secondary | ICD-10-CM | POA: Diagnosis not present

## 2024-09-10 DIAGNOSIS — E1165 Type 2 diabetes mellitus with hyperglycemia: Secondary | ICD-10-CM | POA: Diagnosis not present

## 2024-09-10 DIAGNOSIS — I10 Essential (primary) hypertension: Secondary | ICD-10-CM | POA: Diagnosis not present

## 2024-09-10 LAB — BASIC METABOLIC PANEL WITH GFR
Anion gap: 10 (ref 5–15)
BUN: 14 mg/dL (ref 8–23)
CO2: 26 mmol/L (ref 22–32)
Calcium: 10.2 mg/dL (ref 8.9–10.3)
Chloride: 103 mmol/L (ref 98–111)
Creatinine, Ser: 0.72 mg/dL (ref 0.44–1.00)
GFR, Estimated: >60 mL/min
Glucose, Bld: 125 mg/dL — ABNORMAL HIGH (ref 70–99)
Potassium: 4.9 mmol/L (ref 3.5–5.1)
Sodium: 140 mmol/L (ref 135–145)

## 2024-09-10 LAB — CBC
HCT: 37.6 % (ref 36.0–46.0)
Hemoglobin: 12.3 g/dL (ref 12.0–15.0)
MCH: 30.6 pg (ref 26.0–34.0)
MCHC: 32.7 g/dL (ref 30.0–36.0)
MCV: 93.5 fL (ref 80.0–100.0)
Platelets: 295 K/uL (ref 150–400)
RBC: 4.02 MIL/uL (ref 3.87–5.11)
RDW: 14.1 % (ref 11.5–15.5)
WBC: 6.2 K/uL (ref 4.0–10.5)
nRBC: 0 % (ref 0.0–0.2)

## 2024-09-10 LAB — GLUCOSE, CAPILLARY: Glucose-Capillary: 124 mg/dL — ABNORMAL HIGH (ref 70–99)

## 2024-09-10 LAB — SURGICAL PCR SCREEN
MRSA, PCR: NEGATIVE
Staphylococcus aureus: NEGATIVE

## 2024-09-16 ENCOUNTER — Other Ambulatory Visit: Payer: Self-pay

## 2024-09-16 ENCOUNTER — Ambulatory Visit (HOSPITAL_COMMUNITY): Admitting: Anesthesiology

## 2024-09-16 ENCOUNTER — Observation Stay (HOSPITAL_COMMUNITY)

## 2024-09-16 ENCOUNTER — Encounter (HOSPITAL_COMMUNITY): Payer: Self-pay | Admitting: Orthopedic Surgery

## 2024-09-16 ENCOUNTER — Observation Stay (HOSPITAL_COMMUNITY)
Admission: RE | Admit: 2024-09-16 | Discharge: 2024-09-17 | Disposition: A | Discharge status: Home / Self Care | Attending: Orthopedic Surgery | Admitting: Orthopedic Surgery

## 2024-09-16 ENCOUNTER — Ambulatory Visit (HOSPITAL_COMMUNITY)

## 2024-09-16 ENCOUNTER — Encounter (HOSPITAL_COMMUNITY): Admission: RE | Disposition: A | Payer: Self-pay | Source: Home / Self Care | Attending: Orthopedic Surgery

## 2024-09-16 DIAGNOSIS — Z7984 Long term (current) use of oral hypoglycemic drugs: Secondary | ICD-10-CM | POA: Diagnosis not present

## 2024-09-16 DIAGNOSIS — Z7982 Long term (current) use of aspirin: Secondary | ICD-10-CM | POA: Insufficient documentation

## 2024-09-16 DIAGNOSIS — Z96642 Presence of left artificial hip joint: Secondary | ICD-10-CM | POA: Insufficient documentation

## 2024-09-16 DIAGNOSIS — I10 Essential (primary) hypertension: Secondary | ICD-10-CM | POA: Insufficient documentation

## 2024-09-16 DIAGNOSIS — Z96641 Presence of right artificial hip joint: Secondary | ICD-10-CM

## 2024-09-16 DIAGNOSIS — M1611 Unilateral primary osteoarthritis, right hip: Principal | ICD-10-CM | POA: Insufficient documentation

## 2024-09-16 DIAGNOSIS — Z79899 Other long term (current) drug therapy: Secondary | ICD-10-CM | POA: Diagnosis not present

## 2024-09-16 DIAGNOSIS — E119 Type 2 diabetes mellitus without complications: Secondary | ICD-10-CM | POA: Diagnosis not present

## 2024-09-16 HISTORY — PX: TOTAL HIP ARTHROPLASTY: SHX124

## 2024-09-16 LAB — TYPE AND SCREEN
ABO/RH(D): B POS
Antibody Screen: NEGATIVE

## 2024-09-16 LAB — GLUCOSE, CAPILLARY
Glucose-Capillary: 137 mg/dL — ABNORMAL HIGH (ref 70–99)
Glucose-Capillary: 134 mg/dL — ABNORMAL HIGH (ref 70–99)
Glucose-Capillary: 132 mg/dL — ABNORMAL HIGH (ref 70–99)
Glucose-Capillary: 163 mg/dL — ABNORMAL HIGH (ref 70–99)
Glucose-Capillary: 262 mg/dL — ABNORMAL HIGH (ref 70–99)

## 2024-09-16 MED ORDER — BUPIVACAINE IN DEXTROSE 0.75-8.25 % IT SOLN
INTRATHECAL | Status: DC | PRN
  Administered 2024-09-16: 1.8 mL via INTRATHECAL

## 2024-09-16 MED ORDER — DEXAMETHASONE SOD PHOSPHATE PF 10 MG/ML IJ SOLN
8.0000 mg | Freq: Once | INTRAMUSCULAR | Status: AC
  Administered 2024-09-16: 8 mg via INTRAVENOUS

## 2024-09-16 MED ORDER — AMLODIPINE BESYLATE 5 MG PO TABS
5.0000 mg | ORAL_TABLET | Freq: Every day | ORAL | Status: DC
  Administered 2024-09-17: 5 mg via ORAL
  Filled 2024-09-16: qty 1

## 2024-09-16 MED ORDER — PROPOFOL 500 MG/50ML IV EMUL
INTRAVENOUS | Status: DC | PRN
  Administered 2024-09-16: 35 ug/kg/min via INTRAVENOUS

## 2024-09-16 MED ORDER — SODIUM CHLORIDE 0.9 % IV SOLN: INTRAVENOUS | Status: DC

## 2024-09-16 MED ORDER — DEXAMETHASONE SOD PHOSPHATE PF 10 MG/ML IJ SOLN
INTRAMUSCULAR | Status: AC
  Filled 2024-09-16: qty 1

## 2024-09-16 MED ORDER — HYDROMORPHONE HCL 1 MG/ML IJ SOLN
0.2500 mg | INTRAMUSCULAR | Status: DC | PRN
  Administered 2024-09-16: 0.25 mg via INTRAVENOUS

## 2024-09-16 MED ORDER — TRANEXAMIC ACID-NACL 1000-0.7 MG/100ML-% IV SOLN
1000.0000 mg | Freq: Once | INTRAVENOUS | Status: AC
  Administered 2024-09-16: 1000 mg via INTRAVENOUS
  Filled 2024-09-16: qty 100

## 2024-09-16 MED ORDER — LACTATED RINGERS IV SOLN: INTRAVENOUS | Status: DC

## 2024-09-16 MED ORDER — DROPERIDOL 2.5 MG/ML IJ SOLN: 0.6250 mg | Freq: Once | INTRAMUSCULAR | Status: DC | PRN

## 2024-09-16 MED ORDER — STERILE WATER FOR IRRIGATION IR SOLN
Status: DC | PRN
  Administered 2024-09-16: 2000 mL

## 2024-09-16 MED ORDER — ACETAMINOPHEN 500 MG PO TABS
1000.0000 mg | ORAL_TABLET | Freq: Four times a day (QID) | ORAL | Status: DC
  Administered 2024-09-16 – 2024-09-17 (×3): 1000 mg via ORAL
  Filled 2024-09-16 (×3): qty 2

## 2024-09-16 MED ORDER — FENTANYL CITRATE (PF) 100 MCG/2ML IJ SOLN
INTRAMUSCULAR | Status: AC
  Filled 2024-09-16: qty 2

## 2024-09-16 MED ORDER — ONDANSETRON HCL 4 MG/2ML IJ SOLN
INTRAMUSCULAR | Status: DC | PRN
  Administered 2024-09-16: 4 mg via INTRAVENOUS

## 2024-09-16 MED ORDER — POLYETHYLENE GLYCOL 3350 17 G PO PACK
17.0000 g | PACK | Freq: Two times a day (BID) | ORAL | Status: DC
  Administered 2024-09-16 – 2024-09-17 (×2): 17 g via ORAL
  Filled 2024-09-16 (×2): qty 1

## 2024-09-16 MED ORDER — HYDROMORPHONE HCL 1 MG/ML IJ SOLN: 0.5000 mg | INTRAMUSCULAR | Status: DC | PRN

## 2024-09-16 MED ORDER — PHENOL 1.4 % MT LIQD: 1.0000 | OROMUCOSAL | Status: DC | PRN

## 2024-09-16 MED ORDER — ACETAMINOPHEN 10 MG/ML IV SOLN: 1000.0000 mg | Freq: Once | INTRAVENOUS | Status: DC | PRN

## 2024-09-16 MED ORDER — 0.9 % SODIUM CHLORIDE (POUR BTL) OPTIME
TOPICAL | Status: DC | PRN
  Administered 2024-09-16: 1000 mL

## 2024-09-16 MED ORDER — BISACODYL 10 MG RE SUPP: 10.0000 mg | Freq: Every day | RECTAL | Status: DC | PRN

## 2024-09-16 MED ORDER — ATORVASTATIN CALCIUM 40 MG PO TABS
40.0000 mg | ORAL_TABLET | Freq: Every day | ORAL | Status: DC
  Administered 2024-09-16: 40 mg via ORAL
  Filled 2024-09-16 (×2): qty 1

## 2024-09-16 MED ORDER — HYDROCHLOROTHIAZIDE 12.5 MG PO TABS
12.5000 mg | ORAL_TABLET | Freq: Every day | ORAL | Status: DC
  Administered 2024-09-17: 12.5 mg via ORAL
  Filled 2024-09-16: qty 1

## 2024-09-16 MED ORDER — CEFAZOLIN SODIUM-DEXTROSE 2-4 GM/100ML-% IV SOLN
2.0000 g | Freq: Four times a day (QID) | INTRAVENOUS | Status: AC
  Administered 2024-09-16 – 2024-09-17 (×2): 2 g via INTRAVENOUS
  Filled 2024-09-16 (×2): qty 100

## 2024-09-16 MED ORDER — OXYCODONE HCL 5 MG/5ML PO SOLN: 5.0000 mg | Freq: Once | ORAL | Status: DC | PRN

## 2024-09-16 MED ORDER — SODIUM CHLORIDE (PF) 0.9 % IJ SOLN
INTRAMUSCULAR | Status: DC | PRN
  Administered 2024-09-16: 61 mL

## 2024-09-16 MED ORDER — MEPERIDINE HCL 25 MG/ML IJ SOLN: 6.2500 mg | INTRAMUSCULAR | Status: DC | PRN

## 2024-09-16 MED ORDER — LOSARTAN POTASSIUM 50 MG PO TABS
100.0000 mg | ORAL_TABLET | Freq: Every day | ORAL | Status: DC
  Administered 2024-09-17: 100 mg via ORAL
  Filled 2024-09-16: qty 2

## 2024-09-16 MED ORDER — DEXAMETHASONE SOD PHOSPHATE PF 10 MG/ML IJ SOLN
10.0000 mg | Freq: Once | INTRAMUSCULAR | Status: AC
  Administered 2024-09-17: 10 mg via INTRAVENOUS
  Filled 2024-09-16: qty 1

## 2024-09-16 MED ORDER — SENNA 8.6 MG PO TABS
2.0000 | ORAL_TABLET | Freq: Every day | ORAL | Status: DC
  Administered 2024-09-16: 17.2 mg via ORAL
  Filled 2024-09-16: qty 2

## 2024-09-16 MED ORDER — TRANEXAMIC ACID-NACL 1000-0.7 MG/100ML-% IV SOLN
1000.0000 mg | INTRAVENOUS | Status: AC
  Administered 2024-09-16: 1000 mg via INTRAVENOUS
  Filled 2024-09-16: qty 100

## 2024-09-16 MED ORDER — PROPOFOL 10 MG/ML IV BOLUS
INTRAVENOUS | Status: DC | PRN
  Administered 2024-09-16: 20 mg via INTRAVENOUS
  Administered 2024-09-16: 35 mg via INTRAVENOUS

## 2024-09-16 MED ORDER — METOCLOPRAMIDE HCL 5 MG/ML IJ SOLN: 5.0000 mg | Freq: Three times a day (TID) | INTRAMUSCULAR | Status: DC | PRN

## 2024-09-16 MED ORDER — MIDAZOLAM HCL (PF) 2 MG/2ML IJ SOLN
INTRAMUSCULAR | Status: DC | PRN
  Administered 2024-09-16: .5 mg via INTRAVENOUS

## 2024-09-16 MED ORDER — METOPROLOL SUCCINATE ER 50 MG PO TB24
50.0000 mg | ORAL_TABLET | Freq: Every day | ORAL | Status: DC
  Administered 2024-09-17: 50 mg via ORAL
  Filled 2024-09-16: qty 1

## 2024-09-16 MED ORDER — HYDROMORPHONE HCL 1 MG/ML IJ SOLN
INTRAMUSCULAR | Status: AC
  Filled 2024-09-16: qty 1

## 2024-09-16 MED ORDER — PHENYLEPHRINE HCL-NACL 20-0.9 MG/250ML-% IV SOLN
INTRAVENOUS | Status: DC | PRN
  Administered 2024-09-16: 20 ug/min via INTRAVENOUS

## 2024-09-16 MED ORDER — POVIDONE-IODINE 10 % EX SWAB
2.0000 | Freq: Once | CUTANEOUS | Status: AC
  Administered 2024-09-16: 2 via TOPICAL

## 2024-09-16 MED ORDER — MEPIVACAINE HCL (PF) 2 % IJ SOLN
INTRAMUSCULAR | Status: AC
  Filled 2024-09-16: qty 60

## 2024-09-16 MED ORDER — DIPHENHYDRAMINE HCL 12.5 MG/5ML PO ELIX: 12.5000 mg | ORAL_SOLUTION | ORAL | Status: DC | PRN

## 2024-09-16 MED ORDER — LOSARTAN POTASSIUM-HCTZ 100-12.5 MG PO TABS: 1.0000 | ORAL_TABLET | Freq: Every day | ORAL | Status: DC

## 2024-09-16 MED ORDER — FENTANYL CITRATE (PF) 100 MCG/2ML IJ SOLN
INTRAMUSCULAR | Status: DC | PRN
  Administered 2024-09-16 (×4): 25 ug via INTRAVENOUS

## 2024-09-16 MED ORDER — ONDANSETRON HCL 4 MG/2ML IJ SOLN: 4.0000 mg | Freq: Four times a day (QID) | INTRAMUSCULAR | Status: DC | PRN

## 2024-09-16 MED ORDER — METFORMIN HCL 500 MG PO TABS
1000.0000 mg | ORAL_TABLET | Freq: Two times a day (BID) | ORAL | Status: DC
  Administered 2024-09-17: 1000 mg via ORAL
  Filled 2024-09-16: qty 2

## 2024-09-16 MED ORDER — OXYCODONE HCL 5 MG PO TABS: 5.0000 mg | ORAL_TABLET | Freq: Once | ORAL | Status: DC | PRN

## 2024-09-16 MED ORDER — ONDANSETRON HCL 4 MG/2ML IJ SOLN
INTRAMUSCULAR | Status: AC
  Filled 2024-09-16: qty 2

## 2024-09-16 MED ORDER — EPHEDRINE SULFATE (PRESSORS) 25 MG/5ML IV SOSY
PREFILLED_SYRINGE | INTRAVENOUS | Status: DC | PRN
  Administered 2024-09-16: 5 mg via INTRAVENOUS

## 2024-09-16 MED ORDER — ASPIRIN 81 MG PO CHEW
81.0000 mg | CHEWABLE_TABLET | Freq: Two times a day (BID) | ORAL | Status: DC
  Administered 2024-09-16 – 2024-09-17 (×2): 81 mg via ORAL
  Filled 2024-09-16 (×2): qty 1

## 2024-09-16 MED ORDER — INSULIN ASPART 100 UNIT/ML IJ SOLN: 0.0000 [IU] | INTRAMUSCULAR | Status: DC | PRN

## 2024-09-16 MED ORDER — EPHEDRINE 5 MG/ML INJ
INTRAVENOUS | Status: AC
  Filled 2024-09-16: qty 5

## 2024-09-16 MED ORDER — TRAMADOL HCL 50 MG PO TABS: 50.0000 mg | ORAL_TABLET | Freq: Four times a day (QID) | ORAL | Status: DC | PRN

## 2024-09-16 MED ORDER — MIDAZOLAM HCL 2 MG/2ML IJ SOLN
INTRAMUSCULAR | Status: AC
  Filled 2024-09-16: qty 2

## 2024-09-16 MED ORDER — BUPIVACAINE-EPINEPHRINE (PF) 0.25% -1:200000 IJ SOLN
INTRAMUSCULAR | Status: AC
  Filled 2024-09-16: qty 30

## 2024-09-16 MED ORDER — METHOCARBAMOL 500 MG PO TABS
500.0000 mg | ORAL_TABLET | Freq: Four times a day (QID) | ORAL | Status: DC | PRN
  Administered 2024-09-16: 500 mg via ORAL
  Filled 2024-09-16: qty 1

## 2024-09-16 MED ORDER — METHOCARBAMOL 1000 MG/10ML IJ SOLN: 500.0000 mg | Freq: Four times a day (QID) | INTRAMUSCULAR | Status: DC | PRN

## 2024-09-16 MED ORDER — CHLORHEXIDINE GLUCONATE 0.12 % MT SOLN
15.0000 mL | Freq: Once | OROMUCOSAL | Status: AC
  Administered 2024-09-16: 15 mL via OROMUCOSAL

## 2024-09-16 MED ORDER — METOCLOPRAMIDE HCL 5 MG PO TABS: 5.0000 mg | ORAL_TABLET | Freq: Three times a day (TID) | ORAL | Status: DC | PRN

## 2024-09-16 MED ORDER — MENTHOL 3 MG MT LOZG: 1.0000 | LOZENGE | OROMUCOSAL | Status: DC | PRN

## 2024-09-16 MED ORDER — SODIUM CHLORIDE (PF) 0.9 % IJ SOLN
INTRAMUSCULAR | Status: AC
  Filled 2024-09-16: qty 30

## 2024-09-16 MED ORDER — KETOROLAC TROMETHAMINE 30 MG/ML IJ SOLN
INTRAMUSCULAR | Status: AC
  Filled 2024-09-16: qty 1

## 2024-09-16 MED ORDER — ALUM & MAG HYDROXIDE-SIMETH 200-200-20 MG/5ML PO SUSP: 30.0000 mL | ORAL | Status: DC | PRN

## 2024-09-16 MED ORDER — CEFAZOLIN SODIUM-DEXTROSE 2-4 GM/100ML-% IV SOLN
2.0000 g | INTRAVENOUS | Status: AC
  Administered 2024-09-16: 2 g via INTRAVENOUS
  Filled 2024-09-16: qty 100

## 2024-09-16 MED ORDER — ONDANSETRON HCL 4 MG PO TABS: 4.0000 mg | ORAL_TABLET | Freq: Four times a day (QID) | ORAL | Status: DC | PRN

## 2024-09-16 MED ORDER — ORAL CARE MOUTH RINSE: 15.0000 mL | Freq: Once | OROMUCOSAL | Status: AC

## 2024-09-16 NOTE — Anesthesia Preprocedure Evaluation (Addendum)
 "                                  Anesthesia Evaluation  Patient identified by MRN, date of birth, ID band Patient awake    Reviewed: Allergy & Precautions, NPO status , Patient's Chart, lab work & pertinent test results  Airway Mallampati: I  TM Distance: >3 FB Neck ROM: Full    Dental  (+) Teeth Intact, Dental Advisory Given   Pulmonary neg pulmonary ROS   breath sounds clear to auscultation       Cardiovascular hypertension, Pt. on medications and Pt. on home beta blockers  Rhythm:Regular Rate:Normal     Neuro/Psych negative neurological ROS  negative psych ROS   GI/Hepatic negative GI ROS, Neg liver ROS,,,  Endo/Other  diabetes, Type 2, Oral Hypoglycemic Agents    Renal/GU negative Renal ROS     Musculoskeletal  (+) Arthritis ,    Abdominal   Peds  Hematology negative hematology ROS (+)   Anesthesia Other Findings   Reproductive/Obstetrics                              Anesthesia Physical Anesthesia Plan  ASA: 2  Anesthesia Plan: Spinal   Post-op Pain Management: Minimal or no pain anticipated   Induction: Intravenous  PONV Risk Score and Plan: 3 and Ondansetron , Dexamethasone  and Midazolam   Airway Management Planned: Natural Airway and Nasal Cannula  Additional Equipment: None  Intra-op Plan:   Post-operative Plan:   Informed Consent: I have reviewed the patients History and Physical, chart, labs and discussed the procedure including the risks, benefits and alternatives for the proposed anesthesia with the patient or authorized representative who has indicated his/her understanding and acceptance.       Plan Discussed with: CRNA  Anesthesia Plan Comments: (Lab Results      Component                Value               Date                      WBC                      6.2                 09/10/2024                HGB                      12.3                09/10/2024                HCT                       37.6                09/10/2024                MCV                      93.5                09/10/2024  PLT                      295                 09/10/2024           )         Anesthesia Quick Evaluation  "

## 2024-09-16 NOTE — Care Plan (Signed)
 Ortho Bundle Case Management Note  Patient Details  Name: Joanne Mendoza MRN: [id number] Date of Birth: 11-21-1945  R THA on 09/16/24  DCP: Home with sons  DME: No needs  PT: HEP                   DME Arranged:  N/A DME Agency:  NA  HH Arranged:    HH Agency:     Additional Comments: Please contact me with any questions of if this plan should need to change.  Lyle Pepper, CCM [facility] [phone]  Ext. [phone]   09/16/2024, 10:13 AM

## 2024-09-16 NOTE — Anesthesia Procedure Notes (Signed)
 Spinal  Start time: 09/16/2024 12:20 PM End time: 09/16/2024 12:23 PM Reason for block: surgical anesthesia  Staffing Performed: anesthesiologist  Authorized by: Tilford Franky BIRCH, MD   Performed by: Tilford Franky BIRCH, MD  Preanesthetic Checklist Completed: patient identified, IV checked, site marked, risks and benefits discussed, surgical consent, monitors and equipment checked, pre-op evaluation and timeout performed Spinal Block Patient position: sitting Prep: DuraPrep and site prepped and draped Location: L3-4 Injection technique: single-shot Needle Needle type: Pencan  Needle gauge: 24 G Needle length: 10 cm Needle insertion depth (cm): 10 Assessment Events: CSF return  Additional Notes Patient tolerated well. No immediate complications.  Functioning IV was confirmed and monitors were applied. Sterile prep and drape, including hand hygiene and sterile gloves were used. The patient was positioned and the back was prepped. The skin was anesthetized with lidocaine. Free flow of clear CSF was obtained prior to injecting local anesthetic into the CSF. The spinal needle aspirated freely following injection. The needle was carefully withdrawn. The patient tolerated the procedure well.

## 2024-09-16 NOTE — Anesthesia Postprocedure Evaluation (Signed)
"   Anesthesia Post Note  Patient: Keenya Matera Ky  Procedure(s) Performed: ARTHROPLASTY, HIP, TOTAL, ANTERIOR APPROACH (Right: Hip)     Patient location during evaluation: PACU Anesthesia Type: Spinal Level of consciousness: oriented and awake and alert Pain management: pain level controlled Vital Signs Assessment: post-procedure vital signs reviewed and stable Respiratory status: spontaneous breathing, respiratory function stable and patient connected to nasal cannula oxygen Cardiovascular status: blood pressure returned to baseline and stable Postop Assessment: no headache, no backache and no apparent nausea or vomiting Anesthetic complications: no   No notable events documented.  Last Vitals:  Vitals:   09/16/24 1615 09/16/24 1630  BP: (!) 116/55 (!) 116/57  Pulse: 61 (!) 59  Resp: 12 18  Temp:  36.7 C  SpO2: 95% 98%    Last Pain:  Vitals:   09/16/24 1630  TempSrc:   PainSc: 0-No pain                 Franky JONETTA Bald      "

## 2024-09-16 NOTE — Transfer of Care (Signed)
 Immediate Anesthesia Transfer of Care Note  Patient: Joanne Mendoza  Procedure(s) Performed: ARTHROPLASTY, HIP, TOTAL, ANTERIOR APPROACH (Right: Hip)  Patient Location: PACU  Anesthesia Type:Spinal  Level of Consciousness: awake, alert , oriented, and patient cooperative  Airway & Oxygen Therapy: Patient Spontanous Breathing  Post-op Assessment: Report given to RN and Post -op Vital signs reviewed and stable  Post vital signs: Reviewed and stable  Last Vitals:  Vitals Value Taken Time  BP 140/61 09/16/24 14:43  Temp    Pulse 65 09/16/24 14:42  Resp 16 09/16/24 14:42  SpO2 98 % on RA 09/16/24 14:42  Vitals shown include unfiled device data.  Last Pain:  Vitals:   09/16/24 1048  TempSrc:   PainSc: 0-No pain         Complications: No notable events documented.

## 2024-09-16 NOTE — Discharge Instructions (Signed)

## 2024-09-16 NOTE — Op Note (Signed)
 MEDICAL RECORD NO.: [id number]      FACILITY:  [facility]      PHYSICIAN:  [name]  DATE OF BIRTH:  December 03, 1945     DATE OF PROCEDURE:  09/16/2024                                 OPERATIVE REPORT         PREOPERATIVE DIAGNOSIS: right hip osteoarthritis.      POSTOPERATIVE DIAGNOSIS:  right hip osteoarthritis.      PROCEDURE:  right total hip replacement through an anterior approach   utilizing DePuy THR system, component size 54 mm pinnacle cup, a size 36+4 neutral   Altrex liner, a size 4 Hi Actis stem with a 36+1.5 delta ceramic   ball.      SURGEON:  Donnice JONETTA. Car, M.D.      ASSISTANT:  Rosina Calin, PA-C     ANESTHESIA:  Spinal.      SPECIMENS:  None.      COMPLICATIONS:  None.      BLOOD LOSS:  150 cc     DRAINS:  None.      INDICATION OF THE PROCEDURE:  Joanne Mendoza is a 79 y.o. female who had   presented to office for evaluation of right hip pain.  Radiographs revealed   progressive degenerative changes with bone-on-bone   articulation of the  hip joint, including subchondral cystic changes and osteophytes.  The patient had painful limited range of   motion significantly affecting their overall quality of life and function.  The patient was failing to    respond to conservative measures including medications and/or injections and activity modification and at this point was ready   to proceed with more definitive measures.  Consent was obtained for   benefit of pain relief.  Specific risks of infection, DVT, component   failure, dislocation, neurovascular injury, and need for revision surgery were reviewed in the office as well discussion of   the anterior versus posterior approach were reviewed.     PROCEDURE IN DETAIL:  The patient was brought to operative theater.   Once adequate anesthesia, preoperative antibiotics, 2 gm of Ancef , 1 gm of Tranexamic Acid , and 10 mg of Decadron  were administered, the patient was positioned supine on  the [employer] table.  Once the patient was safely positioned with adequate padding and protections of bony prominences we predraped out the hip, and used fluoroscopy to confirm orientation of the pelvis.      The right hip was then prepped and draped from proximal iliac crest to   mid thigh with a shower curtain technique.      Time-out was performed identifying the patient, planned procedure, and the appropriate extremity.     An incision was then made 2 cm lateral to the   anterior superior iliac spine extending over the orientation of the   tensor fascia lata muscle and sharp dissection was carried down to the   fascia of the muscle.      The fascia was then incised.  The muscle belly was identified and swept   laterally and retractor placed along the superior neck.  Following   cauterization of the circumflex vessels and removing some pericapsular   fat, a second cobra retractor was placed on the inferior neck.  A T-capsulotomy was made along the line of the   superior neck  to the trochanteric fossa, then extended proximally and   distally.  Tag sutures were placed and the retractors were then placed   intracapsular.  We then identified the trochanteric fossa and   orientation of my neck cut and then made a neck osteotomy with the femur on traction.  The femoral   head was removed without difficulty or complication.  Traction was let   off and retractors were placed posterior and anterior around the   acetabulum.      The labrum and foveal tissue were debrided.  I began reaming with a 47 mm   reamer and reamed up to 53 mm reamer with good bony bed preparation and a 54 mm  cup was chosen.  The final 54 mm Pinnacle cup was then impacted under fluoroscopy to confirm the depth of penetration and orientation with respect to   Abduction and forward flexion.  A screw was placed into the ilium followed by the hole eliminator.  The final   36+4 neutral Altrex liner was impacted with good  visualized rim fit.  The cup was positioned anatomically within the acetabular portion of the pelvis.      At this point, the femur was rolled to 100 degrees.  Further capsule was   released off the inferior aspect of the femoral neck.  I then   released the superior capsule proximally.  With the leg in a neutral position the hook was placed laterally   along the femur under the vastus lateralis origin and elevated manually and then held in position using the hook attachment on the bed.  The leg was then extended and adducted with the leg rolled to 100   degrees of external rotation.  Retractors were placed along the medial calcar and posteriorly over the greater trochanter.  Once the proximal femur was fully   exposed, I used a box osteotome to set orientation.  I then began   broaching with the starting chili pepper broach and passed this by hand and then broached up to 4.  With the 4 broach in place I chose a high offset neck and did several trial reductions.  The offset was appropriate, leg lengths   appeared to be equal best matched with the +1.5 head ball trial confirmed radiographically.   Given these findings, I went ahead and dislocated the hip, repositioned all   retractors and positioned the right hip in the extended and abducted position.  The final 4 Hi Actis stem was   chosen and it was impacted down to the level of neck cut.  Based on this   and the trial reductions, a final 36+1.5 delta ceramic ball was chosen and   impacted onto a clean and dry trunnion, and the hip was reduced.  The   hip had been irrigated throughout the case again at this point.  I did   reapproximate the superior capsular leaflet to the anterior leaflet   using #1 Vicryl.  The fascia of the   tensor fascia lata muscle was then reapproximated using #1 Vicryl and #0 Stratafix sutures.  The   remaining wound was closed with 2-0 Vicryl and running 4-0 Monocryl.   The hip was cleaned, dried, and dressed sterilely  using Dermabond and   Aquacel dressing.  The patient was then brought   to recovery room in stable condition tolerating the procedure well.    PA Patti was present for the entirety of the case involved from   preoperative positioning,  perioperative retractor management, general   facilitation of the case, as well as primary wound closure as assistant.            Donnice CORDOBA Ernie, M.D.        09/16/2024 1:41 PM

## 2024-09-16 NOTE — Interval H&P Note (Signed)
 History and Physical Interval Note:  09/16/2024 11:07 AM  Joanne Mendoza  has presented today for surgery, with the diagnosis of Right hip osteoarthritis.  The various methods of treatment have been discussed with the patient and family. After consideration of risks, benefits and other options for treatment, the patient has consented to  Procedures: ARTHROPLASTY, HIP, TOTAL, ANTERIOR APPROACH (Right) as a surgical intervention.  The patient's history has been reviewed, patient examined, no change in status, stable for surgery.  I have reviewed the patient's chart and labs.  Questions were answered to the patient's satisfaction.     [name]

## 2024-09-16 NOTE — H&P (Signed)
 TOTAL HIP ADMISSION H&P  Patient is admitted for right total hip arthroplasty.  Therapy Plans: HEP Disposition: Home with sons Planned DVT Prophylaxis: aspirin  81mg  BID DME needed: none PCP: Dr. Micheal - clearance received TXA: IV Allergies: PCN - childhood Anesthesia Concerns: none BMI: 33.8 Last HgbA1c: 6.0%   Other: - Staying overnight - celebrex , tramadol , tylenol , methocarbamol , sennakot - Recent left THA - did pretty well, but having mild weakness in left ankle dorsiflexion - Lost her mom last week - no hx of VTE or cancer   Subjective:  Chief Complaint: Right hip pain  HPI: Joanne Mendoza, 79 y.o. female, has a history of pain and functional disability in the right hip due to arthritis and patient has failed non-surgical conservative treatments for greater than 12 weeks to include NSAID's and/or analgesics and activity modification. Onset of symptoms was gradual, starting 2 years ago with gradual worsening course since that time. The patient noted no prior surgeries on the right  hip. Patient currently rates pain in the right hip at 8 out of 10 with activity. Patient has night pain, worsening of pain with activity and weight bearing, pain that interfers with activities of daily living, and pain with passive range of motion. Patient has evidence of joint space narrowing by imaging studies. This condition presents safety issues increasing the risk of falls. There is no current active infection.  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   Hyperlipidemia associated with type 2 diabetes mellitus (HCC) 10/02/2018   White coat hypertension 09/07/2013   Severe obesity (BMI >= 40) (HCC) 05/21/2013   Type 2 diabetes mellitus with hyperglycemia (HCC) 05/20/2013   Essential hypertension 12/12/2008   NONSPECIFIC ABN FINDING RAD & OTH EXAM GU ORGAN 12/12/2008    Past Medical History:  Diagnosis Date   Arthritis    Diabetes  mellitus without complication (HCC)    Hyperlipidemia    Hypertension     Past Surgical History:  Procedure Laterality Date   broken arm     CESAREAN SECTION     x 2    TOTAL HIP ARTHROPLASTY Left 04/20/2024   Procedure: ARTHROPLASTY, HIP, TOTAL, ANTERIOR APPROACH;  Surgeon: Ernie Cough, MD;  Location: [facility];  Service: Orthopedics;  Laterality: Left;    Prior to Admission medications  Medication Sig Start Date End Date Taking? Authorizing Provider  acetaminophen  (TYLENOL ) 500 MG tablet Take 500 mg by mouth every 6 (six) hours as needed for moderate pain (pain score 4-6).   Yes [provider]  amLODipine  (NORVASC ) 5 MG tablet TAKE 1 TABLET(5 MG) BY MOUTH DAILY 08/25/24  Yes Burchette, Wolm ORN, MD  aspirin  81 MG chewable tablet Chew 81 mg by mouth daily as needed for mild pain (pain score 1-3).   Yes [provider]  atorvastatin  (LIPITOR) 40 MG tablet TAKE 1 TABLET(40 MG) BY MOUTH DAILY 08/25/24  Yes Burchette, Wolm ORN, MD  clindamycin (CLEOCIN) 300 MG capsule Take 900 mg by mouth See admin instructions. 1 hour before dental appointment 08/10/24  Yes [provider]  losartan -hydrochlorothiazide  (HYZAAR) 100-12.5 MG tablet Take 1 tablet by mouth daily. 10/01/23  Yes Burchette, Wolm ORN, MD  metFORMIN  (GLUCOPHAGE ) 500 MG tablet TAKE 2 TABLETS BY MOUTH TWICE DAILY 05/25/24  Yes Burchette, Wolm ORN, MD  metoprolol  succinate (TOPROL -XL) 50 MG 24 hr tablet TAKE 1 TABLET(50 MG) BY MOUTH EVERY DAY WITH OR IMMEDIATELY FOLLOWING A MEAL 04/07/24  Yes Burchette, Bruce  W, MD  chlorhexidine  (HIBICLENS ) 4 % external liquid Apply 15 mLs (1 tablespoon) topically as directed daily for 5 days then every other week for 6 weeks. Patient not taking: Reported on 09/07/2024 04/21/24   Patti Rosina SAUNDERS, PA-C  methocarbamol  (ROBAXIN ) 500 MG tablet Take 1 tablet (500 mg total) by mouth every 6 (six) hours as needed for muscle spasms (thigh pain). Patient not taking: Reported on 09/07/2024 04/21/24    Patti Rosina SAUNDERS, PA-C  polyethylene glycol powder (GLYCOLAX /MIRALAX ) 17 GM/SCOOP powder Take 17 g (1 capful) dissolved in water  by mouth 2 (two) times daily. Patient not taking: Reported on 09/07/2024 04/21/24   Patti Rosina SAUNDERS, PA-C  PREVIDENT 5000 PLUS 1.1 % CREA dental cream Place 1 Application onto teeth daily. Patient not taking: Reported on 09/07/2024 04/21/22   [provider]  traMADol  (ULTRAM ) 50 MG tablet Take 1 tablet (50 mg total) by mouth every 6 (six) hours as needed. Patient not taking: Reported on 09/07/2024 04/21/24 04/21/25  Patti Rosina SAUNDERS, PA-C    Allergies[1]  Social History   Socioeconomic History   Marital status: Divorced    Spouse name: Not on file   Number of children: Not on file   Years of education: Not on file   Highest education level: Not on file  Occupational History   Occupation: retired  Tobacco Use   Smoking status: Never   Smokeless tobacco: Never  Vaping Use   Vaping status: Never Used  Substance and Sexual Activity   Alcohol use: No    Comment: 4 times a year wine   Drug use: No   Sexual activity: Not on file  Other Topics Concern   Not on file  Social History Narrative   Not on file   Social Drivers of Health   Tobacco Use: Low Risk (09/10/2024)   Patient History    Smoking Tobacco Use: Never    Smokeless Tobacco Use: Never    Passive Exposure: Not on file  Financial Resource Strain: Not on file  Food Insecurity: No Food Insecurity (04/20/2024)   Epic    Worried About Programme Researcher, Broadcasting/film/video in the Last Year: Never true    Ran Out of Food in the Last Year: Never true  Transportation Needs: No Transportation Needs (04/20/2024)   Epic    Lack of Transportation (Medical): No    Lack of Transportation (Non-Medical): No  Physical Activity: Not on file  Stress: Not on file  Social Connections: Moderately Integrated (04/20/2024)   Social Connection and Isolation Panel    Frequency of Communication with Friends and Family: More than  three times a week    Frequency of Social Gatherings with Friends and Family: More than three times a week    Attends Religious Services: More than 4 times per year    Active Member of [employer] or Organizations: Yes    Attends Banker Meetings: 1 to 4 times per year    Marital Status: Divorced  Intimate Partner Violence: Not At Risk (04/20/2024)   Epic    Fear of Current or Ex-Partner: No    Emotionally Abused: No    Physically Abused: No    Sexually Abused: No  Depression (PHQ2-9): Low Risk (09/23/2023)   Depression (PHQ2-9)    PHQ-2 Score: 0  Alcohol Screen: Not on file  Housing: Low Risk (04/20/2024)   Epic    Unable to Pay for Housing in the Last Year: No    Number of Times Moved in  the Last Year: 0    Homeless in the Last Year: No  Utilities: Not At Risk (04/20/2024)   Epic    Threatened with loss of utilities: No  Health Literacy: Not on file    Tobacco Use: Low Risk (09/10/2024)   Patient History    Smoking Tobacco Use: Never    Smokeless Tobacco Use: Never    Passive Exposure: Not on file   Social History   Substance and Sexual Activity  Alcohol Use No   Comment: 4 times a year wine    Family History  Problem Relation Age of Onset   Alcohol abuse Father    Hyperlipidemia Other        Grandparent    Hypertension Mother    Colon cancer Paternal Uncle    Pancreatic cancer Paternal Uncle     Review Of Systems: Constitutional: Constitutional: no fever, chills, night sweats, or significant weight loss. Cardiovascular: Cardiovascular: no palpitations or chest pain. Respiratory: Respiratory: no cough or shortness of breath and No COPD. Gastrointestinal: Gastrointestinal: no vomiting or nausea. Musculoskeletal: Musculoskeletal: Joint Pain and swelling in Joints. Neurologic: Neurologic: no numbness, tingling, or difficulty with balance.   Objective:  Physical Exam: Left hip exam: No wound concerns noted or reported Passive range of motion left hip is  fluid and pain-free from 20 degrees of internal to 30 degrees of external rotation Active hip flexion over 130 degrees Right hip exam: Painful and limited hip flexion internal rotation with pelvic tilting over 5 degrees with external rotation 20 degrees   Vital signs in last 24 hours:    Imaging Review Plain radiographs demonstrate severe degenerative joint disease of the right hip. The bone quality appears to be adequate for age and reported activity level.  Assessment/Plan:  End stage arthritis, right hip  The patient history, physical examination, clinical judgement of the provider and imaging studies are consistent with end stage degenerative joint disease of the right hip and total hip arthroplasty is deemed medically necessary. The treatment options including medical management, injection therapy, arthroscopy and arthroplasty were discussed at length. The risks and benefits of total hip arthroplasty were presented and reviewed. The risks due to aseptic loosening, infection, stiffness, dislocation/subluxation, thromboembolic complications and other imponderables were discussed. The patient acknowledged the explanation, agreed to proceed with the plan and consent was signed. Patient is being admitted for inpatient treatment for surgery, pain control, PT, OT, prophylactic antibiotics, VTE prophylaxis, progressive ambulation and ADLs and discharge planning.The patient is planning to be discharged home.  Rosina Calin, PA-C Orthopedic Surgery [facility] [place] [phone]      [1]  Allergies Allergen Reactions   Darvon-N [Propoxyphene]     Made her pain worse   Penicillins Hives

## 2024-09-17 ENCOUNTER — Encounter (HOSPITAL_COMMUNITY): Payer: Self-pay | Admitting: Orthopedic Surgery

## 2024-09-17 ENCOUNTER — Other Ambulatory Visit (HOSPITAL_COMMUNITY): Payer: Self-pay

## 2024-09-17 DIAGNOSIS — M1611 Unilateral primary osteoarthritis, right hip: Secondary | ICD-10-CM | POA: Diagnosis not present

## 2024-09-17 LAB — CBC
HCT: 29.3 % — ABNORMAL LOW (ref 36.0–46.0)
Hemoglobin: 9.9 g/dL — ABNORMAL LOW (ref 12.0–15.0)
MCH: 31.2 pg (ref 26.0–34.0)
MCHC: 33.8 g/dL (ref 30.0–36.0)
MCV: 92.4 fL (ref 80.0–100.0)
Platelets: 234 K/uL (ref 150–400)
RBC: 3.17 MIL/uL — ABNORMAL LOW (ref 3.87–5.11)
RDW: 14.1 % (ref 11.5–15.5)
WBC: 11.5 K/uL — ABNORMAL HIGH (ref 4.0–10.5)
nRBC: 0 % (ref 0.0–0.2)

## 2024-09-17 LAB — BASIC METABOLIC PANEL WITH GFR
Anion gap: 7 (ref 5–15)
BUN: 13 mg/dL (ref 8–23)
CO2: 26 mmol/L (ref 22–32)
Calcium: 9.2 mg/dL (ref 8.9–10.3)
Chloride: 102 mmol/L (ref 98–111)
Creatinine, Ser: 0.78 mg/dL (ref 0.44–1.00)
GFR, Estimated: >60 mL/min
Glucose, Bld: 187 mg/dL — ABNORMAL HIGH (ref 70–99)
Potassium: 4.2 mmol/L (ref 3.5–5.1)
Sodium: 134 mmol/L — ABNORMAL LOW (ref 135–145)

## 2024-09-17 LAB — GLUCOSE, CAPILLARY: Glucose-Capillary: 136 mg/dL — ABNORMAL HIGH (ref 70–99)

## 2024-09-17 MED ORDER — CELECOXIB 200 MG PO CAPS
200.0000 mg | ORAL_CAPSULE | Freq: Every day | ORAL | Status: DC
  Administered 2024-09-17: 200 mg via ORAL
  Filled 2024-09-17: qty 1

## 2024-09-17 MED ORDER — CELECOXIB 200 MG PO CAPS
200.0000 mg | ORAL_CAPSULE | Freq: Every day | ORAL | 0 refills | Status: AC
  Filled 2024-09-17: qty 30, 30d supply, fill #0

## 2024-09-17 MED ORDER — ASPIRIN 81 MG PO CHEW
81.0000 mg | CHEWABLE_TABLET | Freq: Two times a day (BID) | ORAL | 0 refills | Status: AC
  Filled 2024-09-17: qty 56, 28d supply, fill #0

## 2024-09-17 MED ORDER — SENNA 8.6 MG PO TABS
2.0000 | ORAL_TABLET | Freq: Every day | ORAL | 0 refills | Status: AC
  Filled 2024-09-17: qty 28, 14d supply, fill #0

## 2024-09-17 MED ORDER — METHOCARBAMOL 500 MG PO TABS
500.0000 mg | ORAL_TABLET | Freq: Four times a day (QID) | ORAL | 0 refills | Status: AC | PRN
  Filled 2024-09-17: qty 40, 10d supply, fill #0

## 2024-09-17 MED ORDER — TRAMADOL HCL 50 MG PO TABS
50.0000 mg | ORAL_TABLET | Freq: Four times a day (QID) | ORAL | 0 refills | Status: AC | PRN
  Filled 2024-09-17: qty 20, 3d supply, fill #0

## 2024-09-17 NOTE — Progress Notes (Signed)
 Discharge meds in a secure bag delivered to patient by this RN

## 2024-09-17 NOTE — Evaluation (Signed)
 Physical Therapy Evaluation Patient Details Name: Joanne Mendoza MRN: [id number] DOB: 28-Feb-1946 Today's Date: 09/17/2024  History of Present Illness  Pt s/p R THR and with hx of L THR and DM  Clinical Impression  Pt s/p R THR and presents with decreased R LE strength/ROM and post op pain limiting functional mobility.  Pt should progress to dc home with family assist.  This am, pt HEP initiated with written instruction provided for pt review.   OOB deferred at pt request upon arrival of bfast.  Will follow up with gait training and stairs prior to dc and after arrival of family.          If plan is discharge home, recommend the following: A little help with walking and/or transfers;A little help with bathing/dressing/bathroom;Assistance with cooking/housework;Assist for transportation;Help with stairs or ramp for entrance   Can travel by private vehicle        Equipment Recommendations None recommended by PT  Recommendations for Other Services       Functional Status Assessment Patient has had a recent decline in their functional status and demonstrates the ability to make significant improvements in function in a reasonable and predictable amount of time.     Precautions / Restrictions Precautions Precautions: Fall Restrictions Weight Bearing Restrictions Per Provider Order: Yes RLE Weight Bearing Per Provider Order: Weight bearing as tolerated      Mobility  Bed Mobility                    Transfers                        Ambulation/Gait                  Stairs            Wheelchair Mobility     Tilt Bed    Modified Rankin (Stroke Patients Only)       Balance                                             Pertinent Vitals/Pain Pain Assessment Pain Assessment: 0-10 Pain Score: 4  Pain Location: R hip Pain Descriptors / Indicators: Aching, Sore Pain Intervention(s): Limited activity within patient's  tolerance, Monitored during session, Premedicated before session, Ice applied    Home Living Family/patient expects to be discharged to:: Private residence Living Arrangements: Alone Available Help at Discharge: Family;Available 24 hours/day Type of Home: House Home Access: Stairs to enter Entrance Stairs-Rails: Can reach both;Left;Right Entrance Stairs-Number of Steps: 3   Home Layout: Able to live on main level with bedroom/bathroom;One level Home Equipment: Grab bars - tub/shower;Cane - single Librarian, Academic (2 wheels)      Prior Function Prior Level of Function : Independent/Modified Independent                     Extremity/Trunk Assessment   Upper Extremity Assessment Upper Extremity Assessment: Overall WFL for tasks assessed    Lower Extremity Assessment Lower Extremity Assessment: RLE deficits/detail RLE Deficits / Details: 2+/5 strength at hip with AAROM at hip to 90 flex and 20 abd       Communication   Communication Communication: No apparent difficulties    Cognition Arousal: Alert Behavior During Therapy: WFL for tasks assessed/performed   PT - Cognitive impairments: No  apparent impairments                         Following commands: Intact       Cueing Cueing Techniques: Verbal cues, Gestural cues     General Comments      Exercises Total Joint Exercises Ankle Circles/Pumps: AROM, Both, 15 reps, Supine Quad Sets: AROM, Both, 10 reps, Supine Heel Slides: AAROM, Right, 20 reps, Supine Hip ABduction/ADduction: AAROM, Right, 15 reps, Supine   Assessment/Plan    PT Assessment Patient needs continued PT services  PT Problem List Decreased strength;Decreased range of motion;Decreased activity tolerance;Decreased balance;Decreased mobility;Decreased knowledge of use of DME;Pain       PT Treatment Interventions DME instruction;Gait training;Stair training;Functional mobility training;Therapeutic activities;Therapeutic  exercise;Balance training;Patient/family education    PT Goals (Current goals can be found in the Care Plan section)  Acute Rehab PT Goals Patient Stated Goal: Regain IND PT Goal Formulation: With patient Time For Goal Achievement: 09/24/24 Potential to Achieve Goals: Good    Frequency 7X/week     Co-evaluation               AM-PAC PT 6 Clicks Mobility  Outcome Measure Help needed turning from your back to your side while in a flat bed without using bedrails?: A Little Help needed moving from lying on your back to sitting on the side of a flat bed without using bedrails?: A Little Help needed moving to and from a bed to a chair (including a wheelchair)?: A Little Help needed standing up from a chair using your arms (e.g., wheelchair or bedside chair)?: A Little Help needed to walk in hospital room?: A Little Help needed climbing 3-5 steps with a railing? : A Lot 6 Click Score: 17    End of Session Equipment Utilized During Treatment: Gait belt Activity Tolerance: Patient tolerated treatment well Patient left: in bed;with call bell/phone within reach;with bed alarm set Nurse Communication: Mobility status PT Visit Diagnosis: Difficulty in walking, not elsewhere classified (R26.2)    Time: 9154-9085 PT Time Calculation (min) (ACUTE ONLY): 29 min   Charges:   PT Evaluation $PT Eval Low Complexity: 1 Low PT Treatments $Therapeutic Exercise: 8-22 mins PT General Charges $$ ACUTE PT VISIT: 1 Visit         [facility] PT Acute Rehabilitation Services Office [phone]   Joanne Mendoza 09/17/2024, 1:52 PM

## 2024-09-17 NOTE — TOC Transition Note (Signed)
 Transition of Care [facility]) - Discharge Note   Patient Details  Name: Joanne Mendoza MRN: [id number] Date of Birth: 10/06/1945  [facility] CM/SW Contact:  Alfonse JONELLE Rex, RN Phone Number: 09/17/2024, 9:36 AM   Clinical Narrative:   Admitted for scheduled R THA on 09/16/24, dc therapy HEP, has RW. No CM needs     Final next level of care: Home/Self Care Barriers to Discharge: No Barriers Identified   Patient Goals and CMS Choice Patient states their goals for this hospitalization and ongoing recovery are:: return home          Discharge Placement                       Discharge Plan and Services Additional resources added to the After Visit Summary for                  DME Arranged: N/A DME Agency: NA                  Social Drivers of Health (SDOH) Interventions SDOH Screenings   Food Insecurity: No Food Insecurity (09/16/2024)  Housing: Low Risk (09/16/2024)  Transportation Needs: No Transportation Needs (09/16/2024)  Utilities: Not At Risk (09/16/2024)  Depression (PHQ2-9): Low Risk (09/23/2023)  Social Connections: Moderately Integrated (09/16/2024)  Tobacco Use: Low Risk (09/16/2024)     Readmission Risk Interventions     No data to display

## 2024-09-17 NOTE — Progress Notes (Signed)
" ° °  Subjective: 1 Day Post-Op Procedures (LRB): ARTHROPLASTY, HIP, TOTAL, ANTERIOR APPROACH (Right) Patient reports pain as mild.   Patient seen in rounds with Dr. Ernie. Patient is resting in bed on exam this morning. No acute events overnight. Foley catheter removed. Patient has not been up with PT yet.  We will start therapy today.   Objective: Vital signs in last 24 hours: Temp:  [96.9 F (36.1 C)-98.1 F (36.7 C)] 98 F (36.7 C) (01/16 0458) Pulse Rate:  [58-77] 63 (01/16 0458) Resp:  [12-18] 17 (01/16 0458) BP: (114-143)/(55-78) 119/68 (01/16 0458) SpO2:  [91 %-98 %] 95 % (01/16 0458) Weight:  [82.6 kg-83 kg] 82.6 kg (01/15 1701)  Intake/Output from previous day:  Intake/Output Summary (Last 24 hours) at 09/17/2024 0748 Last data filed at 09/17/2024 0630 Gross per 24 hour  Intake 3345 ml  Output 2350 ml  Net 995 ml     Intake/Output this shift: No intake/output data recorded.  Labs: Recent Labs    09/17/24 0343  HGB 9.9*   Recent Labs    09/17/24 0343  WBC 11.5*  RBC 3.17*  HCT 29.3*  PLT 234   Recent Labs    09/17/24 0343  NA 134*  K 4.2  CL 102  CO2 26  BUN 13  CREATININE 0.78  GLUCOSE 187*  CALCIUM  9.2   No results for input(s): LABPT, INR in the last 72 hours.  Exam: General - Patient is Alert and Oriented Extremity - Neurologically intact Sensation intact distally Intact pulses distally Dorsiflexion/Plantar flexion intact Dressing - dressing C/D/I Motor Function - intact, moving foot and toes well on exam.   Past Medical History:  Diagnosis Date   Arthritis    Diabetes mellitus without complication (HCC)    Hyperlipidemia    Hypertension     Assessment/Plan: 1 Day Post-Op Procedures (LRB): ARTHROPLASTY, HIP, TOTAL, ANTERIOR APPROACH (Right) Principal Problem:   S/P total right hip arthroplasty  Estimated body mass index is 31.24 kg/m as calculated from the following:   Height as of this encounter: 5' 4 (1.626 m).    Weight as of this encounter: 82.6 kg. Advance diet Up with therapy D/C IV fluids  DVT Prophylaxis - Aspirin  Weight bearing as tolerated.  Hgb stable at 9.9 this AM  She reports minimal pain today, and has only had tylenol /methocarbamol  so far  Plan is to go Home after hospital stay. Plan for discharge today after meeting goals with therapy. Follow up in the office in 2 weeks.   Rosina Calin, PA-C Orthopedic Surgery [phone] 09/17/2024, 7:48 AM  "

## 2024-09-17 NOTE — Inpatient Diabetes Management (Signed)
 Inpatient Diabetes Program Recommendations  AACE/ADA: New Consensus Statement on Inpatient Glycemic Control   Target Ranges:  Prepandial:   less than 140 mg/dL      Peak postprandial:   less than 180 mg/dL (1-2 hours)      Critically ill patients:  140 - 180 mg/dL    Latest Reference Range & Units 09/17/24 03:43  Glucose 70 - 99 mg/dL 812 (H)    Latest Reference Range & Units 09/16/24 11:14 09/16/24 13:05 09/16/24 15:04 09/16/24 17:12 09/16/24 21:33  Glucose-Capillary 70 - 99 mg/dL 862 (H) 865 (H) 867 (H) 163 (H) 262 (H)   Review of Glycemic Control  Diabetes history: DM2 Outpatient Diabetes medications: Metformin  1000 mg BID Current orders for Inpatient glycemic control: Metformin  1000 mg BID  Inpatient Diabetes Program Recommendations:    Insulin : Patient received Decadron  8 mg x1 on 1/15 which is contributing to hyperglycemia. While inpatient, please consider ordering Novolog  0-9 units TID with meals and Novolog  0-5 units at bedtime.  Thanks, Earnie Gainer, RN, MSN, CDCES Diabetes Coordinator Inpatient Diabetes Program [phone] (Team Pager from 8am to 5pm)

## 2024-09-17 NOTE — Progress Notes (Signed)
 Physical Therapy Treatment Patient Details Name: Joanne Mendoza MRN: [id number] DOB: [birth year] Today's Date: 09/17/2024   History of Present Illness Pt s/p R THR and with hx of L THR and DM    PT Comments  Pt progressing well with mobility including up to ambulate in hall and negotiated stairs.  Pt reviewed written HEP and car transfers.  Sons present for session.  Pt eager for dc home this date.    If plan is discharge home, recommend the following: A little help with walking and/or transfers;A little help with bathing/dressing/bathroom;Assistance with cooking/housework;Assist for transportation;Help with stairs or ramp for entrance   Can travel by private vehicle        Equipment Recommendations  None recommended by PT    Recommendations for Other Services       Precautions / Restrictions Precautions Precautions: Fall Restrictions Weight Bearing Restrictions Per Provider Order: Yes RLE Weight Bearing Per Provider Order: Weight bearing as tolerated     Mobility  Bed Mobility               General bed mobility comments: Pt up with nursing and now in chair    Transfers Overall transfer level: Needs assistance Equipment used: Rolling walker (2 wheels) Transfers: Sit to/from Stand Sit to Stand: Contact guard assist, Supervision           General transfer comment: cues for LE management and use of UEs to self assist    Ambulation/Gait Ambulation/Gait assistance: Min assist, Contact guard assist Gait Distance (Feet): 100 Feet Assistive device: Rolling walker (2 wheels) Gait Pattern/deviations: Step-to pattern, Step-through pattern, Decreased step length - right, Decreased step length - left, Shuffle, Trunk flexed       General Gait Details: cues for posture, position from RW and initial sequence   Stairs Stairs: Yes Stairs assistance: Min assist Stair Management: Two rails, Forwards Number of Stairs: 5 General stair comments: cues for sequence; son  assisting   Wheelchair Mobility     Tilt Bed    Modified Rankin (Stroke Patients Only)       Balance Overall balance assessment: Needs assistance Sitting-balance support: No upper extremity supported, Feet supported Sitting balance-Leahy Scale: Good     Standing balance support: No upper extremity supported Standing balance-Leahy Scale: Fair                              Hotel Manager: No apparent difficulties  Cognition Arousal: Alert Behavior During Therapy: WFL for tasks assessed/performed   PT - Cognitive impairments: No apparent impairments                         Following commands: Intact      Cueing Cueing Techniques: Verbal cues, Gestural cues  Exercises Total Joint Exercises Ankle Circles/Pumps: AROM, Both, 15 reps, Supine Quad Sets: AROM, Both, 10 reps, Supine Heel Slides: AAROM, Right, 20 reps, Supine Hip ABduction/ADduction: AAROM, Right, 15 reps, Supine    General Comments        Pertinent Vitals/Pain Pain Assessment Pain Assessment: 0-10 Pain Score: 4  Pain Location: R hip Pain Descriptors / Indicators: Aching, Sore Pain Intervention(s): Limited activity within patient's tolerance, Monitored during session, Premedicated before session    Home Living Family/patient expects to be discharged to:: Private residence Living Arrangements: Alone Available Help at Discharge: Family;Available 24 hours/day Type of Home: House Home Access: Stairs to enter Entrance Stairs-Rails: Can  reach both;Left;Right Entrance Stairs-Number of Steps: 3   Home Layout: Able to live on main level with bedroom/bathroom;One level Home Equipment: Grab bars - tub/shower;Cane - single Librarian, Academic (2 wheels)      Prior Function            PT Goals (current goals can now be found in the care plan section) Acute Rehab PT Goals Patient Stated Goal: Regain IND PT Goal Formulation: With patient Time For Goal  Achievement: 09/24/24 Potential to Achieve Goals: Good Progress towards PT goals: Progressing toward goals    Frequency    7X/week      PT Plan      Co-evaluation              AM-PAC PT 6 Clicks Mobility   Outcome Measure  Help needed turning from your back to your side while in a flat bed without using bedrails?: A Little Help needed moving from lying on your back to sitting on the side of a flat bed without using bedrails?: A Little Help needed moving to and from a bed to a chair (including a wheelchair)?: A Little Help needed standing up from a chair using your arms (e.g., wheelchair or bedside chair)?: A Little Help needed to walk in hospital room?: A Little Help needed climbing 3-5 steps with a railing? : A Little 6 Click Score: 18    End of Session Equipment Utilized During Treatment: Gait belt Activity Tolerance: Patient tolerated treatment well Patient left: in chair;with call bell/phone within reach;with family/visitor present Nurse Communication: Mobility status PT Visit Diagnosis: Difficulty in walking, not elsewhere classified (R26.2)     Time: 8965-8943 PT Time Calculation (min) (ACUTE ONLY): 22 min  Charges:    $Gait Training: 8-22 mins $Therapeutic Exercise: 8-22 mins PT General Charges $$ ACUTE PT VISIT: 1 Visit                     [facility] PT Acute Rehabilitation Services Office [phone]    Woodrow Dulski 09/17/2024, 2:00 PM

## 2024-09-17 NOTE — Progress Notes (Signed)
 Patient c/o all the alarms going off: Patient educated on continuous pulse ox and disconnected per wishes for sleep and will just monitor the patient more often. Patient c/o uncomfortable pillows causing neck pain and attempted to adjust pillows and roll towel under neck and patient not satisfied. NT went to another unit and obtained neck pillow. Patient c/o staff in and out of the room (nursing/lab) causing inability to sleep. Patient advised of policy to check on patient at least every two hours for her safety and labs draw per MD orders. Will continue to monitor the patient.

## 2024-09-30 ENCOUNTER — Ambulatory Visit: Admitting: Family Medicine

## 2024-09-30 NOTE — Discharge Summary (Signed)
 Patient ID: EFFA YARROW MRN: [id number] DOB/AGE: 77-Jul-1947 77 y.o.  Admit date: 09/16/2024 Discharge date: 09/17/2024  Admission Diagnoses:  Right hip osteoarthritis  Discharge Diagnoses:  Principal Problem:   S/P total right hip arthroplasty   Past Medical History:  Diagnosis Date   Arthritis    Diabetes mellitus without complication (HCC)    Hyperlipidemia    Hypertension     Surgeries: Procedures: ARTHROPLASTY, HIP, TOTAL, ANTERIOR APPROACH on 09/16/2024   Consultants:   Discharged Condition: Improved  Hospital Course: Joanne Mendoza is an 79 y.o. female who was admitted 09/16/2024 for operative treatment ofS/P total right hip arthroplasty. Patient has severe unremitting pain that affects sleep, daily activities, and work/hobbies. After pre-op clearance the patient was taken to the operating room on 09/16/2024 and underwent  Procedures: ARTHROPLASTY, HIP, TOTAL, ANTERIOR APPROACH.    Patient was given perioperative antibiotics:  Anti-infectives (From admission, onward)    Start     Dose/Rate Route Frequency Ordered Stop   09/16/24 1830  ceFAZolin  (ANCEF ) IVPB 2g/100 mL premix        2 g 200 mL/hr over 30 Minutes Intravenous Every 6 hours 09/16/24 1704 09/17/24 0700   09/16/24 1015  ceFAZolin  (ANCEF ) IVPB 2g/100 mL premix        2 g 200 mL/hr over 30 Minutes Intravenous On call to O.R. 09/16/24 1008 09/16/24 1234        Patient was given sequential compression devices, early ambulation, and chemoprophylaxis to prevent DVT. Patient worked with PT and was meeting their goals regarding safe ambulation and transfers.  Patient benefited maximally from hospital stay and there were no complications.    Recent vital signs: No data found.   Recent laboratory studies: No results for input(s): WBC, HGB, HCT, PLT, NA, K, CL, CO2, BUN, CREATININE, GLUCOSE, INR, CALCIUM  in the last 72 hours.  Invalid input(s): PT, 2   Discharge Medications:    Allergies as of 09/17/2024       Reactions   Darvon-n [propoxyphene]    Made her pain worse   Penicillins Hives   Tolerated Cephalosporin Date: 09/17/24.        Medication List     TAKE these medications    acetaminophen  500 MG tablet Commonly known as: TYLENOL  Take 500 mg by mouth every 6 (six) hours as needed for moderate pain (pain score 4-6).   amLODipine  5 MG tablet Commonly known as: NORVASC  TAKE 1 TABLET(5 MG) BY MOUTH DAILY   Aspirin  Low Dose 81 MG chewable tablet Generic drug: aspirin  Chew 1 tablet (81 mg total) by mouth 2 (two) times daily for 28 days. What changed:  when to take this reasons to take this   atorvastatin  40 MG tablet Commonly known as: LIPITOR TAKE 1 TABLET(40 MG) BY MOUTH DAILY   Betasept  Surgical Scrub 4 % external liquid Generic drug: chlorhexidine  Apply 15 mLs (1 tablespoon) topically as directed daily for 5 days then every other week for 6 weeks.   celecoxib  200 MG capsule Commonly known as: CELEBREX  Take 1 capsule (200 mg total) by mouth daily.   clindamycin 300 MG capsule Commonly known as: CLEOCIN Take 900 mg by mouth See admin instructions. 1 hour before dental appointment   losartan -hydrochlorothiazide  100-12.5 MG tablet Commonly known as: HYZAAR Take 1 tablet by mouth daily.   metFORMIN  500 MG tablet Commonly known as: GLUCOPHAGE  TAKE 2 TABLETS BY MOUTH TWICE DAILY   methocarbamol  500 MG tablet Commonly known as: ROBAXIN  Take 1 tablet (500 mg total)  by mouth every 6 (six) hours as needed for muscle spasms (thigh pain).   metoprolol  succinate 50 MG 24 hr tablet Commonly known as: TOPROL -XL TAKE 1 TABLET(50 MG) BY MOUTH EVERY DAY WITH OR IMMEDIATELY FOLLOWING A MEAL   polyethylene glycol powder 17 GM/SCOOP powder Commonly known as: GLYCOLAX /MIRALAX  Take 17 g (1 capful) dissolved in water  by mouth 2 (two) times daily.   PreviDent 5000 Plus 1.1 % Crea dental cream Generic drug: sodium fluoride Place 1 Application  onto teeth daily.   senna 8.6 MG Tabs tablet Commonly known as: SENOKOT Take 2 tablets (17.2 mg total) by mouth at bedtime for 14 days.   traMADol  50 MG tablet Commonly known as: Ultram  Take 1-2 tablets (50-100 mg total) by mouth every 6 (six) hours as needed for severe pain (pain score 7-10). What changed:  how much to take reasons to take this               Discharge Care Instructions  (From admission, onward)           Start     Ordered   09/17/24 0000  Change dressing       Comments: Maintain surgical dressing until follow up in the clinic. If the edges start to pull up, may reinforce with tape. If the dressing is no longer working, may remove and cover with gauze and tape, but must keep the area dry and clean.  Call with any questions or concerns.   09/17/24 0751            Diagnostic Studies: DG HIP UNILAT WITH PELVIS 1V RIGHT Result Date: 09/16/2024 EXAM: 1 VIEW(S) XRAY OF THE RIGHT HIP 09/16/2024 01:38:00 PM COMPARISON: None available. CLINICAL HISTORY: [id number] Surgery, elective [id number] Surgery, elective [phone] FINDINGS: BONES AND JOINTS: Right total hip arthroplasty. Hardware components are in anatomic alignment without signs of periprosthetic fracture or subluxation. Previous left hip arthroplasty device again seen. SOFT TISSUES: Unremarkable. IMPRESSION: 1. Right total hip arthroplasty in anatomic alignment without signs of periprosthetic fracture or subluxation. Electronically signed by: Waddell Calk MD 09/16/2024 03:52 PM EST RP Workstation: [id number]   DG Pelvis Portable Result Date: 09/16/2024 CLINICAL DATA:  Hip arthroplasty EXAM: PORTABLE PELVIS 1-2 VIEWS COMPARISON:  04/20/2024 FINDINGS: Previous left hip replacement. Interval right hip replacement with intact hardware and normal alignment. Gas in the soft tissues consistent with recent surgery. IMPRESSION: Status post right hip replacement with expected postsurgical change. Electronically Signed   By: Luke Bun M.D.   On: 09/16/2024 15:43   DG C-Arm 1-60 Min-No Report Result Date: 09/16/2024 Fluoroscopy was utilized by the requesting physician.  No radiographic interpretation.    Disposition: Discharge disposition: 01-Home or Self Care       Discharge Instructions     Call MD / Call 911   Complete by: As directed    If you experience chest pain or shortness of breath, CALL 911 and be transported to the hospital emergency room.  If you develope a fever above 101 F, pus (white drainage) or increased drainage or redness at the wound, or calf pain, call your surgeon's office.   Change dressing   Complete by: As directed    Maintain surgical dressing until follow up in the clinic. If the edges start to pull up, may reinforce with tape. If the dressing is no longer working, may remove and cover with gauze and tape, but must keep the area dry and clean.  Call with any questions or concerns.  Constipation Prevention   Complete by: As directed    Drink plenty of fluids.  Prune juice may be helpful.  You may use a stool softener, such as Colace (over the counter) 100 mg twice a day.  Use MiraLax  (over the counter) for constipation as needed.   Diet - low sodium heart healthy   Complete by: As directed    Increase activity slowly as tolerated   Complete by: As directed    Weight bearing as tolerated with assist device (walker, cane, etc) as directed, use it as long as suggested by your surgeon or therapist, typically at least 4-6 weeks.   Post-operative opioid taper instructions:   Complete by: As directed    POST-OPERATIVE OPIOID TAPER INSTRUCTIONS: It is important to wean off of your opioid medication as soon as possible. If you do not need pain medication after your surgery it is ok to stop day one. Opioids include: Codeine, Hydrocodone(Norco, Vicodin), Oxycodone (Percocet, oxycontin ) and hydromorphone  amongst others.  Long term and even short term use of opiods can cause: Increased  pain response Dependence Constipation Depression Respiratory depression And more.  Withdrawal symptoms can include Flu like symptoms Nausea, vomiting And more Techniques to manage these symptoms Hydrate well Eat regular healthy meals Stay active Use relaxation techniques(deep breathing, meditating, yoga) Do Not substitute Alcohol to help with tapering If you have been on opioids for less than two weeks and do not have pain than it is ok to stop all together.  Plan to wean off of opioids This plan should start within one week post op of your joint replacement. Maintain the same interval or time between taking each dose and first decrease the dose.  Cut the total daily intake of opioids by one tablet each day Next start to increase the time between doses. The last dose that should be eliminated is the evening dose.      TED hose   Complete by: As directed    Use stockings (TED hose) for 2 weeks on both leg(s).  You may remove them at night for sleeping.        Follow-up Information     Patti Rosina SAUNDERS, PA-C. Go on 09/30/2024.   Specialty: Orthopedic Surgery Why: You are scheduled for a post op appointment on Thursday 09/30/24 at 4:00pm Contact information: [address] [place] [address] [phone]                  Signed: Rosina SAUNDERS Patti 09/30/2024, 10:52 AM

## 2024-10-03 ENCOUNTER — Other Ambulatory Visit: Payer: Self-pay | Admitting: Family Medicine

## 2024-10-04 ENCOUNTER — Other Ambulatory Visit: Payer: Self-pay | Admitting: Family Medicine

## 2024-10-04 DIAGNOSIS — I10 Essential (primary) hypertension: Secondary | ICD-10-CM

## 2024-12-09 ENCOUNTER — Ambulatory Visit
